# Patient Record
Sex: Female | Born: 1960 | Race: Black or African American | Hispanic: No | Marital: Married | Smoking: Never smoker
Health system: Southern US, Community
[De-identification: ages and names within clinical notes are randomized; demographics above are authoritative.]

## PROBLEM LIST (undated history)

## (undated) DIAGNOSIS — E559 Vitamin D deficiency, unspecified: Secondary | ICD-10-CM

## (undated) DIAGNOSIS — F419 Anxiety disorder, unspecified: Secondary | ICD-10-CM

## (undated) DIAGNOSIS — T7840XA Allergy, unspecified, initial encounter: Secondary | ICD-10-CM

## (undated) DIAGNOSIS — O2230 Deep phlebothrombosis in pregnancy, unspecified trimester: Secondary | ICD-10-CM

## (undated) DIAGNOSIS — F329 Major depressive disorder, single episode, unspecified: Secondary | ICD-10-CM

## (undated) DIAGNOSIS — I471 Supraventricular tachycardia, unspecified: Secondary | ICD-10-CM

## (undated) DIAGNOSIS — K219 Gastro-esophageal reflux disease without esophagitis: Secondary | ICD-10-CM

## (undated) DIAGNOSIS — I10 Essential (primary) hypertension: Secondary | ICD-10-CM

## (undated) DIAGNOSIS — K2270 Barrett's esophagus without dysplasia: Secondary | ICD-10-CM

## (undated) DIAGNOSIS — K5792 Diverticulitis of intestine, part unspecified, without perforation or abscess without bleeding: Secondary | ICD-10-CM

## (undated) DIAGNOSIS — R197 Diarrhea, unspecified: Secondary | ICD-10-CM

## (undated) DIAGNOSIS — I499 Cardiac arrhythmia, unspecified: Secondary | ICD-10-CM

## (undated) HISTORY — DX: Supraventricular tachycardia: I47.1

## (undated) HISTORY — DX: Diarrhea, unspecified: R19.7

## (undated) HISTORY — DX: Supraventricular tachycardia, unspecified: I47.10

## (undated) HISTORY — PX: CHOLECYSTECTOMY: SHX55

## (undated) HISTORY — DX: Anxiety disorder, unspecified: F41.9

## (undated) HISTORY — PX: OTHER SURGICAL HISTORY: SHX169

## (undated) HISTORY — DX: Vitamin D deficiency, unspecified: E55.9

## (undated) HISTORY — PX: ABDOMINAL HYSTERECTOMY: SHX81

## (undated) HISTORY — DX: Allergy, unspecified, initial encounter: T78.40XA

## (undated) HISTORY — DX: Major depressive disorder, single episode, unspecified: F32.9

## (undated) HISTORY — DX: Cardiac arrhythmia, unspecified: I49.9

## (undated) HISTORY — PX: WISDOM TOOTH EXTRACTION: SHX21

---

## 1998-09-11 ENCOUNTER — Encounter: Payer: Self-pay | Admitting: Internal Medicine

## 1998-09-11 ENCOUNTER — Ambulatory Visit (HOSPITAL_COMMUNITY): Admission: RE | Admit: 1998-09-11 | Discharge: 1998-09-11 | Payer: Self-pay | Admitting: Internal Medicine

## 1999-03-18 ENCOUNTER — Other Ambulatory Visit: Admission: RE | Admit: 1999-03-18 | Discharge: 1999-03-18 | Payer: Self-pay | Admitting: Internal Medicine

## 1999-04-09 ENCOUNTER — Emergency Department (HOSPITAL_COMMUNITY): Admission: EM | Admit: 1999-04-09 | Discharge: 1999-04-09 | Payer: Self-pay | Admitting: Emergency Medicine

## 1999-04-09 ENCOUNTER — Encounter: Payer: Self-pay | Admitting: Emergency Medicine

## 1999-09-15 ENCOUNTER — Ambulatory Visit (HOSPITAL_COMMUNITY): Admission: RE | Admit: 1999-09-15 | Discharge: 1999-09-15 | Payer: Self-pay | Admitting: Internal Medicine

## 1999-09-15 ENCOUNTER — Encounter: Payer: Self-pay | Admitting: Internal Medicine

## 1999-09-26 ENCOUNTER — Ambulatory Visit (HOSPITAL_COMMUNITY): Admission: RE | Admit: 1999-09-26 | Discharge: 1999-09-26 | Payer: Self-pay | Admitting: Emergency Medicine

## 1999-09-26 ENCOUNTER — Encounter: Payer: Self-pay | Admitting: Emergency Medicine

## 1999-11-27 ENCOUNTER — Encounter: Admission: RE | Admit: 1999-11-27 | Discharge: 1999-11-27 | Payer: Self-pay | Admitting: Internal Medicine

## 1999-11-27 ENCOUNTER — Encounter: Payer: Self-pay | Admitting: Internal Medicine

## 1999-12-18 ENCOUNTER — Encounter: Payer: Self-pay | Admitting: Gastroenterology

## 1999-12-18 ENCOUNTER — Ambulatory Visit (HOSPITAL_COMMUNITY): Admission: RE | Admit: 1999-12-18 | Discharge: 1999-12-18 | Payer: Self-pay | Admitting: Gastroenterology

## 1999-12-22 ENCOUNTER — Encounter (INDEPENDENT_AMBULATORY_CARE_PROVIDER_SITE_OTHER): Payer: Self-pay | Admitting: *Deleted

## 1999-12-22 ENCOUNTER — Ambulatory Visit (HOSPITAL_COMMUNITY): Admission: RE | Admit: 1999-12-22 | Discharge: 1999-12-22 | Payer: Self-pay | Admitting: Gastroenterology

## 2000-02-04 ENCOUNTER — Ambulatory Visit (HOSPITAL_COMMUNITY): Admission: RE | Admit: 2000-02-04 | Discharge: 2000-02-04 | Payer: Self-pay | Admitting: Gastroenterology

## 2000-02-04 ENCOUNTER — Encounter (INDEPENDENT_AMBULATORY_CARE_PROVIDER_SITE_OTHER): Payer: Self-pay | Admitting: *Deleted

## 2000-09-16 ENCOUNTER — Ambulatory Visit (HOSPITAL_COMMUNITY): Admission: RE | Admit: 2000-09-16 | Discharge: 2000-09-16 | Payer: Self-pay | Admitting: Internal Medicine

## 2000-09-16 ENCOUNTER — Encounter: Payer: Self-pay | Admitting: Internal Medicine

## 2001-02-21 ENCOUNTER — Encounter: Payer: Self-pay | Admitting: Emergency Medicine

## 2001-02-21 ENCOUNTER — Inpatient Hospital Stay (HOSPITAL_COMMUNITY): Admission: EM | Admit: 2001-02-21 | Discharge: 2001-02-21 | Payer: Self-pay | Admitting: Emergency Medicine

## 2001-05-05 ENCOUNTER — Other Ambulatory Visit: Admission: RE | Admit: 2001-05-05 | Discharge: 2001-05-05 | Payer: Self-pay | Admitting: Obstetrics and Gynecology

## 2001-09-30 ENCOUNTER — Ambulatory Visit (HOSPITAL_COMMUNITY): Admission: RE | Admit: 2001-09-30 | Discharge: 2001-09-30 | Payer: Self-pay | Admitting: Internal Medicine

## 2001-09-30 ENCOUNTER — Encounter: Payer: Self-pay | Admitting: Internal Medicine

## 2002-02-22 ENCOUNTER — Encounter: Payer: Self-pay | Admitting: Emergency Medicine

## 2002-02-22 ENCOUNTER — Emergency Department (HOSPITAL_COMMUNITY): Admission: EM | Admit: 2002-02-22 | Discharge: 2002-02-22 | Payer: Self-pay | Admitting: Emergency Medicine

## 2002-04-13 ENCOUNTER — Encounter: Payer: Self-pay | Admitting: General Surgery

## 2002-04-13 ENCOUNTER — Encounter: Admission: RE | Admit: 2002-04-13 | Discharge: 2002-04-13 | Payer: Self-pay | Admitting: General Surgery

## 2002-04-21 ENCOUNTER — Encounter: Payer: Self-pay | Admitting: General Surgery

## 2002-04-21 ENCOUNTER — Encounter (INDEPENDENT_AMBULATORY_CARE_PROVIDER_SITE_OTHER): Payer: Self-pay | Admitting: *Deleted

## 2002-04-21 ENCOUNTER — Observation Stay (HOSPITAL_COMMUNITY): Admission: RE | Admit: 2002-04-21 | Discharge: 2002-04-21 | Payer: Self-pay | Admitting: General Surgery

## 2002-10-02 ENCOUNTER — Ambulatory Visit (HOSPITAL_COMMUNITY): Admission: RE | Admit: 2002-10-02 | Discharge: 2002-10-02 | Payer: Self-pay | Admitting: Family Medicine

## 2002-10-02 ENCOUNTER — Encounter: Payer: Self-pay | Admitting: Family Medicine

## 2003-10-03 ENCOUNTER — Ambulatory Visit (HOSPITAL_COMMUNITY): Admission: RE | Admit: 2003-10-03 | Discharge: 2003-10-03 | Payer: Self-pay | Admitting: Family Medicine

## 2003-10-25 ENCOUNTER — Emergency Department (HOSPITAL_COMMUNITY): Admission: EM | Admit: 2003-10-25 | Discharge: 2003-10-25 | Payer: Self-pay | Admitting: Emergency Medicine

## 2004-10-03 ENCOUNTER — Ambulatory Visit (HOSPITAL_COMMUNITY): Admission: RE | Admit: 2004-10-03 | Discharge: 2004-10-03 | Payer: Self-pay | Admitting: Family Medicine

## 2004-10-08 ENCOUNTER — Ambulatory Visit (HOSPITAL_COMMUNITY): Admission: RE | Admit: 2004-10-08 | Discharge: 2004-10-08 | Payer: Self-pay | Admitting: Family Medicine

## 2007-02-26 ENCOUNTER — Emergency Department (HOSPITAL_COMMUNITY): Admission: EM | Admit: 2007-02-26 | Discharge: 2007-02-26 | Payer: Self-pay | Admitting: Emergency Medicine

## 2007-03-06 ENCOUNTER — Emergency Department: Payer: Self-pay | Admitting: Emergency Medicine

## 2007-03-24 ENCOUNTER — Ambulatory Visit: Payer: Self-pay | Admitting: Internal Medicine

## 2007-04-20 ENCOUNTER — Ambulatory Visit: Payer: Self-pay | Admitting: Internal Medicine

## 2007-05-03 ENCOUNTER — Ambulatory Visit: Payer: Self-pay | Admitting: Internal Medicine

## 2007-07-08 ENCOUNTER — Ambulatory Visit: Payer: Self-pay | Admitting: Gastroenterology

## 2008-02-26 ENCOUNTER — Emergency Department: Payer: Self-pay | Admitting: Emergency Medicine

## 2008-02-26 ENCOUNTER — Emergency Department (HOSPITAL_COMMUNITY): Admission: EM | Admit: 2008-02-26 | Discharge: 2008-02-27 | Payer: Self-pay | Admitting: Emergency Medicine

## 2008-07-11 ENCOUNTER — Ambulatory Visit: Payer: Self-pay | Admitting: Internal Medicine

## 2008-08-07 ENCOUNTER — Ambulatory Visit: Payer: Self-pay | Admitting: Internal Medicine

## 2008-08-17 ENCOUNTER — Other Ambulatory Visit: Payer: Self-pay | Admitting: Internal Medicine

## 2008-08-27 ENCOUNTER — Ambulatory Visit: Payer: Self-pay | Admitting: Internal Medicine

## 2008-09-05 ENCOUNTER — Ambulatory Visit: Payer: Self-pay | Admitting: Internal Medicine

## 2008-09-06 ENCOUNTER — Ambulatory Visit: Payer: Self-pay | Admitting: Internal Medicine

## 2008-09-14 ENCOUNTER — Ambulatory Visit: Payer: Self-pay | Admitting: Internal Medicine

## 2008-10-07 ENCOUNTER — Ambulatory Visit: Payer: Self-pay | Admitting: Internal Medicine

## 2009-02-25 ENCOUNTER — Ambulatory Visit: Payer: Self-pay | Admitting: Internal Medicine

## 2009-03-09 HISTORY — PX: COLONOSCOPY: SHX174

## 2009-04-11 ENCOUNTER — Ambulatory Visit: Payer: Self-pay | Admitting: Internal Medicine

## 2009-05-07 ENCOUNTER — Ambulatory Visit: Payer: Self-pay | Admitting: Internal Medicine

## 2009-05-11 ENCOUNTER — Emergency Department: Payer: Self-pay | Admitting: Emergency Medicine

## 2009-06-22 ENCOUNTER — Emergency Department (HOSPITAL_COMMUNITY): Admission: EM | Admit: 2009-06-22 | Discharge: 2009-06-22 | Payer: Self-pay | Admitting: Emergency Medicine

## 2009-08-28 ENCOUNTER — Ambulatory Visit: Payer: Self-pay | Admitting: Internal Medicine

## 2009-09-18 ENCOUNTER — Ambulatory Visit: Payer: Self-pay | Admitting: Internal Medicine

## 2009-11-07 ENCOUNTER — Ambulatory Visit: Payer: Self-pay | Admitting: Internal Medicine

## 2009-12-03 ENCOUNTER — Emergency Department (HOSPITAL_COMMUNITY): Admission: EM | Admit: 2009-12-03 | Discharge: 2009-12-03 | Payer: Self-pay | Admitting: Emergency Medicine

## 2009-12-06 ENCOUNTER — Ambulatory Visit: Payer: Self-pay | Admitting: Internal Medicine

## 2009-12-07 ENCOUNTER — Ambulatory Visit: Payer: Self-pay | Admitting: Internal Medicine

## 2009-12-13 ENCOUNTER — Ambulatory Visit: Payer: Self-pay

## 2010-03-09 HISTORY — PX: BREAST BIOPSY: SHX20

## 2010-05-22 LAB — POCT I-STAT, CHEM 8
BUN: 10 mg/dL (ref 6–23)
Calcium, Ion: 1.21 mmol/L (ref 1.12–1.32)
Chloride: 104 mEq/L (ref 96–112)
Creatinine, Ser: 0.9 mg/dL (ref 0.4–1.2)
Glucose, Bld: 108 mg/dL — ABNORMAL HIGH (ref 70–99)
HCT: 45 % (ref 36.0–46.0)
Hemoglobin: 15.3 g/dL — ABNORMAL HIGH (ref 12.0–15.0)
Potassium: 3.4 mEq/L — ABNORMAL LOW (ref 3.5–5.1)
Sodium: 141 mEq/L (ref 135–145)
TCO2: 25 mmol/L (ref 0–100)

## 2010-05-22 LAB — POCT CARDIAC MARKERS
CKMB, poc: <1 ng/mL — ABNORMAL LOW (ref 1.0–8.0)
Myoglobin, poc: 69.2 ng/mL (ref 12–200)
Troponin i, poc: <0.05 ng/mL (ref 0.00–0.09)

## 2010-05-27 LAB — COMPREHENSIVE METABOLIC PANEL
ALT: 17 U/L (ref 0–35)
AST: 24 U/L (ref 0–37)
Albumin: 3.6 g/dL (ref 3.5–5.2)
Alkaline Phosphatase: 53 U/L (ref 39–117)
BUN: 9 mg/dL (ref 6–23)
CO2: 27 mEq/L (ref 19–32)
Calcium: 8.8 mg/dL (ref 8.4–10.5)
Chloride: 107 mEq/L (ref 96–112)
Creatinine, Ser: 0.81 mg/dL (ref 0.4–1.2)
GFR calc Af Amer: >60 mL/min (ref >60)
GFR calc non Af Amer: >60 mL/min (ref >60)
Glucose, Bld: 110 mg/dL — ABNORMAL HIGH (ref 70–99)
Potassium: 3.7 mEq/L (ref 3.5–5.1)
Sodium: 137 mEq/L (ref 135–145)
Total Bilirubin: 0.3 mg/dL (ref 0.3–1.2)
Total Protein: 7.5 g/dL (ref 6.0–8.3)

## 2010-05-27 LAB — LIPASE, BLOOD: Lipase: 38 U/L (ref 11–59)

## 2010-07-25 NOTE — H&P (Signed)
[facility]  Patient:    JOCI, DRESS Visit Number: [id number] MRN: [id number]          Service Type: MED Location: 3W [id number] 2006 [id number] Attending Physician:  Rudean Hitt Dictated by:   Clovis Pu Patty Sermons, M.D. Admit Date:  02/20/2001   CC:         Merlene Laughter. Renae Gloss, M.D.  Anselmo Rod, M.D.   History and Physical  CHIEF COMPLAINT:  Chest pain.  HISTORY:  This is a 50 year old married black female who is admitted with chest pain through the emergency room.  She had the onset of pain at about 10 p.m. and came to the emergency room around midnight.  She described the discomfort as substernal heaviness which was not pleuritic.  There was radiation to the jaw and to the left shoulder blade and into the left arm. There was associated nausea but no vomiting and no diaphoresis and she was not short of breath.  When she came to the emergency room, her electrocardiogram was normal.  She was given three nitroglycerin with relief of pain.  She does have a past history of known hiatal hernia and peptic ulcer.  She has had a past history of negative Holter monitor.  She has never had stress test or cath.  She saw Dr. Darci Needle in about 1996 and he felt her pain at that time was noncardiac.  She saw Dr. Julieanne Manson two or three years ago and had Holter monitoring which was negative as part of her workup of palpitations.  She does not occasional chest tightness with walking.  She has had no recent palpitations.  She does have a history of high blood pressure and has been on a dose of atenolol 25 mg daily and hydrochlorothiazide 25 mg/triamterene 37.5 mg one daily.  FAMILY HISTORY:  Her family history reveals that her mother died of pneumonia and father died of cancer.  She has a brother with heart failure.  SOCIAL HISTORY:  She is married.  She has two children.  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  She quit smoking  in the early 1980s.  She drinks occasional beer.  ALLERGIES:  She develops hives if she takes very much ASPIRIN.  PAST MEDICAL HISTORY:  Hysterectomy.  REVIEW OF SYSTEMS:  GASTROINTESTINAL:  She has a history of reflux.  She takes Tagamet over the counter p.r.n.  She has had normal bowel movements. GENITOURINARY:  Negative.  NEUROLOGIC:  History of migraine and she takes over-the-counter medications including occasional Goodys powders.  PHYSICAL EXAMINATION:  VITAL SIGNS:  Blood pressure is 110/80.  Pulse is 80, regular.  Respirations are normal.  Her weight is about 240 pounds.  GENERAL:  General appearance revealed a well-developed, well-nourished woman in no distress.  HEENT/NECK:  Negative.  Carotids negative.  Pupils are equal and reactive. Extraocular movements are full.  Mouth and pharynx normal.  The thyroid is not enlarged.  There is no lymphadenopathy.  CHEST:  Clear.  The thorax reveals no spine tenderness.  She does have mild left anterior chest wall tenderness.  HEART:  Quiet precordium without murmur, gallop or rub.  ABDOMEN:  Soft, nontender.  Liver not enlarged.  EXTREMITIES:  Good peripheral pulses.  No phlebitis or edema.  LABORATORY DATA:  Electrocardiogram is normal x 2 two hours apart and we are awaiting the third EKG.  X-ray results are pending.  Laboratory studies so far show  CK-MB of 4.2, troponin of 0.02, potassium is borderline low at 3.5.  Liver function studies are normal.  IMPRESSION: 1. Chest pain, rule out myocardial infarction. 2. History of hiatal hernia with reflux. 3. Hypertensive cardiovascular disease. 4. Exogenous obesity.  DISPOSITION:  We are awaiting a second set of enzymes drawn 12 hours after admission.  If negative, we will probably be able to discharge later today and work her up as an outpatient with a two-day Cardiolite stress test. Dictated by:   Clovis Pu. Patty Sermons, M.D. Attending Physician:  Rudean Hitt DD:  02/21/01 TD:  02/21/01 Job: [id number]

## 2010-07-25 NOTE — Op Note (Signed)
NAME:  Yolanda Brown, Yolanda Brown NO.:  [id number]   MEDICAL RECORD NO.:  [id number]                   PATIENT TYPE:  AMB   LOCATION:  DAY                                  FACILITY:  [facility]   PHYSICIAN:  Adolph Pollack, M.D.            DATE OF BIRTH:  09/12/1960   DATE OF PROCEDURE:  04/21/2002  DATE OF DISCHARGE:                                 OPERATIVE REPORT   PREOPERATIVE DIAGNOSIS:  Symptomatic cholelithiasis.   POSTOPERATIVE DIAGNOSIS:  Chronic calculous cholecystitis.   OPERATION PERFORMED:  Laparoscopic cholecystectomy with intraoperative  cholangiogram.   SURGEON:  Jefry H. Pollyann Kennedy, M.D.   ASSISTANT:  Currie Paris, M.D.   ANESTHESIA:  General.   INDICATIONS FOR PROCEDURE:  The patient is a 50 year old female with very  classic biliary colic and known gallstones.  She presents now for elective  laparoscopic cholecystectomy.  The procedure and the risks were explained to  her preoperatively.   DESCRIPTION OF PROCEDURE:  The patient was seen in the holding room and  brought to the operating room.  Placed supine on the operating table and a  general anesthetic was administered.  Her abdomen was then sterilely prepped  and draped.  Dilute Marcaine was infiltrated  in the subumbilical region and  a small subumbilical incision was made through the skin and subcutaneous  tissue until the fascia was exposed.  A 1.5 cm incision was made in the  midline fascia and a pursestring suture of 0 Vicryl placed around the  fascial edges.  The peritoneal cavity was then entered bluntly and under  direct vision.  A Hasson trocar was introduced into the peritoneal cavity  and a pneumoperitoneum was created by insufflation of CO2 gas.   Next she was placed in a reversed Trendelenburg position and a laparoscope  was introduced.  I examined the area directly below the trocar and no bowel  injury was noted.  An 11 mm trocar was placed in the epigastric  incision and  two 5 mm trocars placed through the right midlateral abdomen.  She was then  tilted slightly right side up.  The fundus of the gallbladder was grasped  and adhesions between the omentum and the body of the gallbladder were  noted.  There were filmy adhesions between the duodenum and the infundibulum  of the gallbladder.  These adhesions were taken down bluntly and sharply  without cautery.  Subsequently, I mobilized the infundibulum and was able to  isolate the cystic duct and the cystic artery and create windows around  them.  I placed a clip at the cystic duct gallbladder junction and made an  incision in the cystic duct.  A cholangiocatheter was passed through the  anterior abdominal wall into the cystic duct and a cholangiogram was then  performed.   Under real time fluoroscopy, dilute contrast material was injected into the  cystic duct.  The  common hepatic, right and left hepatic and common bile  ducts all filled promptly.  The common bile duct drained into the duodenum  without obvious evidence of obstruction.  Final report is pending the  radiologist's interpretation.   The Cholangiocath was then removed, the cystic duct was then clipped three  times proximally and divided.  The cystic artery was clipped and divided.  The gallbladder was then dissected free from the liver bed using the  cautery.  She had a number of small blood vessels going directly into the  gallbladder which required clips on the staying end side.  Once I had  removed the gallbladder, I irrigated out the hepatic bed and bleeding points  were controlled with the cautery.  I then examined the area multiple times  and noted no further bleeding or bile leakage.   The gallbladder was then removed through the subumbilical port and the  fascial defect was closed under laparoscopic vision by tightening up and  tying down the pursestring suture.  The perihepatic area again irrigated and  inspected and  no bleeding or bile leak was noted and the fluid was  evacuated.  The remaining trocars were removed and the peritoneum was  released.   The skin incisions were closed with 4-0 Monocryl subcuticular stitches.  Steri-Strips and sterile dressings were applied.  The patient tolerated the  procedure well without any apparent complications and was taken to the  recovery room in satisfactory condition.                                                Adolph Pollack, M.D.    Kari Baars  D:  04/21/2002  T:  04/21/2002  Job:  [id number]   cc:   Anselmo Rod, M.D.  [address]  [address]  [place]  [place] [address]  Fax: [phone]   Merlene Laughter. Renae Gloss, M.D.  [address]  [address]  [place]  [place] [address]  Fax: [phone]

## 2010-07-25 NOTE — Procedures (Signed)
[facility]  Patient:    Yolanda Brown, Yolanda Brown                        MRN: [id number] Proc. Date: 02/04/00 Adm. Date:  [id number] Attending:  Charna Elizabeth CC:         Adolph Pollack, M.D.  Merlene Laughter. Renae Gloss, M.D.   Procedure Report  DATE OF BIRTH:  [birth year]  PROCEDURE:  Colonoscopy.  ENDOSCOPIST:  Anselmo Rod, M.D.  INSTRUMENT USED:  Olympus video colonoscope.  INDICATIONS:  Left lower quadrant pain with previous history of diverticulitis and guaiac-positive stools in a 50 year old African-American female.  Rule out colonic polyps, masses, hemorrhoids, etc.  PREPROCEDURE PREPARATION:  Informed consent was procured from the patient. The patient was fasted for eight hours prior to the procedure and prepped with a bottle of magnesium citrate and a gallon of NuLytely the night prior to the procedure.  PREPROCEDURE PHYSICAL EXAMINATION:  VITAL SIGNS:  The patient has stable vital signs.  NECK:  Supple.  CHEST:  Clear to auscultation.  HEART:  S1 and S2 regular.  ABDOMEN:  Soft with normal abdominal bowel sounds.  DESCRIPTION OF PROCEDURE:  The patient was placed in the left lateral decubitus position and sedated with additional 1 mg Versed intravenously. Once the patient was adequately sedated and maintained on low flow oxygen and continuous cardiac monitoring, the Olympus video colonoscope was advanced from the rectum to the cecum without difficulty.  There was extensive left sided diverticular disease.  The cecum, right colon and transverse colon appeared normal.  No masses or polyps were seen.  IMPRESSION: 1. Extensive left-sided diverticulosis.  No masses or polyps seen. 2. Normal-appearing cecum, right colon, transverse colon.  RECOMMENDATIONS:  The patient has been advised to increase the fluid and fiber in her diet and follow-up in the office in the next two weeks. DD:  02/04/00 TD:  02/04/00 Job:  [id number]

## 2010-07-25 NOTE — Op Note (Signed)
[facility]  Patient:    Yolanda Brown, Yolanda Brown                        MRN: [id number] Proc. Date: 02/04/00 Adm. Date:  [id number] Attending:  Charna Elizabeth CC:         Merlene Laughter. Renae Gloss, M.D.  Adolph Pollack, M.D.   Operative Report  DATE OF BIRTH:  20-Apr-1960.  PROCEDURE PERFORMED:  Esophagogastroduodenoscopy with biopsies.  ENDOSCOPIST:  Anselmo Rod, M.D.  INSTRUMENT USED:  Olympus video panendoscope.  INDICATION FOR PROCEDURE:  Abnormal antral fold with severe reflux and guaiac-positive stool in a 50 year old African-American female to give with PPIs.  Rule out persistent other fold worsening versus ulcer disease.  PREPROCEDURE PREPARATION:  Informed consent was procured from the patient. The patient was fasted for eight hours prior to the procedure.  PREPROCEDURE PHYSICAL:  VITAL SIGNS:  The patient had stable vital signs.  NECK:  Supple.  CHEST:  Clear to auscultation.  S1, S2 regular.  ABDOMEN:  Soft, with normal abdominal bowel sounds.  DESCRIPTION OF PROCEDURE:  The patient was placed in the left lateral decubitus position and sedated with 90 mcg of Fentanyl and 10 mg of Versed intravenously.  Once the patient was adequate sedate and maintained on low-flow oxygen and continuous cardiac monitoring, the Olympus panendoscope was advanced through the mouthpiece over the tongue into the esophagus under direct vision.  The entire esophagus appeared normal; however, there was active reflux with some residual debris from the stomach into the distal esophagus throughout the procedure.  There was a definite decrease in the size of the prominent fold in the prepyloric area; however, there was some nodularity noticed, and this persistent nodularity was biopsied for pathology. There was a large hiatal hernia seen on retroflexion.  IMPRESSION: 1. Significant decrease in the size of the prepyloric fold compared to the    recent previous  endoscopy. 2. No ulceration seen. 3. Normal proximal small bowel.  RECOMMENDATIONS: 1. Await for pathology results from biopsy done from the nodularity in the    prepyloric area. 2. Follow anti-reflux measures. 3. Continue PPIs for now. 4. A 24-hour period study with esophagomenometry to evaluate the patient for    Nissan fundoplication. 5. Outpatient follow-up in the next two weeks. 6. Proceed with colonoscopy at this time. DD:  02/04/00 TD:  02/04/00 Job: [id number]

## 2010-09-29 ENCOUNTER — Ambulatory Visit: Payer: Self-pay | Admitting: Internal Medicine

## 2010-10-01 ENCOUNTER — Ambulatory Visit: Payer: Self-pay | Admitting: Internal Medicine

## 2010-10-02 ENCOUNTER — Other Ambulatory Visit: Payer: Self-pay | Admitting: Internal Medicine

## 2010-11-17 ENCOUNTER — Ambulatory Visit: Payer: Self-pay | Admitting: Surgery

## 2010-11-19 LAB — PATHOLOGY REPORT

## 2010-11-26 ENCOUNTER — Other Ambulatory Visit: Payer: Self-pay | Admitting: Gastroenterology

## 2010-12-08 ENCOUNTER — Ambulatory Visit: Payer: Self-pay | Admitting: Internal Medicine

## 2010-12-11 LAB — URINALYSIS, ROUTINE W REFLEX MICROSCOPIC
Bilirubin Urine: NEGATIVE
Glucose, UA: NEGATIVE mg/dL
Hgb urine dipstick: NEGATIVE
Ketones, ur: NEGATIVE mg/dL
Nitrite: NEGATIVE
Protein, ur: NEGATIVE mg/dL
Specific Gravity, Urine: 1.024 (ref 1.005–1.030)
Urobilinogen, UA: 0.2 mg/dL (ref 0.0–1.0)
pH: 6 (ref 5.0–8.0)

## 2010-12-11 LAB — DIFFERENTIAL
Basophils Absolute: 0.1 10*3/uL (ref 0.0–0.1)
Basophils Relative: 2 % — ABNORMAL HIGH (ref 0–1)
Eosinophils Absolute: 0.3 10*3/uL (ref 0.0–0.7)
Eosinophils Relative: 4 % (ref 0–5)
Lymphocytes Relative: 32 % (ref 12–46)
Lymphs Abs: 2.5 10*3/uL (ref 0.7–4.0)
Monocytes Absolute: 0.5 10*3/uL (ref 0.1–1.0)
Monocytes Relative: 7 % (ref 3–12)
Neutro Abs: 4.4 10*3/uL (ref 1.7–7.7)
Neutrophils Relative %: 56 % (ref 43–77)

## 2010-12-11 LAB — CBC
HCT: 40.7 % (ref 36.0–46.0)
Hemoglobin: 13.1 g/dL (ref 12.0–15.0)
MCHC: 32.1 g/dL (ref 30.0–36.0)
MCV: 90.1 fL (ref 78.0–100.0)
Platelets: 310 10*3/uL (ref 150–400)
RBC: 4.52 MIL/uL (ref 3.87–5.11)
RDW: 15 % (ref 11.5–15.5)
WBC: 7.8 10*3/uL (ref 4.0–10.5)

## 2010-12-11 LAB — LIPASE, BLOOD: Lipase: 26 U/L (ref 11–59)

## 2010-12-11 LAB — COMPREHENSIVE METABOLIC PANEL
ALT: 20 U/L (ref 0–35)
AST: 29 U/L (ref 0–37)
Albumin: 3.5 g/dL (ref 3.5–5.2)
Alkaline Phosphatase: 40 U/L (ref 39–117)
BUN: 8 mg/dL (ref 6–23)
CO2: 25 mEq/L (ref 19–32)
Calcium: 8.9 mg/dL (ref 8.4–10.5)
Chloride: 106 mEq/L (ref 96–112)
Creatinine, Ser: 0.91 mg/dL (ref 0.4–1.2)
GFR calc Af Amer: >60 mL/min (ref >60)
GFR calc non Af Amer: >60 mL/min (ref >60)
Glucose, Bld: 95 mg/dL (ref 70–99)
Potassium: 3.8 mEq/L (ref 3.5–5.1)
Sodium: 139 mEq/L (ref 135–145)
Total Bilirubin: 0.7 mg/dL (ref 0.3–1.2)
Total Protein: 6.9 g/dL (ref 6.0–8.3)

## 2010-12-11 LAB — OCCULT BLOOD X 1 CARD TO LAB, STOOL: Fecal Occult Bld: NEGATIVE

## 2010-12-12 ENCOUNTER — Ambulatory Visit: Payer: Self-pay | Admitting: Gastroenterology

## 2010-12-12 LAB — COMPREHENSIVE METABOLIC PANEL
ALT: 21
AST: 31
Albumin: 3.7
Alkaline Phosphatase: 43
BUN: 10
CO2: 30
Calcium: 9.6
Chloride: 100
Creatinine, Ser: 0.9
GFR calc Af Amer: >60
GFR calc non Af Amer: >60
Glucose, Bld: 114 — ABNORMAL HIGH
Potassium: 3.7
Sodium: 139
Total Bilirubin: 1.2
Total Protein: 8

## 2010-12-12 LAB — CBC
HCT: 41.9
Hemoglobin: 14.2
MCHC: 34
MCV: 85.1
Platelets: 282
RBC: 4.93
RDW: 14.1
WBC: 8.7

## 2010-12-12 LAB — URINALYSIS, ROUTINE W REFLEX MICROSCOPIC
Bilirubin Urine: NEGATIVE
Glucose, UA: NEGATIVE
Hgb urine dipstick: NEGATIVE
Ketones, ur: NEGATIVE
Nitrite: NEGATIVE
Protein, ur: NEGATIVE
Specific Gravity, Urine: 1.018
Urobilinogen, UA: 0.2
pH: 5.5

## 2010-12-12 LAB — DIFFERENTIAL
Basophils Absolute: 0
Basophils Relative: 1
Eosinophils Absolute: 0.4
Eosinophils Relative: 5
Lymphocytes Relative: 32
Lymphs Abs: 2.8
Monocytes Absolute: 0.8
Monocytes Relative: 9
Neutro Abs: 4.7
Neutrophils Relative %: 54

## 2010-12-12 LAB — LIPASE, BLOOD: Lipase: 32

## 2010-12-16 LAB — PATHOLOGY REPORT

## 2010-12-26 ENCOUNTER — Ambulatory Visit: Payer: Self-pay | Admitting: Gastroenterology

## 2010-12-31 IMAGING — CR DG SHOULDER 2+V*L*
4 series · 4 of 4 positions shown · non-contrast
Comparison: None.

CLINICAL DATA: Left shoulder pain

LEFT SHOULDER - 2+ VIEW

[w shoulder ap internal left *]
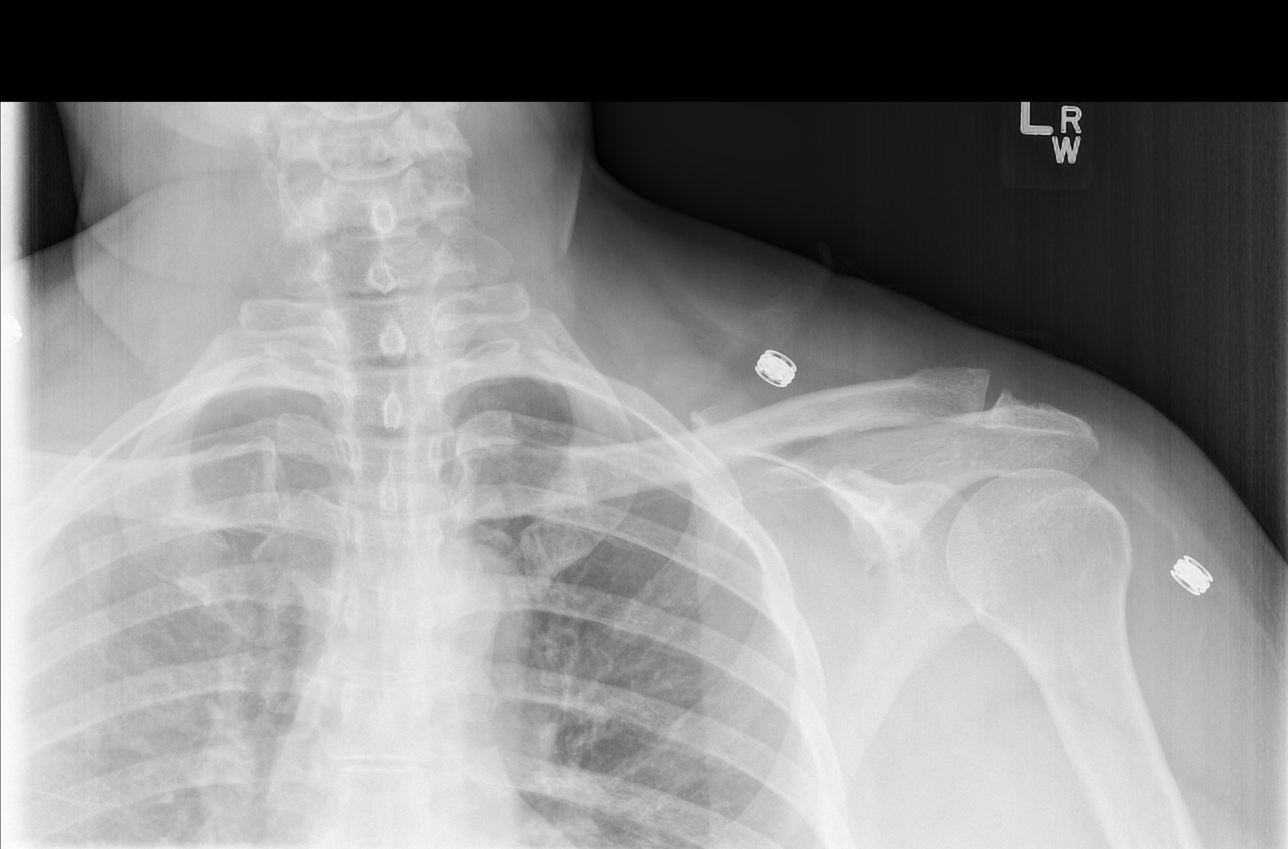

[w shoulder ap external left *]
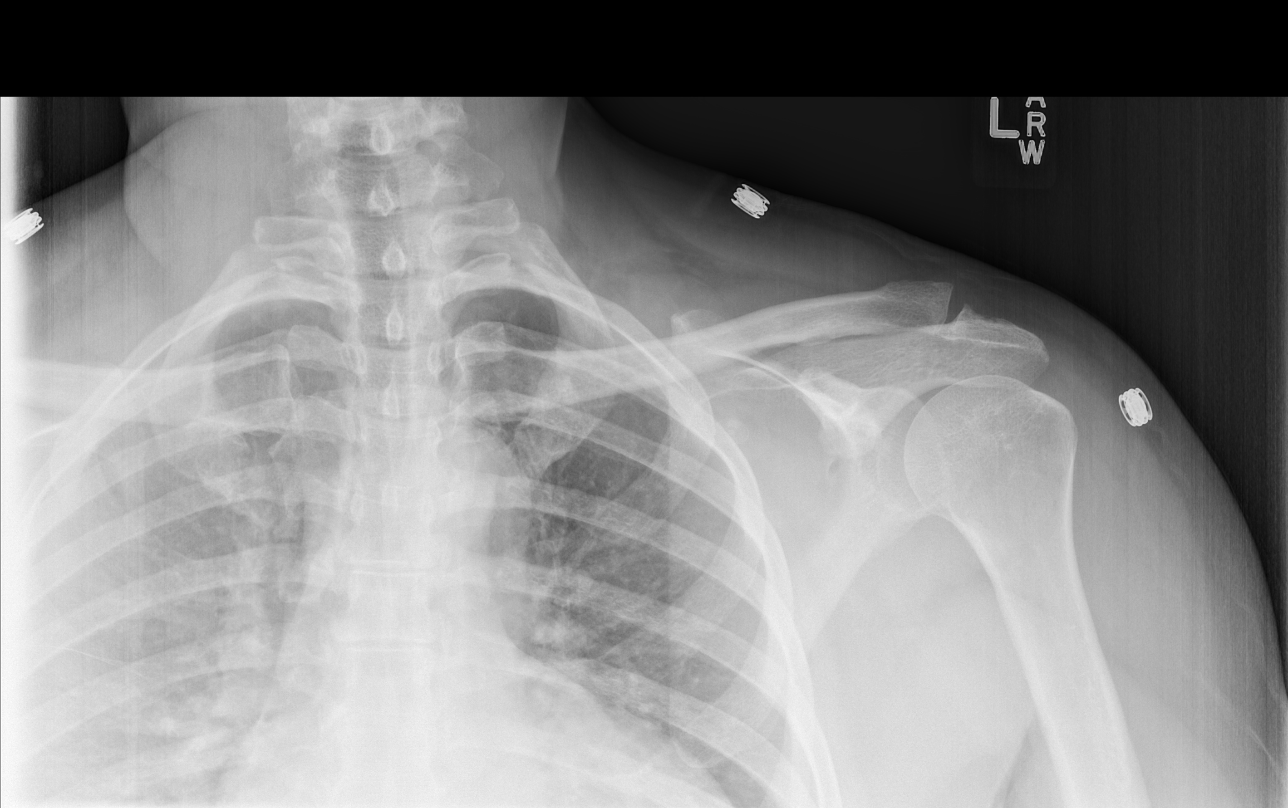

[w shoulder y view left * (1 of 2)]
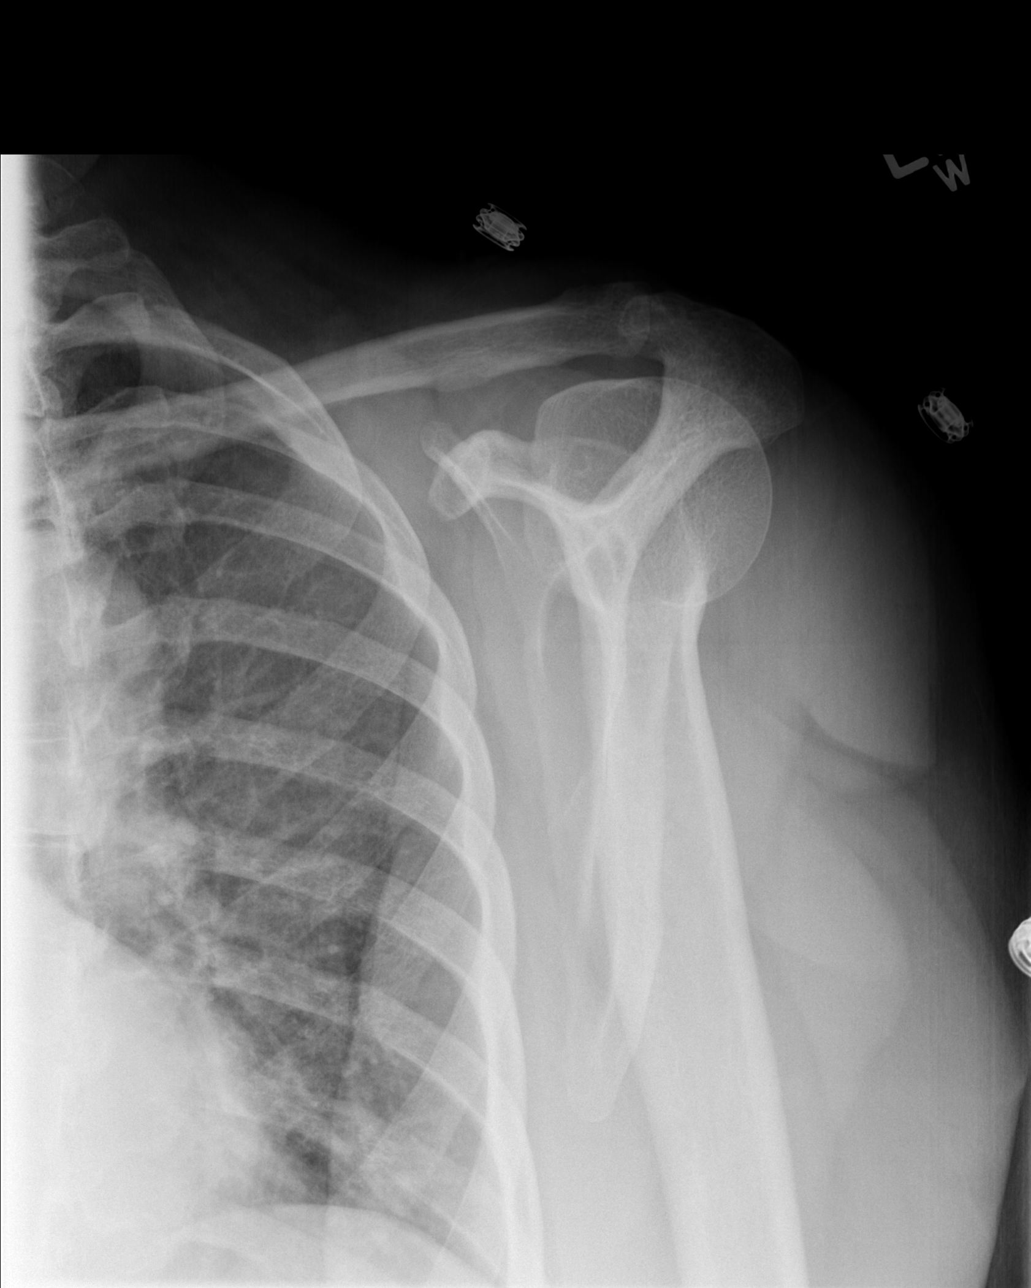

[w shoulder y view left * (2 of 2)]
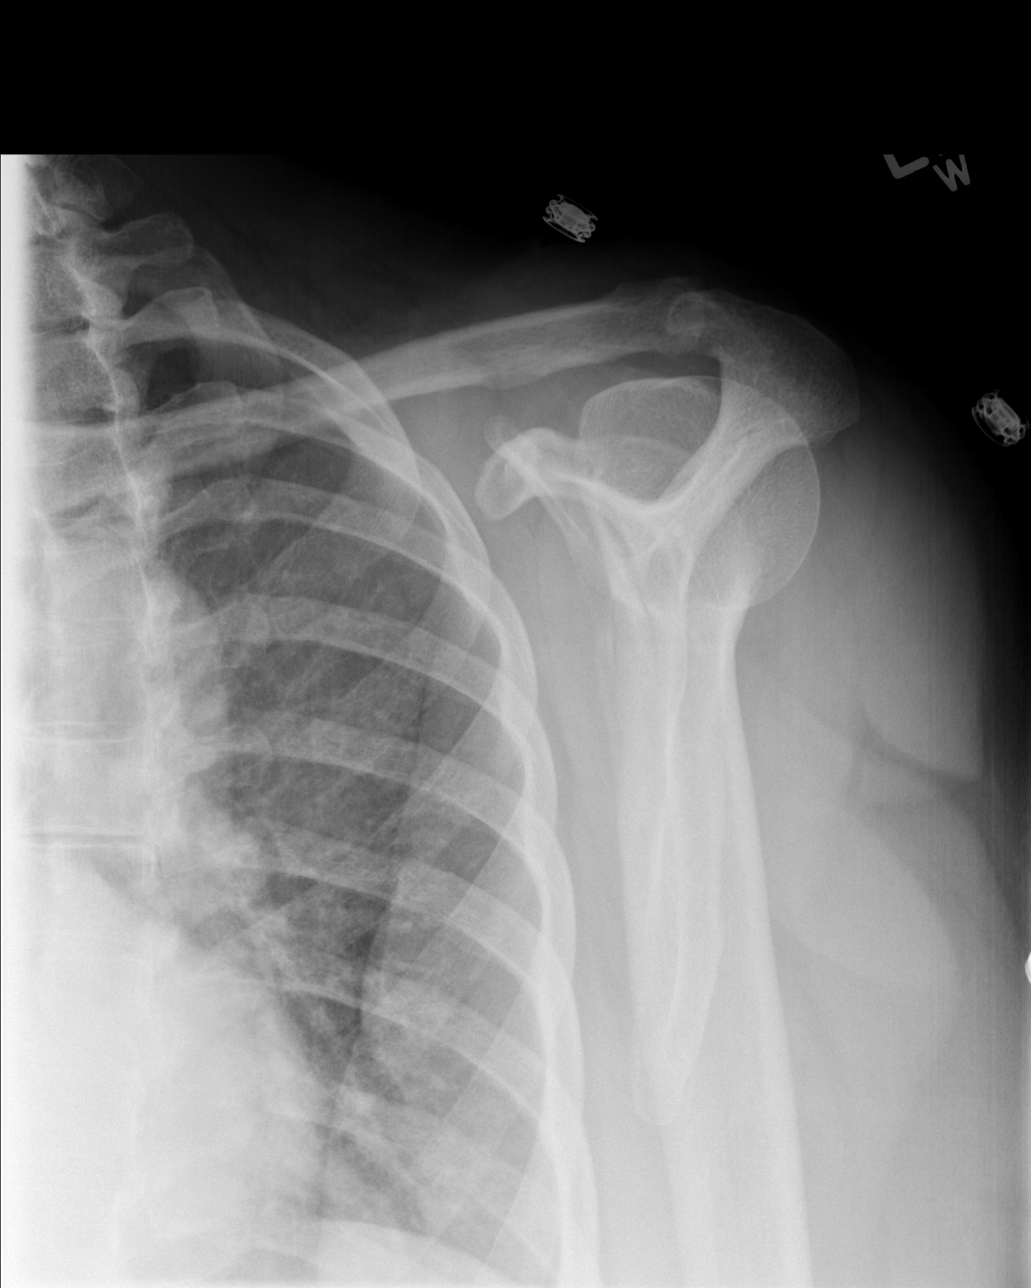

[4 of 4 positions shown; findings below may reference images not displayed]

FINDINGS: Normal alignment.  Negative for fracture.  Unremarkable
AC joint.
IMPRESSION: No acute finding.

## 2011-01-08 ENCOUNTER — Ambulatory Visit: Payer: Self-pay | Admitting: Internal Medicine

## 2011-03-10 DIAGNOSIS — F32A Depression, unspecified: Secondary | ICD-10-CM

## 2011-03-10 DIAGNOSIS — F419 Anxiety disorder, unspecified: Secondary | ICD-10-CM | POA: Insufficient documentation

## 2011-03-10 HISTORY — DX: Depression, unspecified: F32.A

## 2011-03-10 HISTORY — DX: Anxiety disorder, unspecified: F41.9

## 2011-05-03 ENCOUNTER — Encounter (HOSPITAL_COMMUNITY): Payer: Self-pay | Admitting: Adult Health

## 2011-05-03 ENCOUNTER — Emergency Department (HOSPITAL_COMMUNITY)
Admission: EM | Admit: 2011-05-03 | Discharge: 2011-05-03 | Discharge status: Against Medical Advice | Payer: PRIVATE HEALTH INSURANCE | Attending: Emergency Medicine | Admitting: Emergency Medicine

## 2011-05-03 ENCOUNTER — Emergency Department (HOSPITAL_COMMUNITY): Payer: PRIVATE HEALTH INSURANCE

## 2011-05-03 DIAGNOSIS — R059 Cough, unspecified: Secondary | ICD-10-CM | POA: Insufficient documentation

## 2011-05-03 DIAGNOSIS — R05 Cough: Secondary | ICD-10-CM

## 2011-05-03 HISTORY — DX: Gastro-esophageal reflux disease without esophagitis: K21.9

## 2011-05-03 HISTORY — DX: Essential (primary) hypertension: I10

## 2011-05-03 NOTE — ED Notes (Signed)
C/o cough for three weeks that is dry and at times productive as well. Finished a zpack and using pro air inhaler with no relief.

## 2011-05-04 NOTE — ED Provider Notes (Signed)
I did not evaluate this patient.  This patient was triaged and brought back to around.  She decided to leave prior to my evaluation.  I did not perform a history or physical on this patient  Lyanne Co, MD 05/04/11 [phone]

## 2011-07-29 ENCOUNTER — Ambulatory Visit: Payer: Self-pay | Admitting: Bariatrics

## 2011-07-29 LAB — IRON AND TIBC
Iron Bind.Cap.(Total): 373 ug/dL (ref 250–450)
Iron Saturation: 30 %
Iron: 113 ug/dL (ref 50–170)
Unbound Iron-Bind.Cap.: 260 ug/dL

## 2011-07-29 LAB — CBC WITH DIFFERENTIAL/PLATELET
Basophil #: 0.1 10*3/uL (ref 0.0–0.1)
Basophil %: 0.9 %
Eosinophil #: 0.7 10*3/uL (ref 0.0–0.7)
Eosinophil %: 6.6 %
HCT: 44.3 % (ref 35.0–47.0)
HGB: 14.4 g/dL (ref 12.0–16.0)
Lymphocyte #: 3.3 10*3/uL (ref 1.0–3.6)
Lymphocyte %: 31.6 %
MCH: 27.9 pg (ref 26.0–34.0)
MCHC: 32.4 g/dL (ref 32.0–36.0)
MCV: 86 fL (ref 80–100)
Monocyte #: 0.9 x10 3/mm (ref 0.2–0.9)
Monocyte %: 8.4 %
Neutrophil #: 5.4 10*3/uL (ref 1.4–6.5)
Neutrophil %: 52.5 %
Platelet: 315 10*3/uL (ref 150–440)
RBC: 5.14 10*6/uL (ref 3.80–5.20)
RDW: 14.4 % (ref 11.5–14.5)
WBC: 10.4 10*3/uL (ref 3.6–11.0)

## 2011-07-29 LAB — TSH: Thyroid Stimulating Horm: 0.871 u[IU]/mL

## 2011-07-29 LAB — COMPREHENSIVE METABOLIC PANEL
Albumin: 3.5 g/dL (ref 3.4–5.0)
Alkaline Phosphatase: 53 U/L (ref 50–136)
Anion Gap: 8 (ref 7–16)
BUN: 15 mg/dL (ref 7–18)
Bilirubin,Total: 0.3 mg/dL (ref 0.2–1.0)
Calcium, Total: 9 mg/dL (ref 8.5–10.1)
Chloride: 99 mmol/L (ref 98–107)
Co2: 29 mmol/L (ref 21–32)
Creatinine: 1.06 mg/dL (ref 0.60–1.30)
EGFR (African American): >60
EGFR (Non-African Amer.): 60 — ABNORMAL LOW
Glucose: 97 mg/dL (ref 65–99)
Osmolality: 273 (ref 275–301)
Potassium: 3.9 mmol/L (ref 3.5–5.1)
SGOT(AST): 27 U/L (ref 15–37)
SGPT (ALT): 25 U/L
Sodium: 136 mmol/L (ref 136–145)
Total Protein: 8.9 g/dL — ABNORMAL HIGH (ref 6.4–8.2)

## 2011-07-29 LAB — HEMOGLOBIN A1C: Hemoglobin A1C: 6.3 % (ref 4.2–6.3)

## 2011-07-29 LAB — MAGNESIUM: Magnesium: 1.6 mg/dL — ABNORMAL LOW

## 2011-07-29 LAB — PROTIME-INR
INR: 0.8
Prothrombin Time: 11.9 secs (ref 11.5–14.7)

## 2011-07-29 LAB — AMYLASE: Amylase: 62 U/L (ref 25–115)

## 2011-07-29 LAB — FOLATE: Folic Acid: 18.3 ng/mL (ref 3.1–100.0)

## 2011-07-29 LAB — APTT: Activated PTT: 29.2 secs (ref 23.6–35.9)

## 2011-07-29 LAB — FERRITIN: Ferritin (ARMC): 133 ng/mL (ref 8–388)

## 2011-07-29 LAB — LIPASE, BLOOD: Lipase: 127 U/L (ref 73–393)

## 2011-07-29 LAB — PHOSPHORUS: Phosphorus: 3.5 mg/dL (ref 2.5–4.9)

## 2011-07-30 LAB — BILIRUBIN, DIRECT: Bilirubin, Direct: <0.1 mg/dL (ref 0.00–0.20)

## 2011-08-08 ENCOUNTER — Ambulatory Visit: Payer: Self-pay | Admitting: Bariatrics

## 2011-08-11 ENCOUNTER — Ambulatory Visit: Payer: Self-pay | Admitting: Bariatrics

## 2011-09-07 ENCOUNTER — Ambulatory Visit: Payer: Self-pay | Admitting: Bariatrics

## 2011-11-03 ENCOUNTER — Encounter (HOSPITAL_COMMUNITY): Payer: Self-pay | Admitting: Emergency Medicine

## 2011-11-03 ENCOUNTER — Emergency Department (HOSPITAL_COMMUNITY)
Admission: EM | Admit: 2011-11-03 | Discharge: 2011-11-03 | Disposition: A | Discharge status: Home / Self Care | Payer: PRIVATE HEALTH INSURANCE | Attending: Emergency Medicine | Admitting: Emergency Medicine

## 2011-11-03 DIAGNOSIS — K219 Gastro-esophageal reflux disease without esophagitis: Secondary | ICD-10-CM | POA: Insufficient documentation

## 2011-11-03 DIAGNOSIS — M79606 Pain in leg, unspecified: Secondary | ICD-10-CM

## 2011-11-03 DIAGNOSIS — M79609 Pain in unspecified limb: Secondary | ICD-10-CM | POA: Insufficient documentation

## 2011-11-03 DIAGNOSIS — I10 Essential (primary) hypertension: Secondary | ICD-10-CM | POA: Insufficient documentation

## 2011-11-03 MED ORDER — IBUPROFEN 600 MG PO TABS: 600.0000 mg | ORAL_TABLET | Freq: Three times a day (TID) | ORAL | Status: AC

## 2011-11-03 MED ORDER — DIAZEPAM 5 MG PO TABS: 5.0000 mg | ORAL_TABLET | Freq: Two times a day (BID) | ORAL | Status: AC

## 2011-11-03 NOTE — ED Notes (Signed)
Prescription x2 given with discharge instructions.  

## 2011-11-03 NOTE — ED Notes (Signed)
Pt c/o right leg pain x 1 month behind knee and into calf; pt sts increased pain x 2 days with swelling

## 2011-11-03 NOTE — Progress Notes (Signed)
VASCULAR LAB PRELIMINARY  PRELIMINARY  PRELIMINARY  PRELIMINARY  Right lower extremity venous duplex completed.    Preliminary report:  Right:  No evidence of DVT, superficial thrombosis, or Baker's cyst.  Yolanda Brown, RVS 11/03/2011, 6:43 PM

## 2011-11-03 NOTE — ED Provider Notes (Signed)
History   This chart was scribed for Gerhard Munch, MD by Melba Coon. The patient was seen in room TR02C/TR02C and the patient's care was started at 7:15PM.    CSN: [id number]  Arrival date & time 11/03/11  1553   First MD Initiated Contact with Patient 11/03/11 1904      Chief Complaint  Patient presents with  . Leg Pain    (Consider location/radiation/quality/duration/timing/severity/associated sxs/prior treatment) The history is provided by the patient. No language interpreter was used.   Yolanda Brown is a 51 y.o. female who presents to the Emergency Department complaining of constant, moderate to severe, burning, posterior right knee pain that radiates into her calf with an onset 2 days ago. Pt has had the pain for a month but has gotten progressively worse around onset. Pt states that it feels like "a constant Charlie horse." Nothing has alleviated the pain the last 2 days. Breathing normal compared to baseline. Pt also c/o right hip pain. No fever, neck pain, sore throat, rash, back pain, CP, SOB, abd pain, n/v/d, dysuria, or extremity edema, weakness, numbness, or tingling. No previous Hx of surgeries. No other pertinent medical symptoms.  Past Medical History  Diagnosis Date  . Hypertension   . GERD (gastroesophageal reflux disease)     History reviewed. No pertinent past surgical history.  History reviewed. No pertinent family history.  History  Substance Use Topics  . Smoking status: Never Smoker   . Smokeless tobacco: Not on file  . Alcohol Use: Yes    OB History    Grav Para Term Preterm Abortions TAB SAB Ect Mult Living                  Review of Systems 10 Systems reviewed and all are negative for acute change except as noted in the HPI.   Allergies  Aspirin  Home Medications   Current Outpatient Rx  Name Route Sig Dispense Refill  . AMLODIPINE BESYLATE 5 MG PO TABS Oral Take 5 mg by mouth daily.    . BUPROPION HCL ER (XL) 300 MG PO TB24  Oral Take 300 mg by mouth daily.    . DEXLANSOPRAZOLE 30 MG PO CPDR Oral Take 30 mg by mouth daily.    . ADULT MULTIVITAMIN W/MINERALS CH Oral Take 1 tablet by mouth daily.    Marland Kitchen PROBIOTIC PO CAPS Oral Take 1 capsule by mouth daily.    . TRIAMTERENE-HCTZ 37.5-25 MG PO TABS Oral Take 1 tablet by mouth daily.    Marland Kitchen VITAMIN B-12 1000 MCG PO TABS Oral Take 1,000 mcg by mouth daily.      BP 142/87  Pulse 104  Temp 98.3 F (36.8 C) (Oral)  Resp 20  SpO2 94%  Physical Exam  Nursing note and vitals reviewed. Constitutional: She is oriented to person, place, and time. She appears well-developed and well-nourished.  HENT:  Head: Normocephalic and atraumatic.  Eyes: EOM are normal.  Neck: Normal range of motion. Neck supple.  Cardiovascular: Normal rate, normal heart sounds and intact distal pulses.   Pulmonary/Chest: Effort normal and breath sounds normal.  Abdominal: Bowel sounds are normal. She exhibits no distension. There is no tenderness.  Musculoskeletal: Normal range of motion. She exhibits no edema and no tenderness.       No gross asymmetry or deformity in either lower extremity  Neurological: She is alert and oriented to person, place, and time. She has normal strength. No cranial nerve deficit or sensory deficit.  Skin:  Skin is warm and dry. No rash noted.  Psychiatric: She has a normal mood and affect.    ED Course  Procedures (including critical care time)    COORDINATION OF CARE:  7:19PM - pt advised to f/u with orthopedist. Pt will be Rx valium and ibuprofen. Pt advised to apply ice at home. Pt ready for d/c.   Labs Reviewed - No data to display No results found.   No diagnosis found.    MDM  I personally performed the services described in this documentation, which was scribed in my presence. The recorded information has been reviewed and considered.  This pleasant female presents with concerns over right leg pain.  The patient's description of pain in the  right popliteal and calf region is concerning for DVT.  The patient has no notable risk factors, and her pain may also be secondary to radiculopathy or muscle strain.  The patient's ultrasound was unremarkable, and absent distress or other notable complaints, the patient is appropriate for discharge after a discussion about following up with orthopedics.     Gerhard Munch, MD 11/03/11 1925

## 2012-01-23 IMAGING — RF DG BARIUM SWALLOW
1 series · 14 of 14 positions shown · non-contrast
Comparison: none

REASON FOR EXAM: Dysphagia
COMMENTS:

[Series 1: run · 14 of 14 slices shown]
[im 1/14]
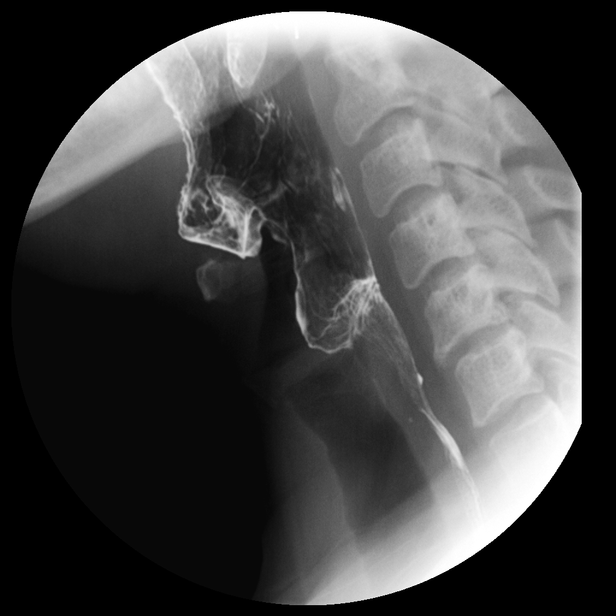
[im 2/14]
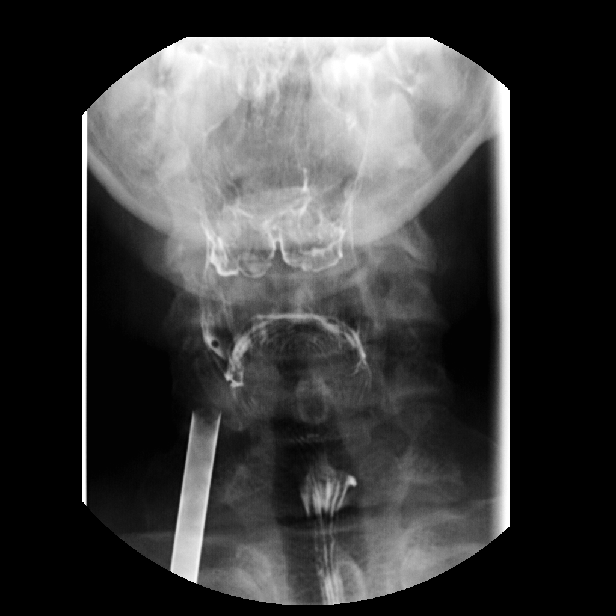
[im 3/14]
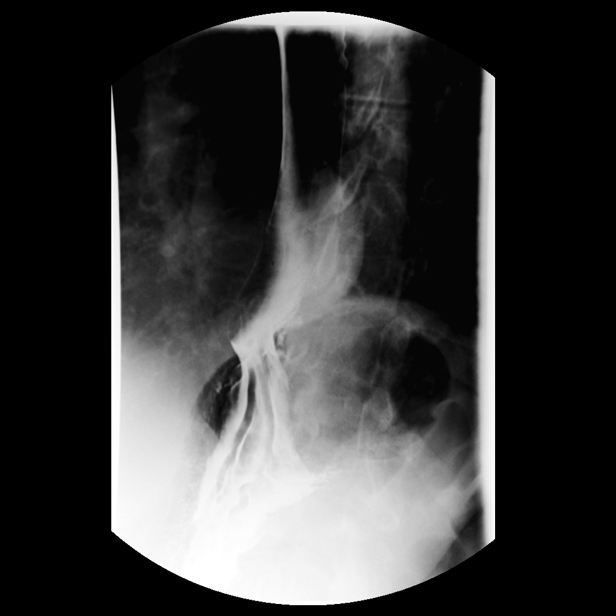
[im 4/14]
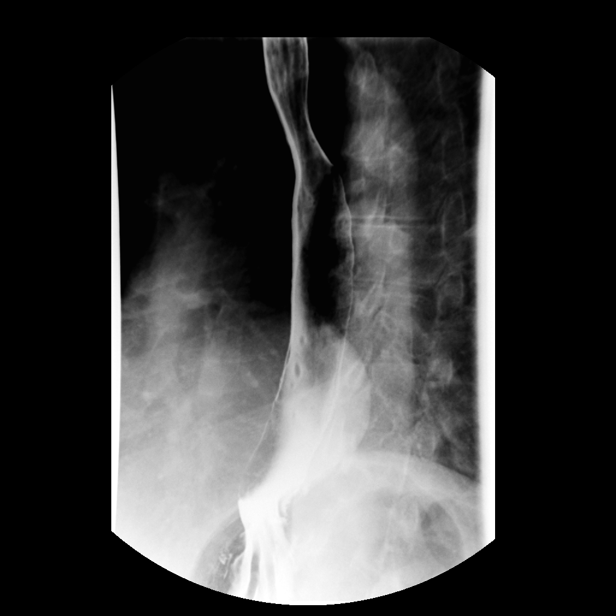
[im 5/14]
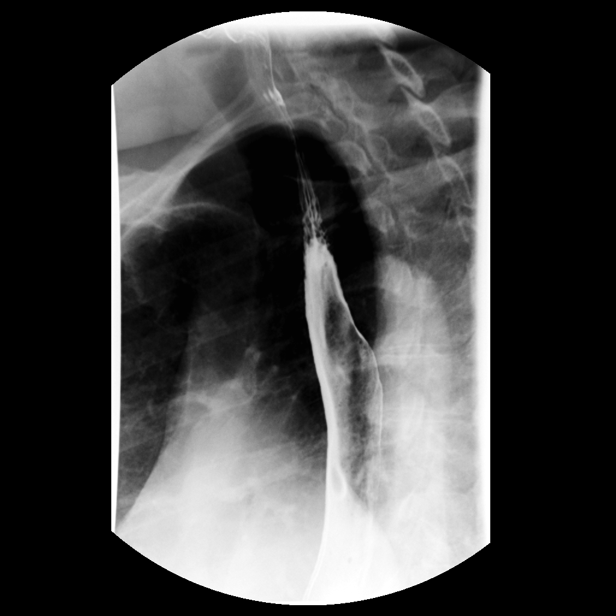
[im 6/14]
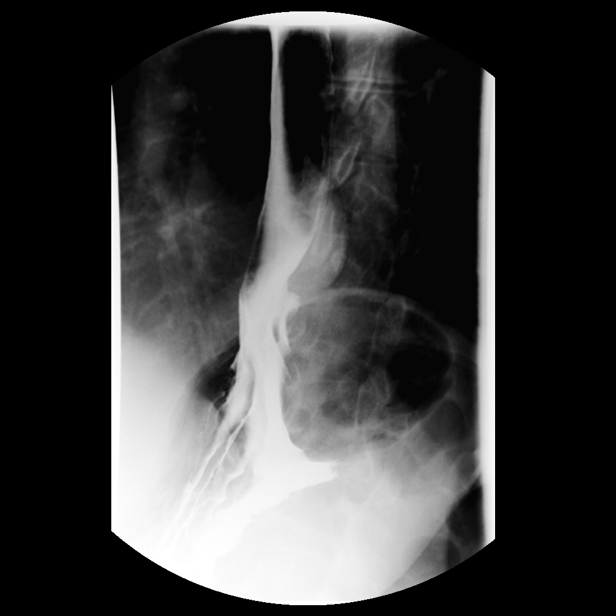
[im 7/14]
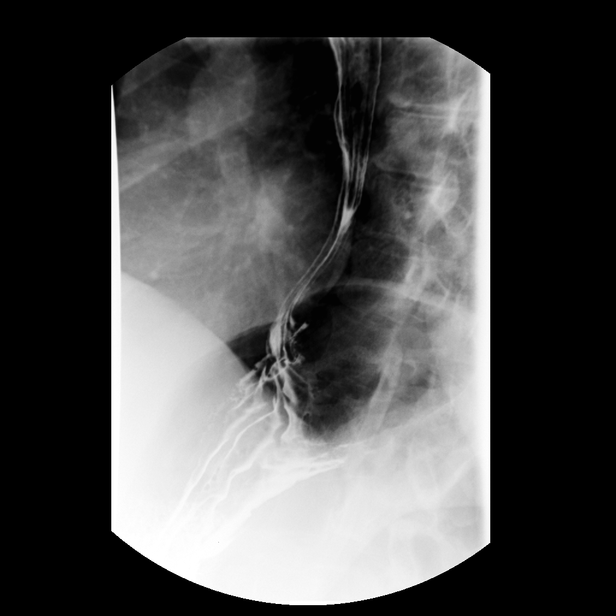
[im 8/14]
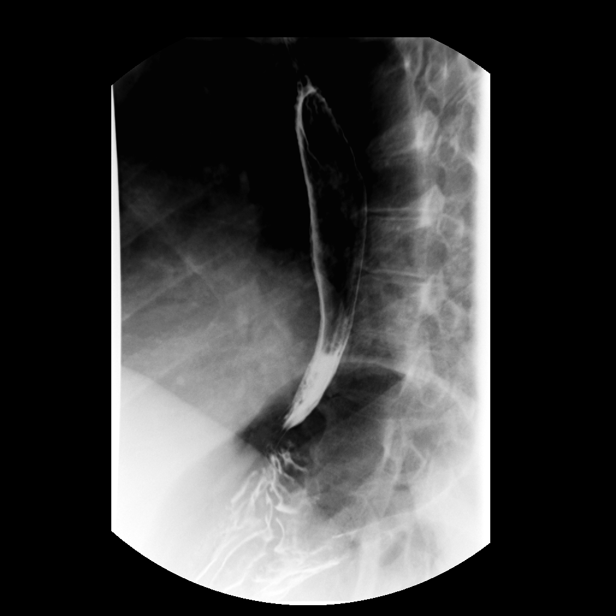
[im 9/14]
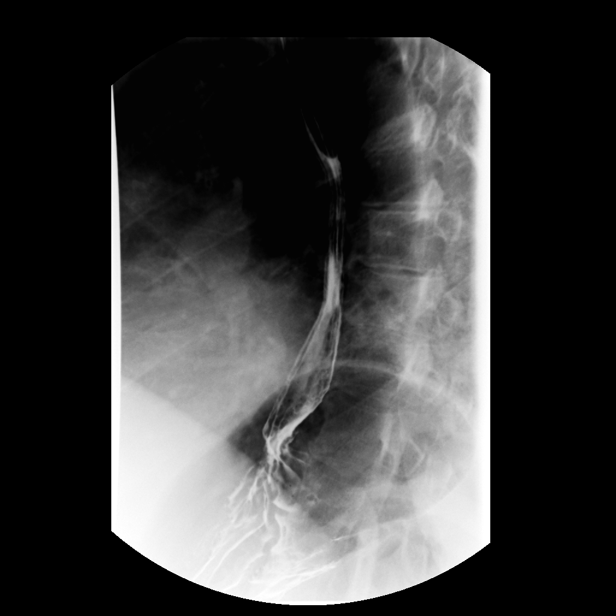
[im 10/14]
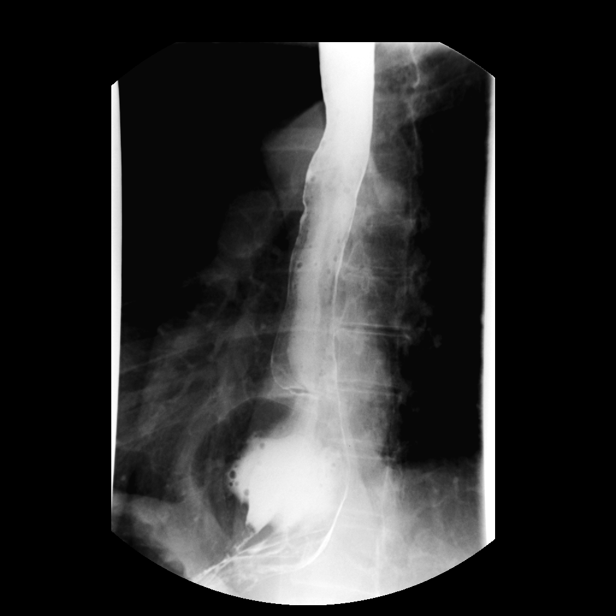
[im 11/14]
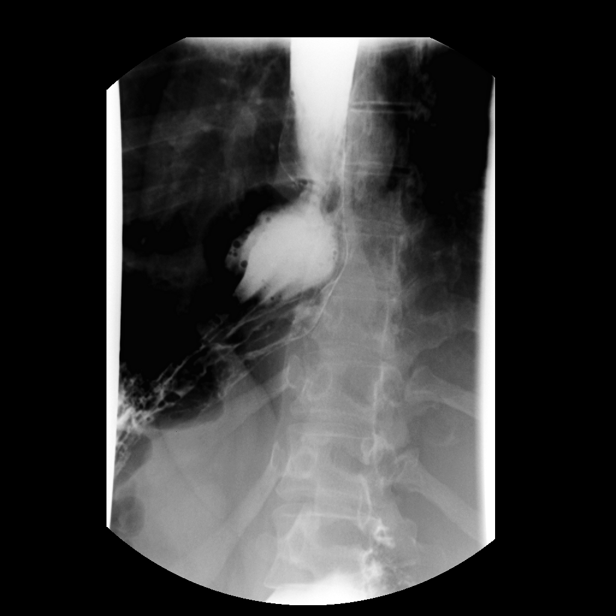
[im 12/14]
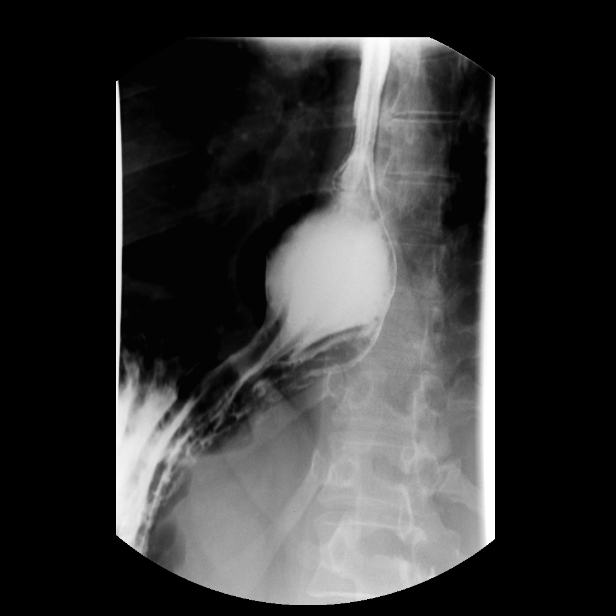
[im 13/14]
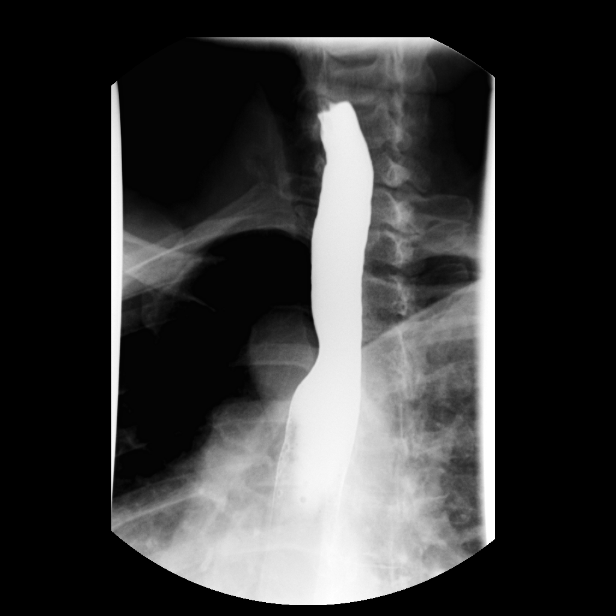
[im 14/14]
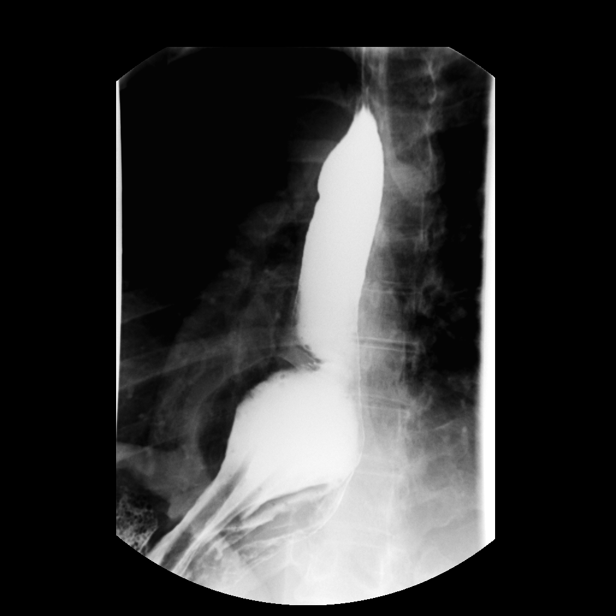

[14 of 14 positions shown; findings below may reference images not displayed]

PROCEDURE:     FL  - FL BARIUM SWALLOW  - December 26, 2010  [DATE]

RESULT:     The anticipated procedure was discussed with Ms. Bianca. She
voiced her willingness to proceed. She ingested barium without difficulty.
The cervical esophagus distended well. There was no laryngeal penetration of
the barium. The thoracic esophagus distended well. The mucosal pattern was
normal. There is a moderate-sized reducible hiatal hernia with spontaneous
gastroesophageal reflux. There is no evidence of a stricture. A 12 mm barium
pill passed without difficulty.
IMPRESSION: The cervical and midthoracic esophagus are normal in
appearance. The distal esophagus demonstrates a moderate sized reducible
hiatal hernia with a moderate amount of spontaneous gastroesophageal reflux.
There is no evidence of a stricture.

## 2012-03-18 ENCOUNTER — Encounter (HOSPITAL_COMMUNITY): Payer: Self-pay

## 2012-03-18 ENCOUNTER — Emergency Department (HOSPITAL_COMMUNITY)
Admission: EM | Admit: 2012-03-18 | Discharge: 2012-03-18 | Disposition: A | Discharge status: Home / Self Care | Payer: No Typology Code available for payment source | Attending: Emergency Medicine | Admitting: Emergency Medicine

## 2012-03-18 ENCOUNTER — Emergency Department (HOSPITAL_COMMUNITY): Payer: No Typology Code available for payment source

## 2012-03-18 DIAGNOSIS — I10 Essential (primary) hypertension: Secondary | ICD-10-CM | POA: Insufficient documentation

## 2012-03-18 DIAGNOSIS — Z8719 Personal history of other diseases of the digestive system: Secondary | ICD-10-CM | POA: Insufficient documentation

## 2012-03-18 DIAGNOSIS — M545 Low back pain, unspecified: Secondary | ICD-10-CM | POA: Insufficient documentation

## 2012-03-18 DIAGNOSIS — M549 Dorsalgia, unspecified: Secondary | ICD-10-CM | POA: Insufficient documentation

## 2012-03-18 DIAGNOSIS — Y9389 Activity, other specified: Secondary | ICD-10-CM | POA: Insufficient documentation

## 2012-03-18 DIAGNOSIS — Y92410 Unspecified street and highway as the place of occurrence of the external cause: Secondary | ICD-10-CM | POA: Insufficient documentation

## 2012-03-18 DIAGNOSIS — M542 Cervicalgia: Secondary | ICD-10-CM

## 2012-03-18 DIAGNOSIS — S0993XA Unspecified injury of face, initial encounter: Secondary | ICD-10-CM | POA: Insufficient documentation

## 2012-03-18 DIAGNOSIS — Z79899 Other long term (current) drug therapy: Secondary | ICD-10-CM | POA: Insufficient documentation

## 2012-03-18 DIAGNOSIS — S199XXA Unspecified injury of neck, initial encounter: Secondary | ICD-10-CM | POA: Insufficient documentation

## 2012-03-18 MED ORDER — OXYCODONE-ACETAMINOPHEN 5-325 MG PO TABS: ORAL_TABLET | ORAL | Status: DC
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
## 2012-03-18 NOTE — ED Provider Notes (Signed)
Medical screening examination/treatment/procedure(s) were performed by non-physician practitioner and as supervising physician I was immediately available for consultation/collaboration.   Dione Booze, MD 03/18/12 1655

## 2012-03-18 NOTE — ED Notes (Signed)
Patient transported to X-ray 

## 2012-04-30 ENCOUNTER — Emergency Department (HOSPITAL_COMMUNITY)
Admission: EM | Admit: 2012-04-30 | Discharge: 2012-05-01 | Disposition: A | Discharge status: Home / Self Care | Payer: BC Managed Care – PPO | Attending: Emergency Medicine | Admitting: Emergency Medicine

## 2012-04-30 ENCOUNTER — Encounter (HOSPITAL_COMMUNITY): Payer: Self-pay | Admitting: *Deleted

## 2012-04-30 DIAGNOSIS — K219 Gastro-esophageal reflux disease without esophagitis: Secondary | ICD-10-CM | POA: Insufficient documentation

## 2012-04-30 DIAGNOSIS — A499 Bacterial infection, unspecified: Secondary | ICD-10-CM | POA: Insufficient documentation

## 2012-04-30 DIAGNOSIS — Z8719 Personal history of other diseases of the digestive system: Secondary | ICD-10-CM | POA: Insufficient documentation

## 2012-04-30 DIAGNOSIS — K5792 Diverticulitis of intestine, part unspecified, without perforation or abscess without bleeding: Secondary | ICD-10-CM

## 2012-04-30 DIAGNOSIS — Z79899 Other long term (current) drug therapy: Secondary | ICD-10-CM | POA: Insufficient documentation

## 2012-04-30 DIAGNOSIS — I10 Essential (primary) hypertension: Secondary | ICD-10-CM | POA: Insufficient documentation

## 2012-04-30 DIAGNOSIS — Z90710 Acquired absence of both cervix and uterus: Secondary | ICD-10-CM | POA: Insufficient documentation

## 2012-04-30 DIAGNOSIS — K5732 Diverticulitis of large intestine without perforation or abscess without bleeding: Secondary | ICD-10-CM | POA: Insufficient documentation

## 2012-04-30 DIAGNOSIS — Z9089 Acquired absence of other organs: Secondary | ICD-10-CM | POA: Insufficient documentation

## 2012-04-30 DIAGNOSIS — N898 Other specified noninflammatory disorders of vagina: Secondary | ICD-10-CM | POA: Insufficient documentation

## 2012-04-30 HISTORY — DX: Diverticulitis of intestine, part unspecified, without perforation or abscess without bleeding: K57.92

## 2012-04-30 NOTE — ED Notes (Signed)
Pt states she has history of diverticulitis,  Cramping and nausea since yesterday,  Little relief after bm or flatus

## 2012-05-01 ENCOUNTER — Emergency Department (HOSPITAL_COMMUNITY): Payer: BC Managed Care – PPO

## 2012-05-01 LAB — CBC
HCT: 40.6 % (ref 36.0–46.0)
Hemoglobin: 13.8 g/dL (ref 12.0–15.0)
MCH: 28.6 pg (ref 26.0–34.0)
MCHC: 34 g/dL (ref 30.0–36.0)
MCV: 84.2 fL (ref 78.0–100.0)
Platelets: 309 10*3/uL (ref 150–400)
RBC: 4.82 MIL/uL (ref 3.87–5.11)
RDW: 14.8 % (ref 11.5–15.5)
WBC: 13.5 10*3/uL — ABNORMAL HIGH (ref 4.0–10.5)

## 2012-05-01 LAB — COMPREHENSIVE METABOLIC PANEL
ALT: 16 U/L (ref 0–35)
AST: 21 U/L (ref 0–37)
Albumin: 3.5 g/dL (ref 3.5–5.2)
Alkaline Phosphatase: 71 U/L (ref 39–117)
BUN: 17 mg/dL (ref 6–23)
CO2: 24 mEq/L (ref 19–32)
Calcium: 9.3 mg/dL (ref 8.4–10.5)
Chloride: 102 mEq/L (ref 96–112)
Creatinine, Ser: 1.29 mg/dL — ABNORMAL HIGH (ref 0.50–1.10)
GFR calc Af Amer: 53 mL/min — ABNORMAL LOW (ref >90)
GFR calc non Af Amer: 46 mL/min — ABNORMAL LOW (ref >90)
Glucose, Bld: 137 mg/dL — ABNORMAL HIGH (ref 70–99)
Potassium: 3.8 mEq/L (ref 3.5–5.1)
Sodium: 137 mEq/L (ref 135–145)
Total Bilirubin: 0.2 mg/dL — ABNORMAL LOW (ref 0.3–1.2)
Total Protein: 8.4 g/dL — ABNORMAL HIGH (ref 6.0–8.3)

## 2012-05-01 LAB — POCT I-STAT, CHEM 8
BUN: 18 mg/dL (ref 6–23)
Calcium, Ion: 1.26 mmol/L — ABNORMAL HIGH (ref 1.12–1.23)
Chloride: 108 mEq/L (ref 96–112)
Creatinine, Ser: 1.4 mg/dL — ABNORMAL HIGH (ref 0.50–1.10)
Glucose, Bld: 128 mg/dL — ABNORMAL HIGH (ref 70–99)
HCT: 44 % (ref 36.0–46.0)
Hemoglobin: 15 g/dL (ref 12.0–15.0)
Potassium: 3.5 mEq/L (ref 3.5–5.1)
Sodium: 143 mEq/L (ref 135–145)
TCO2: 25 mmol/L (ref 0–100)

## 2012-05-01 LAB — LIPASE, BLOOD: Lipase: 27 U/L (ref 11–59)

## 2012-05-01 MED ORDER — METRONIDAZOLE 500 MG PO TABS: 500.0000 mg | ORAL_TABLET | Freq: Two times a day (BID) | ORAL | Status: DC

## 2012-05-01 MED ORDER — METRONIDAZOLE 500 MG PO TABS
500.0000 mg | ORAL_TABLET | Freq: Once | ORAL | Status: AC
  Administered 2012-05-01: 500 mg via ORAL
  Filled 2012-05-01: qty 1

## 2012-05-01 MED ORDER — SODIUM CHLORIDE 0.9 % IV SOLN
INTRAVENOUS | Status: DC
  Administered 2012-05-01: 125 mL/h via INTRAVENOUS

## 2012-05-01 MED ORDER — IOHEXOL 300 MG/ML  SOLN
50.0000 mL | Freq: Once | INTRAMUSCULAR | Status: AC | PRN
  Administered 2012-05-01: 50 mL via ORAL

## 2012-05-01 MED ORDER — HYDROMORPHONE HCL PF 1 MG/ML IJ SOLN
1.0000 mg | Freq: Once | INTRAMUSCULAR | Status: AC
  Administered 2012-05-01: 1 mg via INTRAVENOUS
  Filled 2012-05-01: qty 1

## 2012-05-01 MED ORDER — CIPROFLOXACIN HCL 500 MG PO TABS
500.0000 mg | ORAL_TABLET | Freq: Once | ORAL | Status: AC
  Administered 2012-05-01: 500 mg via ORAL
  Filled 2012-05-01: qty 1

## 2012-05-01 MED ORDER — SODIUM CHLORIDE 0.9 % IV BOLUS (SEPSIS)
1000.0000 mL | Freq: Once | INTRAVENOUS | Status: AC
  Administered 2012-05-01: 1000 mL via INTRAVENOUS

## 2012-05-01 MED ORDER — ONDANSETRON HCL 4 MG/2ML IJ SOLN
4.0000 mg | Freq: Once | INTRAMUSCULAR | Status: AC
  Administered 2012-05-01: 4 mg via INTRAVENOUS
  Filled 2012-05-01: qty 2

## 2012-05-01 MED ORDER — PROMETHAZINE HCL 25 MG PO TABS: 25.0000 mg | ORAL_TABLET | Freq: Four times a day (QID) | ORAL | Status: DC | PRN

## 2012-05-01 MED ORDER — CIPROFLOXACIN HCL 500 MG PO TABS: 500.0000 mg | ORAL_TABLET | Freq: Two times a day (BID) | ORAL | Status: DC

## 2012-05-01 MED ORDER — OXYCODONE-ACETAMINOPHEN 5-325 MG PO TABS: 2.0000 | ORAL_TABLET | ORAL | Status: DC | PRN

## 2012-05-01 MED ORDER — IOHEXOL 300 MG/ML  SOLN
100.0000 mL | Freq: Once | INTRAMUSCULAR | Status: AC | PRN
  Administered 2012-05-01: 100 mL via INTRAVENOUS

## 2012-05-01 NOTE — ED Notes (Signed)
MD at bedside. 

## 2012-05-01 NOTE — ED Provider Notes (Signed)
History     CSN: [id number]  Arrival date & time 04/30/12  2342   First MD Initiated Contact with Patient 05/01/12 0005      Chief Complaint  Patient presents with  . Abdominal Pain    (Consider location/radiation/quality/duration/timing/severity/associated sxs/prior treatment) HPI Hx per PT. LLQ lower ABD pain.  Feels like prior diverticulitis, nausea no vomiting, severe sharp pain not radiating, no alleviating factors, last BM today no blood. Pain constant  Past Medical History  Diagnosis Date  . Hypertension   . GERD (gastroesophageal reflux disease)   . Diverticulitis     Past Surgical History  Procedure Laterality Date  . Cholecystectomy    . Abdominal hysterectomy      No family history on file.  History  Substance Use Topics  . Smoking status: Never Smoker   . Smokeless tobacco: Not on file  . Alcohol Use: Yes    OB History   Grav Para Term Preterm Abortions TAB SAB Ect Mult Living                  Review of Systems  Constitutional: Negative for fever and chills.  HENT: Negative for neck pain and neck stiffness.   Eyes: Negative for pain.  Respiratory: Negative for shortness of breath.   Cardiovascular: Negative for chest pain.  Gastrointestinal: Positive for abdominal pain. Negative for vomiting.  Genitourinary: Negative for dysuria.  Musculoskeletal: Negative for back pain.  Skin: Negative for rash.  Neurological: Negative for headaches.  All other systems reviewed and are negative.    Allergies  Aspirin  Home Medications   Current Outpatient Rx  Name  Route  Sig  Dispense  Refill  . ALPRAZolam (XANAX) 0.5 MG tablet   Oral   Take 0.5 mg by mouth daily as needed. For anxiety         . amLODipine (NORVASC) 5 MG tablet   Oral   Take 5 mg by mouth daily.         . Ascorbic Acid (VITAMIN C PO)   Oral   Take 1 tablet by mouth daily.         Marland Kitchen buPROPion (WELLBUTRIN XL) 300 MG 24 hr tablet   Oral   Take 300 mg by mouth daily.          . Cyanocobalamin (VITAMIN B-12 IJ)   Injection   Inject 1 each as directed once a week.         Marland Kitchen dexlansoprazole (DEXILANT) 60 MG capsule   Oral   Take 60 mg by mouth daily.         . Multiple Vitamin (MULITIVITAMIN WITH MINERALS) TABS   Oral   Take 1 tablet by mouth daily.         Marland Kitchen oxyCODONE-acetaminophen (PERCOCET/ROXICET) 5-325 MG per tablet      1 to 2 tabs PO q6hrs  PRN for pain   15 tablet   0   . PROBIOTIC CAPS   Oral   Take 1 capsule by mouth daily.         Marland Kitchen triamterene-hydrochlorothiazide (MAXZIDE-25) 37.5-25 MG per tablet   Oral   Take 1 tablet by mouth daily.         . vitamin E 1000 UNIT capsule   Oral   Take 1,000 Units by mouth daily.           BP 114/86  Pulse 115  Temp(Src) 98.3 F (36.8 C) (Oral)  Resp 20  SpO2 96%  Physical Exam  Constitutional: She is oriented to person, place, and time. She appears well-developed and well-nourished.  HENT:  Head: Normocephalic and atraumatic.  Eyes: EOM are normal. Pupils are equal, round, and reactive to light.  Neck: Neck supple.  Cardiovascular: Normal rate, regular rhythm and intact distal pulses.   Pulmonary/Chest: Effort normal and breath sounds normal. No respiratory distress.  Abdominal: Soft. Bowel sounds are normal. She exhibits no distension. There is no rebound and no guarding.  TTP LLQ  Musculoskeletal: Normal range of motion. She exhibits no edema.  Neurological: She is alert and oriented to person, place, and time.  Skin: Skin is warm and dry.    ED Course  Procedures (including critical care time)  Results for orders placed during the hospital encounter of 04/30/12  CBC      Result Value Range   WBC 13.5 (*) 4.0 - 10.5 K/uL   RBC 4.82  3.87 - 5.11 MIL/uL   Hemoglobin 13.8  12.0 - 15.0 g/dL   HCT 16.1  09.6 - 04.5 %   MCV 84.2  78.0 - 100.0 fL   MCH 28.6  26.0 - 34.0 pg   MCHC 34.0  30.0 - 36.0 g/dL   RDW 40.9  81.1 - 91.4 %   Platelets 309  150 - 400 K/uL   COMPREHENSIVE METABOLIC PANEL      Result Value Range   Sodium 137  135 - 145 mEq/L   Potassium 3.8  3.5 - 5.1 mEq/L   Chloride 102  96 - 112 mEq/L   CO2 24  19 - 32 mEq/L   Glucose, Bld 137 (*) 70 - 99 mg/dL   BUN 17  6 - 23 mg/dL   Creatinine, Ser 7.82 (*) 0.50 - 1.10 mg/dL   Calcium 9.3  8.4 - 95.6 mg/dL   Total Protein 8.4 (*) 6.0 - 8.3 g/dL   Albumin 3.5  3.5 - 5.2 g/dL   AST 21  0 - 37 U/L   ALT 16  0 - 35 U/L   Alkaline Phosphatase 71  39 - 117 U/L   Total Bilirubin 0.2 (*) 0.3 - 1.2 mg/dL   GFR calc non Af Amer 46 (*) >90 mL/min   GFR calc Af Amer 53 (*) >90 mL/min  LIPASE, BLOOD      Result Value Range   Lipase 27  11 - 59 U/L  POCT I-STAT, CHEM 8      Result Value Range   Sodium 143  135 - 145 mEq/L   Potassium 3.5  3.5 - 5.1 mEq/L   Chloride 108  96 - 112 mEq/L   BUN 18  6 - 23 mg/dL   Creatinine, Ser 2.13 (*) 0.50 - 1.10 mg/dL   Glucose, Bld 086 (*) 70 - 99 mg/dL   Calcium, Ion 5.78 (*) 1.12 - 1.23 mmol/L   TCO2 25  0 - 100 mmol/L   Hemoglobin 15.0  12.0 - 15.0 g/dL   HCT 46.9  62.9 - 52.8 %   Ct Abdomen Pelvis W Contrast  05/01/2012  *RADIOLOGY REPORT*  Clinical Data: Cramping and nausea for 1 day.  Left lower quadrant pain.  CT ABDOMEN AND PELVIS WITH CONTRAST  Technique:  Multidetector CT imaging of the abdomen and pelvis was performed following the standard protocol during bolus administration of intravenous contrast.  Contrast: 50mL OMNIPAQUE IOHEXOL 300 MG/ML  SOLN, OMNIPAQUE IOHEXOL 300 MG/ML  SOLN  Comparison: 02/26/2007  Findings: Dependent atelectasis in the lung bases.  Moderate sized esophageal hiatal hernia.  Surgical absence of the gallbladder.  Mild physiologic dilatation of extrahepatic bile ducts.  The liver, spleen, pancreas, adrenal glands, kidneys, abdominal aorta, and retroperitoneal lymph nodes are unremarkable.  The stomach, small bowel, and colon are not abnormally distended.  Stool filled colon.  No free air or free fluid in the abdomen.   Abdominal wall musculature appears intact.  Pelvis:  Diverticulosis of the sigmoid colon.  There is infiltration in the pericolonic fat around the distal sigmoid colon consistent with diverticulitis.  No diverticular abscess is identified.  No free air.  The bladder wall is not thickened.  The uterus appears to be surgically absent.  Cystic changes in the ovaries without significant enlargement.  No loculated pelvic fluid collections.  The appendix is normal.  No significant pelvic lymphadenopathy.  Normal alignment of the lumbar vertebrae with mild degenerative change.  IMPRESSION: Diverticulosis with inflammatory changes in the lower sigmoid colon consistent with mild diverticulitis.  No abscess.  Moderate sized esophageal hiatal hernia.   Original Report Authenticated By: Burman Nieves, M.D.    IV fluids. IV Dilaudid. By mouth Flagyl and Cipro provided  3:22 AM pain improving. No emesis or fevers. Plan discharge home with the medications and antibiotics and) care followup. Reliable historian and agrees to strict return precautions  MDM  Abdominal pain and clinical diverticulitis confirmed with CT scan reviewed by myself and with patient as above  Evaluated with CT scan, labs - leukocytosis present  Improved with IV fluids antibiotics and IV narcotics  Vital signs and nursing notes reviewed and considered - tachycardia resolved        Sunnie Nielsen, MD 05/01/12 [phone]

## 2012-06-23 ENCOUNTER — Emergency Department (HOSPITAL_COMMUNITY): Payer: Self-pay

## 2012-06-23 ENCOUNTER — Encounter (HOSPITAL_COMMUNITY): Payer: Self-pay | Admitting: Emergency Medicine

## 2012-06-23 ENCOUNTER — Emergency Department (HOSPITAL_COMMUNITY)
Admission: EM | Admit: 2012-06-23 | Discharge: 2012-06-23 | Disposition: A | Discharge status: Home / Self Care | Payer: Self-pay | Attending: Emergency Medicine | Admitting: Emergency Medicine

## 2012-06-23 DIAGNOSIS — R197 Diarrhea, unspecified: Secondary | ICD-10-CM | POA: Insufficient documentation

## 2012-06-23 DIAGNOSIS — K5900 Constipation, unspecified: Secondary | ICD-10-CM | POA: Insufficient documentation

## 2012-06-23 DIAGNOSIS — R112 Nausea with vomiting, unspecified: Secondary | ICD-10-CM | POA: Insufficient documentation

## 2012-06-23 DIAGNOSIS — K219 Gastro-esophageal reflux disease without esophagitis: Secondary | ICD-10-CM | POA: Insufficient documentation

## 2012-06-23 DIAGNOSIS — R109 Unspecified abdominal pain: Secondary | ICD-10-CM | POA: Insufficient documentation

## 2012-06-23 DIAGNOSIS — M4802 Spinal stenosis, cervical region: Secondary | ICD-10-CM | POA: Insufficient documentation

## 2012-06-23 DIAGNOSIS — I10 Essential (primary) hypertension: Secondary | ICD-10-CM | POA: Insufficient documentation

## 2012-06-23 DIAGNOSIS — E669 Obesity, unspecified: Secondary | ICD-10-CM | POA: Insufficient documentation

## 2012-06-23 DIAGNOSIS — Z8719 Personal history of other diseases of the digestive system: Secondary | ICD-10-CM | POA: Insufficient documentation

## 2012-06-23 LAB — URINALYSIS, ROUTINE W REFLEX MICROSCOPIC
Bilirubin Urine: NEGATIVE
Glucose, UA: NEGATIVE mg/dL
Hgb urine dipstick: NEGATIVE
Ketones, ur: NEGATIVE mg/dL
Leukocytes, UA: NEGATIVE
Nitrite: NEGATIVE
Protein, ur: NEGATIVE mg/dL
Specific Gravity, Urine: 1.013 (ref 1.005–1.030)
Urobilinogen, UA: 0.2 mg/dL (ref 0.0–1.0)
pH: 5.5 (ref 5.0–8.0)

## 2012-06-23 LAB — CBC WITH DIFFERENTIAL/PLATELET
Basophils Absolute: 0.1 10*3/uL (ref 0.0–0.1)
Basophils Relative: 1 % (ref 0–1)
Eosinophils Absolute: 0.4 10*3/uL (ref 0.0–0.7)
Eosinophils Relative: 4 % (ref 0–5)
HCT: 43.4 % (ref 36.0–46.0)
Hemoglobin: 14.9 g/dL (ref 12.0–15.0)
Lymphocytes Relative: 46 % (ref 12–46)
Lymphs Abs: 4.4 10*3/uL — ABNORMAL HIGH (ref 0.7–4.0)
MCH: 28.3 pg (ref 26.0–34.0)
MCHC: 34.3 g/dL (ref 30.0–36.0)
MCV: 82.4 fL (ref 78.0–100.0)
Monocytes Absolute: 0.7 10*3/uL (ref 0.1–1.0)
Monocytes Relative: 8 % (ref 3–12)
Neutro Abs: 3.9 10*3/uL (ref 1.7–7.7)
Neutrophils Relative %: 41 % — ABNORMAL LOW (ref 43–77)
Platelets: 304 10*3/uL (ref 150–400)
RBC: 5.27 MIL/uL — ABNORMAL HIGH (ref 3.87–5.11)
RDW: 15.1 % (ref 11.5–15.5)
WBC: 9.4 10*3/uL (ref 4.0–10.5)

## 2012-06-23 LAB — COMPREHENSIVE METABOLIC PANEL
ALT: 24 U/L (ref 0–35)
AST: 25 U/L (ref 0–37)
Albumin: 3.9 g/dL (ref 3.5–5.2)
Alkaline Phosphatase: 52 U/L (ref 39–117)
BUN: 12 mg/dL (ref 6–23)
CO2: 21 mEq/L (ref 19–32)
Calcium: 9.7 mg/dL (ref 8.4–10.5)
Chloride: 100 mEq/L (ref 96–112)
Creatinine, Ser: 1.04 mg/dL (ref 0.50–1.10)
GFR calc Af Amer: 69 mL/min — ABNORMAL LOW (ref >90)
GFR calc non Af Amer: 60 mL/min — ABNORMAL LOW (ref >90)
Glucose, Bld: 101 mg/dL — ABNORMAL HIGH (ref 70–99)
Potassium: 3.3 mEq/L — ABNORMAL LOW (ref 3.5–5.1)
Sodium: 135 mEq/L (ref 135–145)
Total Bilirubin: 0.2 mg/dL — ABNORMAL LOW (ref 0.3–1.2)
Total Protein: 8.4 g/dL — ABNORMAL HIGH (ref 6.0–8.3)

## 2012-06-23 LAB — LIPASE, BLOOD: Lipase: 27 U/L (ref 11–59)

## 2012-06-23 MED ORDER — MORPHINE SULFATE 4 MG/ML IJ SOLN
4.0000 mg | Freq: Once | INTRAMUSCULAR | Status: AC
  Administered 2012-06-23: 4 mg via INTRAVENOUS
  Filled 2012-06-23: qty 1

## 2012-06-23 MED ORDER — ONDANSETRON 8 MG PO TBDP: 8.0000 mg | ORAL_TABLET | Freq: Three times a day (TID) | ORAL | Status: DC | PRN

## 2012-06-23 MED ORDER — OXYCODONE-ACETAMINOPHEN 5-325 MG PO TABS: 1.0000 | ORAL_TABLET | Freq: Four times a day (QID) | ORAL | Status: DC | PRN

## 2012-06-23 MED ORDER — ONDANSETRON HCL 4 MG/2ML IJ SOLN
4.0000 mg | Freq: Once | INTRAMUSCULAR | Status: AC
  Administered 2012-06-23: 4 mg via INTRAVENOUS
  Filled 2012-06-23: qty 2

## 2012-06-23 NOTE — ED Provider Notes (Signed)
History     CSN: [id number]  Arrival date & time 06/23/12  2040   First MD Initiated Contact with Patient 06/23/12 2114      Chief Complaint  Patient presents with  . Abdominal Pain    (Consider location/radiation/quality/duration/timing/severity/associated sxs/prior treatment) Patient is a 52 y.o. female presenting with abdominal pain. The history is provided by the patient.  Abdominal Pain Associated symptoms: constipation, diarrhea, nausea and vomiting   Associated symptoms: no chest pain, no fever and no shortness of breath    patient presents with upper abdominal pain. States it has been getting larger over the last week or 2. She states that she's been told her abdomen is larger than normal. She was treated 2 months ago for diverticulitis. She's had nausea and no vomiting. No diarrhea. She's had decreased appetite. No fevers. She's a previous hiatal hernia.  Past Medical History  Diagnosis Date  . Hypertension   . GERD (gastroesophageal reflux disease)   . Diverticulitis     Past Surgical History  Procedure Laterality Date  . Cholecystectomy    . Abdominal hysterectomy      Family History  Problem Relation Age of Onset  . Hypertension Other   . Diabetes Other   . Cancer Other     History  Substance Use Topics  . Smoking status: Never Smoker   . Smokeless tobacco: Not on file  . Alcohol Use: Yes     Comment: rare    OB History   Grav Para Term Preterm Abortions TAB SAB Ect Mult Living                  Review of Systems  Constitutional: Positive for appetite change. Negative for fever, activity change and unexpected weight change.  HENT: Negative for neck stiffness.   Eyes: Negative for pain.  Respiratory: Negative for chest tightness and shortness of breath.   Cardiovascular: Negative for chest pain and leg swelling.  Gastrointestinal: Positive for nausea, vomiting, abdominal pain, diarrhea and constipation.       Patient's had some constipation that  later became diarrhea.  Genitourinary: Negative for flank pain.  Musculoskeletal: Negative for back pain.  Skin: Negative for rash.  Neurological: Negative for weakness, numbness and headaches.  Psychiatric/Behavioral: Negative for behavioral problems.    Allergies  Aspirin  Home Medications   Current Outpatient Rx  Name  Route  Sig  Dispense  Refill  . acidophilus (RISAQUAD) CAPS   Oral   Take 1 capsule by mouth every morning.         Marland Kitchen ALPRAZolam (XANAX) 0.5 MG tablet   Oral   Take 0.5 mg by mouth daily as needed for anxiety. For anxiety         . amLODipine (NORVASC) 5 MG tablet   Oral   Take 5 mg by mouth every morning.          Marland Kitchen buPROPion (WELLBUTRIN XL) 300 MG 24 hr tablet   Oral   Take 300 mg by mouth every morning.          . Cimetidine (TAGAMET PO)   Oral   Take 1 tablet by mouth once.         Marland Kitchen dexlansoprazole (DEXILANT) 60 MG capsule   Oral   Take 60 mg by mouth every morning.          . Multiple Vitamin (MULITIVITAMIN WITH MINERALS) TABS   Oral   Take 1 tablet by mouth every morning.          Marland Kitchen  triamterene-hydrochlorothiazide (DYAZIDE) 37.5-25 MG per capsule   Oral   Take 1 capsule by mouth every morning.         . ondansetron (ZOFRAN-ODT) 8 MG disintegrating tablet   Oral   Take 1 tablet (8 mg total) by mouth every 8 (eight) hours as needed for nausea.   10 tablet   0   . oxyCODONE-acetaminophen (PERCOCET/ROXICET) 5-325 MG per tablet   Oral   Take 1-2 tablets by mouth every 6 (six) hours as needed for pain.   8 tablet   0     BP 108/77  Pulse 85  Temp(Src) 97.8 F (36.6 C) (Oral)  Resp 16  SpO2 98%  Physical Exam  Nursing note and vitals reviewed. Constitutional: She is oriented to person, place, and time. She appears well-developed and well-nourished.  Patient is obese  HENT:  Head: Normocephalic and atraumatic.  Eyes: EOM are normal. Pupils are equal, round, and reactive to light.  Neck: Normal range of motion.  Neck supple.  Cardiovascular: Normal rate, regular rhythm and normal heart sounds.   No murmur heard. Pulmonary/Chest: Effort normal and breath sounds normal. No respiratory distress. She has no wheezes. She has no rales.  Abdominal: Soft. Bowel sounds are normal. She exhibits no distension. There is tenderness. There is no rebound and no guarding.  Bilateral upper abdominal tenderness without rebound or guarding. No clear masses  Musculoskeletal: Normal range of motion.  Neurological: She is alert and oriented to person, place, and time. No cranial nerve deficit.  Skin: Skin is warm and dry.  Psychiatric: She has a normal mood and affect. Her speech is normal.    ED Course  Procedures (including critical care time)  Labs Reviewed  CBC WITH DIFFERENTIAL - Abnormal; Notable for the following:    RBC 5.27 (*)    Neutrophils Relative 41 (*)    Lymphs Abs 4.4 (*)    All other components within normal limits  COMPREHENSIVE METABOLIC PANEL - Abnormal; Notable for the following:    Potassium 3.3 (*)    Glucose, Bld 101 (*)    Total Protein 8.4 (*)    Total Bilirubin 0.2 (*)    GFR calc non Af Amer 60 (*)    GFR calc Af Amer 69 (*)    All other components within normal limits  URINALYSIS, ROUTINE W REFLEX MICROSCOPIC  LIPASE, BLOOD   Dg Abd Acute W/chest  06/23/2012  *RADIOLOGY REPORT*  Clinical Data: Abdominal pain  ACUTE ABDOMEN SERIES (ABDOMEN 2 VIEW & CHEST 1 VIEW)  Comparison: CT abdomen pelvis of 05/01/2012, chest x-ray of 12/03/2009 and chest with acute abdomen of 06/22/2009  Findings: No active infiltrate or effusion is seen.  Mediastinal contours are stable.  The heart is within normal limits in size. There does appear to be hiatal hernia again noted.  Supine and erect views of the abdomen show no bowel obstruction.  A moderate amount of feces is noted in the colon.  No free air is seen on the erect view.  Surgical clips are present in the right upper quadrant from prior  cholecystectomy.  Calcified phleboliths are noted in the pelvis.  IMPRESSION:  1.  No active lung disease.  Small hiatal hernia. 2.  No bowel obstruction.  No free air.   Original Report Authenticated By: Dwyane Dee, M.D.      1. Abdominal pain       MDM  Patient with abdominal pain with nausea vomiting or diarrhea. X-ray does not show  obstruction and blood work is reassuring. Patient has had recent CT and would rather not have another one. Patient will be discharged home. She's had a previous cholecystectomy        Harrold Donath R. Rubin Payor, MD 06/23/12 [phone]

## 2012-06-23 NOTE — ED Notes (Signed)
Pt states she is having upper abd pain that started a couple of days ago  Pt states her abd is distended and is pushing up making it hard for her to breathe   Pt states her abdomen is so distended even her family has commented about it  Pt states she has not been able to eat due to the pain and distention  Pt states she has a hernia but the pain is above that  Pt also states she has been belching excessively Pt states 2 days prior to this she was constipated and could not have a bowel movement but yesterday and today her stools have been loose

## 2012-08-28 ENCOUNTER — Emergency Department (HOSPITAL_COMMUNITY)
Admission: EM | Admit: 2012-08-28 | Discharge: 2012-08-28 | Disposition: A | Discharge status: Home / Self Care | Payer: BC Managed Care – PPO | Attending: Emergency Medicine | Admitting: Emergency Medicine

## 2012-08-28 ENCOUNTER — Encounter (HOSPITAL_COMMUNITY): Payer: Self-pay | Admitting: Emergency Medicine

## 2012-08-28 DIAGNOSIS — Z8719 Personal history of other diseases of the digestive system: Secondary | ICD-10-CM | POA: Insufficient documentation

## 2012-08-28 DIAGNOSIS — I10 Essential (primary) hypertension: Secondary | ICD-10-CM | POA: Insufficient documentation

## 2012-08-28 DIAGNOSIS — M62838 Other muscle spasm: Secondary | ICD-10-CM

## 2012-08-28 DIAGNOSIS — M549 Dorsalgia, unspecified: Secondary | ICD-10-CM | POA: Insufficient documentation

## 2012-08-28 DIAGNOSIS — M5380 Other specified dorsopathies, site unspecified: Secondary | ICD-10-CM | POA: Insufficient documentation

## 2012-08-28 DIAGNOSIS — Z79899 Other long term (current) drug therapy: Secondary | ICD-10-CM | POA: Insufficient documentation

## 2012-08-28 MED ORDER — METHOCARBAMOL 500 MG PO TABS: 500.0000 mg | ORAL_TABLET | Freq: Two times a day (BID) | ORAL | Status: DC

## 2012-08-28 MED ORDER — KETOROLAC TROMETHAMINE 60 MG/2ML IM SOLN
60.0000 mg | Freq: Once | INTRAMUSCULAR | Status: AC
  Administered 2012-08-28: 60 mg via INTRAMUSCULAR
  Filled 2012-08-28: qty 2

## 2012-08-28 MED ORDER — HYDROCODONE-ACETAMINOPHEN 5-325 MG PO TABS
2.0000 | ORAL_TABLET | Freq: Once | ORAL | Status: AC
  Administered 2012-08-28: 2 via ORAL
  Filled 2012-08-28: qty 2

## 2012-08-28 MED ORDER — DIPHENHYDRAMINE HCL 25 MG PO CAPS
25.0000 mg | ORAL_CAPSULE | Freq: Once | ORAL | Status: AC
  Administered 2012-08-28: 25 mg via ORAL
  Filled 2012-08-28: qty 1

## 2012-08-28 MED ORDER — IBUPROFEN 600 MG PO TABS: 600.0000 mg | ORAL_TABLET | Freq: Four times a day (QID) | ORAL | Status: DC | PRN

## 2012-08-28 MED ORDER — HYDROCODONE-ACETAMINOPHEN 5-325 MG PO TABS: 2.0000 | ORAL_TABLET | Freq: Four times a day (QID) | ORAL | Status: DC | PRN

## 2012-08-28 NOTE — ED Provider Notes (Signed)
History     CSN: [id number]  Arrival date & time 08/28/12  4098   First MD Initiated Contact with Patient 08/28/12 [phone]      Chief Complaint  Patient presents with  . Back Pain    (Consider location/radiation/quality/duration/timing/severity/associated sxs/prior treatment) HPI Comments: Patient presents to the emergency department with a chief complaint of back pain x2 days. She denies any mechanism of injury, but states that her pain is worsened when she twists or moves. She states that the pain is moderate to severe. She is tried taking Aleve with little relief. She denies any bowel or bladder incontinence. Denies any saddle anesthesia. She is able to ambulate, however is painful. She denies any fevers, chills, nausea, or vomiting. Denies any ataxia.  The history is provided by the patient. No language interpreter was used.    Past Medical History  Diagnosis Date  . Hypertension   . GERD (gastroesophageal reflux disease)   . Diverticulitis     Past Surgical History  Procedure Laterality Date  . Cholecystectomy    . Abdominal hysterectomy      Family History  Problem Relation Age of Onset  . Hypertension Other   . Diabetes Other   . Cancer Other     History  Substance Use Topics  . Smoking status: Never Smoker   . Smokeless tobacco: Not on file  . Alcohol Use: Yes     Comment: rare    OB History   Grav Para Term Preterm Abortions TAB SAB Ect Mult Living                  Review of Systems  All other systems reviewed and are negative.    Allergies  Aspirin  Home Medications   Current Outpatient Rx  Name  Route  Sig  Dispense  Refill  . acidophilus (RISAQUAD) CAPS   Oral   Take 1 capsule by mouth every morning.         Marland Kitchen ALPRAZolam (XANAX) 0.5 MG tablet   Oral   Take 0.5 mg by mouth daily as needed for anxiety. For anxiety         . amLODipine (NORVASC) 5 MG tablet   Oral   Take 5 mg by mouth every morning.          Marland Kitchen buPROPion  (WELLBUTRIN XL) 300 MG 24 hr tablet   Oral   Take 300 mg by mouth every morning.          . Cimetidine (TAGAMET PO)   Oral   Take 1 tablet by mouth once.         . Multiple Vitamin (MULITIVITAMIN WITH MINERALS) TABS   Oral   Take 1 tablet by mouth every morning.          . ondansetron (ZOFRAN-ODT) 8 MG disintegrating tablet   Oral   Take 1 tablet (8 mg total) by mouth every 8 (eight) hours as needed for nausea.   10 tablet   0   . oxyCODONE-acetaminophen (PERCOCET/ROXICET) 5-325 MG per tablet   Oral   Take 1-2 tablets by mouth every 6 (six) hours as needed for pain.   8 tablet   0   . triamterene-hydrochlorothiazide (DYAZIDE) 37.5-25 MG per capsule   Oral   Take 1 capsule by mouth every morning.           BP 118/80  Pulse 93  Temp(Src) 98.3 F (36.8 C) (Oral)  Resp 20  SpO2 97%  Physical  Exam  Nursing note and vitals reviewed. Constitutional: She is oriented to person, place, and time. She appears well-developed and well-nourished. No distress.  HENT:  Head: Normocephalic and atraumatic.  Eyes: Conjunctivae and EOM are normal. Right eye exhibits no discharge. Left eye exhibits no discharge. No scleral icterus.  Neck: Normal range of motion. Neck supple. No tracheal deviation present.  Cardiovascular: Normal rate, regular rhythm and normal heart sounds.  Exam reveals no gallop and no friction rub.   No murmur heard. Pulmonary/Chest: Effort normal and breath sounds normal. No respiratory distress. She has no wheezes.  Abdominal: Soft. She exhibits no distension. There is no tenderness.  Musculoskeletal: Normal range of motion.  Lumbar paraspinal muscles tender to palpation, no bony tenderness, step-offs, or gross abnormality or deformity of spine, patient is able to ambulate, moves all extremities  Neurological: She is alert and oriented to person, place, and time.  Sensation and strength intact bilaterally  Skin: Skin is warm. She is not diaphoretic.   Psychiatric: She has a normal mood and affect. Her behavior is normal. Judgment and thought content normal.    ED Course  Procedures (including critical care time)  Labs Reviewed - No data to display No results found.   1. Back pain   2. Muscle spasm       MDM  Patient with back pain.  No neurological deficits and normal neuro exam.  Patient can walk but states is painful.  No loss of bowel or bladder control.  No concern for cauda equina.  No fever, night sweats, weight loss, h/o cancer, IVDU.  RICE protocol and pain medicine indicated and discussed with patient.  Patient's pain is significantly improved.  Discharged to home in good condition.          Roxy Horseman, PA-C 08/28/12 1024

## 2012-08-28 NOTE — ED Notes (Addendum)
Pt reports low back pain that radiates into B/L hips. Pt reports pain increases with movement of right leg. Pt tried Aleve and [employer] yesterday with little relief. Pt denies recent injury.

## 2012-08-29 ENCOUNTER — Other Ambulatory Visit: Payer: Self-pay

## 2012-08-29 DIAGNOSIS — Z1231 Encounter for screening mammogram for malignant neoplasm of breast: Secondary | ICD-10-CM

## 2012-08-30 NOTE — ED Provider Notes (Signed)
Medical screening examination/treatment/procedure(s) were performed by non-physician practitioner and as supervising physician I was immediately available for consultation/collaboration.  Arnitra Sokoloski, MD 08/30/12 0040 

## 2012-09-07 IMAGING — RF DG UGI W/O KUB
1 series · 15 of 18 positions shown · non-contrast
Comparison: none

REASON FOR EXAM: hypertension GERD reflux palpitations anxiety depression
morbid obesity
COMMENTS:

[Series 1: run · 15 of 18 slices shown]
[im 1/18]
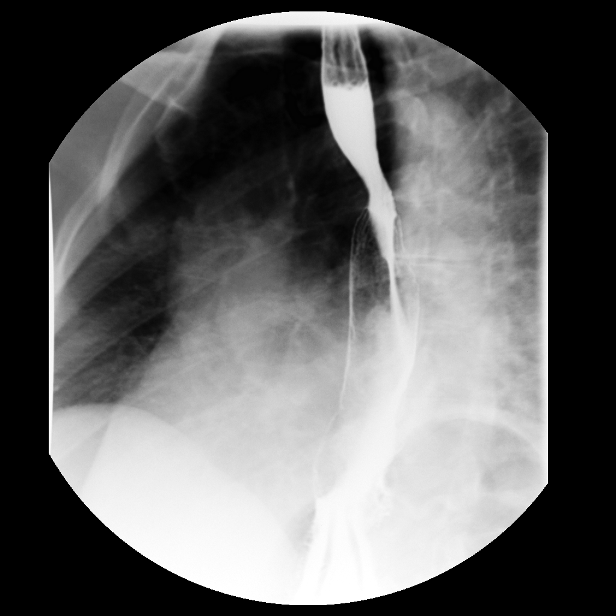
[im 2/18]
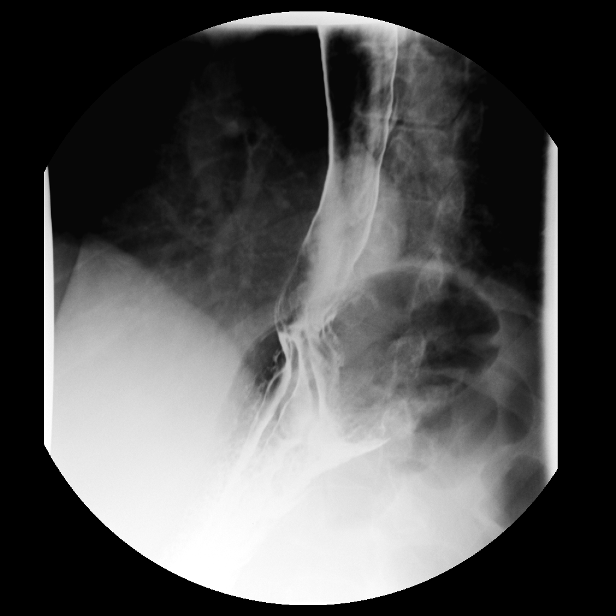
[im 4/18]
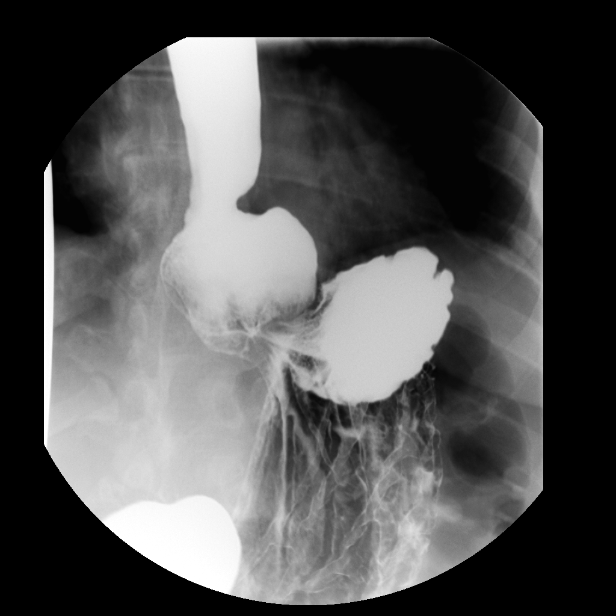
[im 5/18]
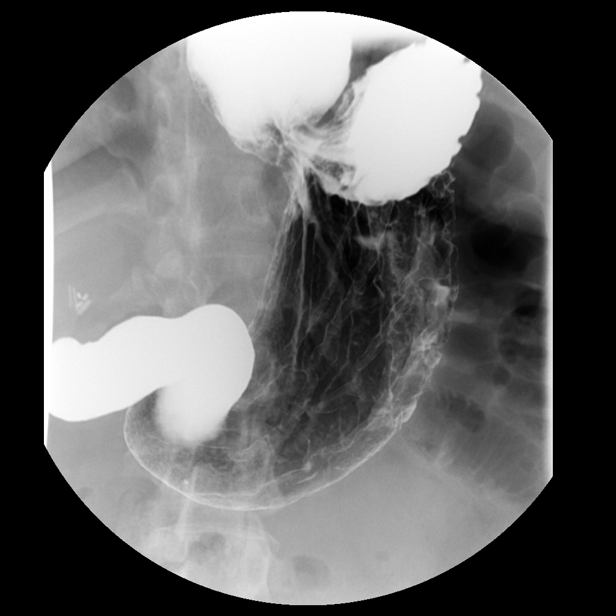
[im 6/18]
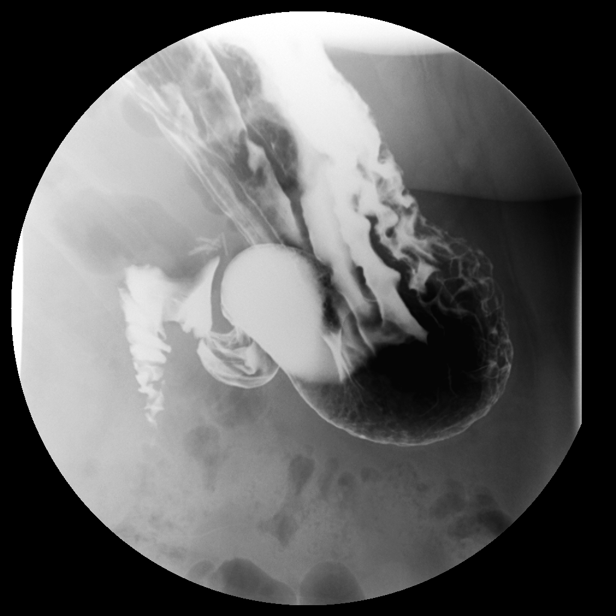
[im 7/18]
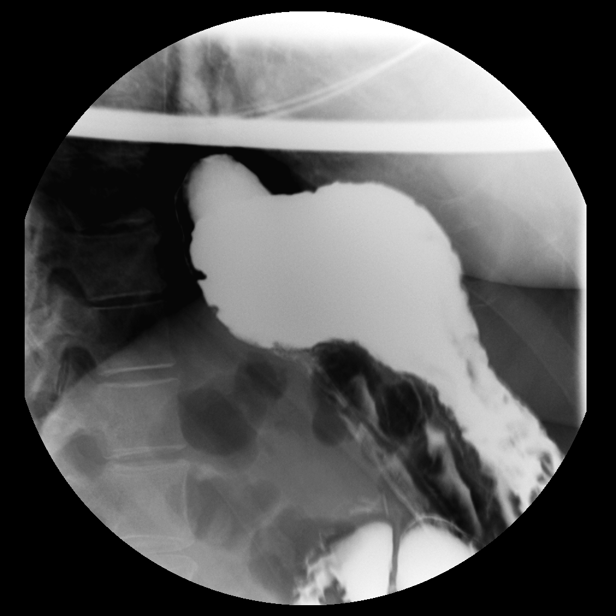
[im 8/18]
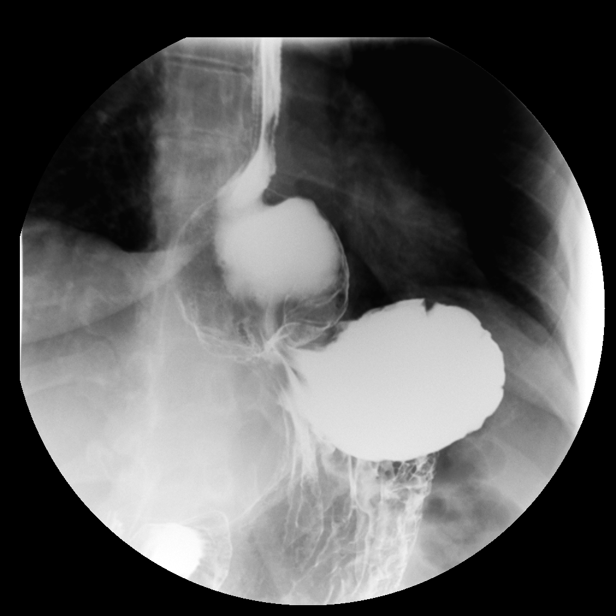
[im 10/18]
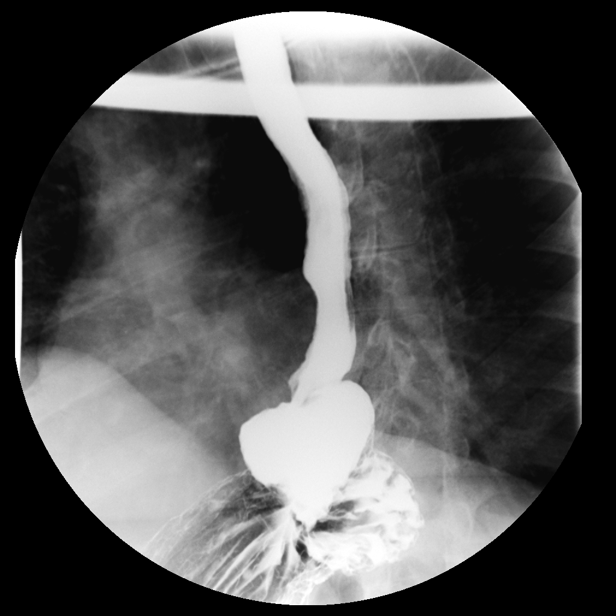
[im 11/18]
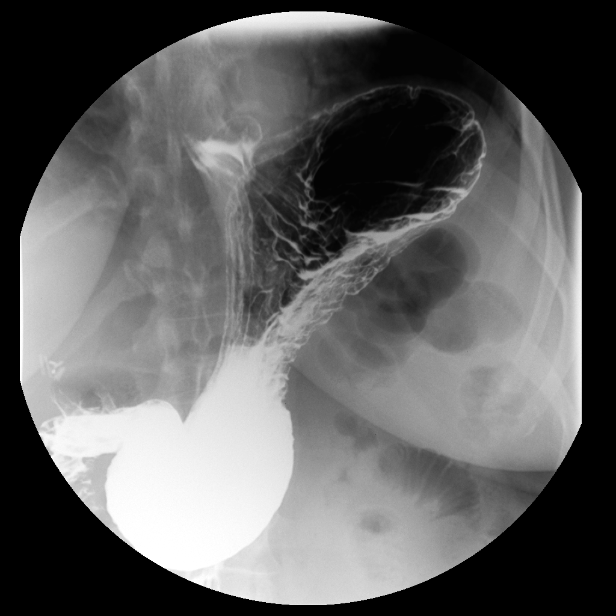
[im 12/18]
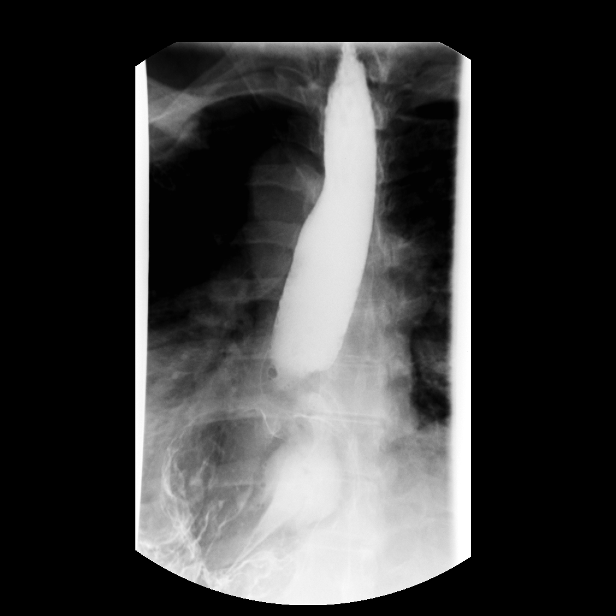
[im 13/18]
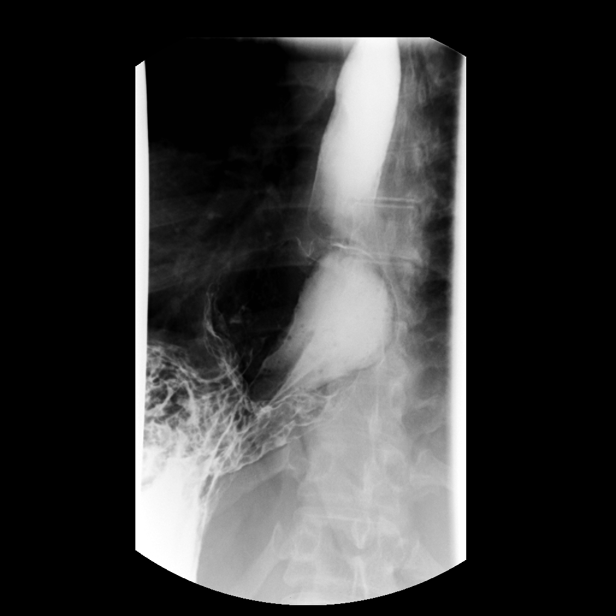
[im 14/18]
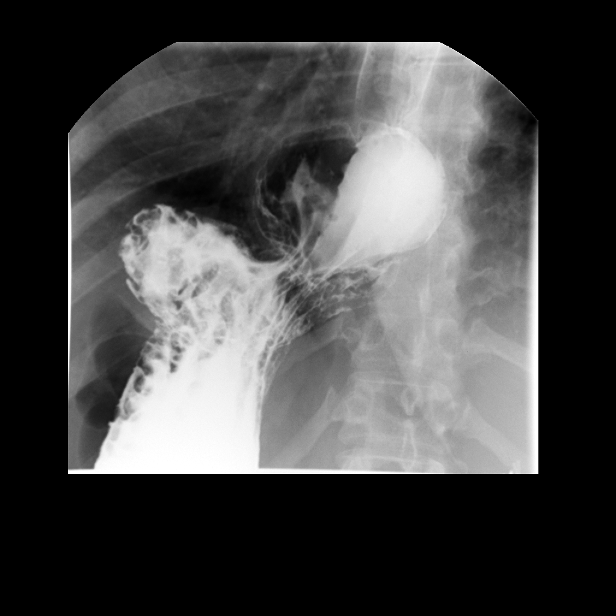
[im 16/18]
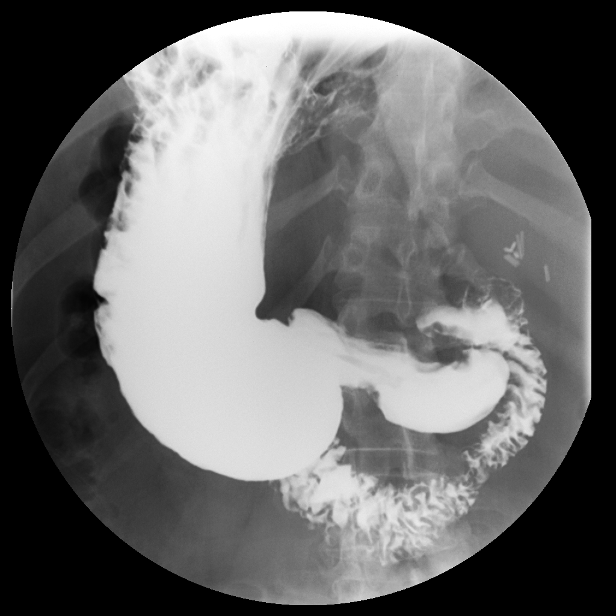
[im 17/18]
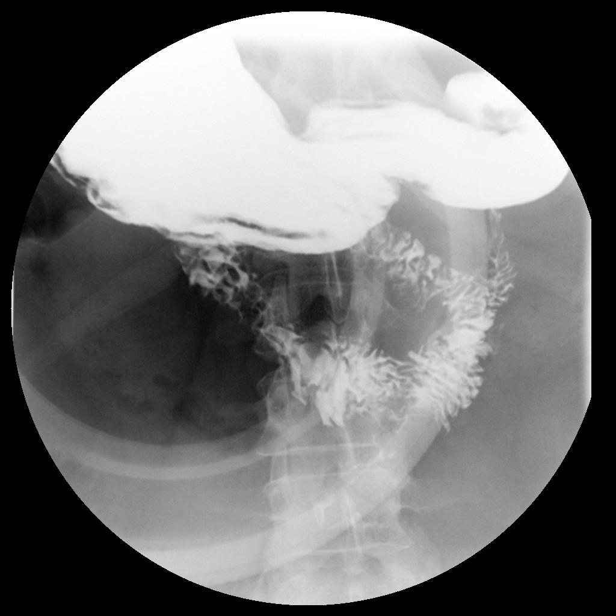
[im 18/18]
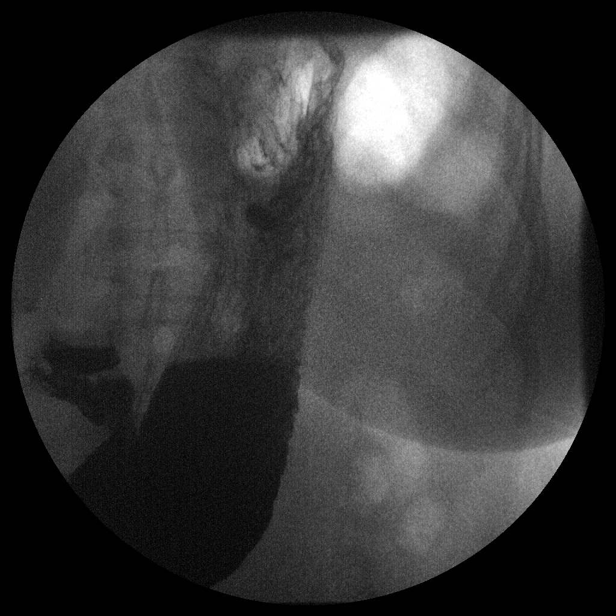

[15 of 18 positions shown; findings below may reference images not displayed]

PROCEDURE:     FL  - FL UPPER GI  - August 11, 2011  [DATE]

RESULT:     The anticipated procedure was discussed with Ms. Goti. She
voiced her willingness to proceed. The patient ingested thick and thin
barium and gas-forming crystals without difficulty. There is a
moderate-sized reducible hiatal hernia. The esophageal mucosa was normal in
appearance where visualized as was the gastric mucosa. Gastric emptying
appeared normal. The duodenal bulb and C-sweep were normal in appearance.
The 12 mm barium pill passed without difficulty. In the prone position
esophageal peristalsis was normal.
IMPRESSION: 1. There is a moderate sized reducible hiatal hernia.
2. Otherwise the appearance of the esophagus is normal.
3. The stomach and proximal duodenum were also normal in appearance.

## 2012-09-12 ENCOUNTER — Ambulatory Visit: Payer: Self-pay | Admitting: Internal Medicine

## 2012-09-19 ENCOUNTER — Ambulatory Visit: Payer: PRIVATE HEALTH INSURANCE

## 2012-12-24 ENCOUNTER — Emergency Department (INDEPENDENT_AMBULATORY_CARE_PROVIDER_SITE_OTHER): Payer: PRIVATE HEALTH INSURANCE

## 2012-12-24 ENCOUNTER — Encounter (HOSPITAL_COMMUNITY): Payer: Self-pay | Admitting: Emergency Medicine

## 2012-12-24 ENCOUNTER — Emergency Department (INDEPENDENT_AMBULATORY_CARE_PROVIDER_SITE_OTHER)
Admission: EM | Admit: 2012-12-24 | Discharge: 2012-12-24 | Disposition: A | Discharge status: Home / Self Care | Payer: Self-pay | Source: Home / Self Care | Attending: Emergency Medicine | Admitting: Emergency Medicine

## 2012-12-24 DIAGNOSIS — J45909 Unspecified asthma, uncomplicated: Secondary | ICD-10-CM

## 2012-12-24 LAB — POCT RAPID STREP A: Streptococcus, Group A Screen (Direct): NEGATIVE

## 2012-12-24 MED ORDER — HYDROCOD POLST-CHLORPHEN POLST 10-8 MG/5ML PO LQCR: 5.0000 mL | Freq: Two times a day (BID) | ORAL | Status: DC | PRN

## 2012-12-24 MED ORDER — OXYCODONE-ACETAMINOPHEN 5-325 MG PO TABS: ORAL_TABLET | ORAL | Status: DC

## 2012-12-24 MED ORDER — ALBUTEROL SULFATE HFA 108 (90 BASE) MCG/ACT IN AERS: 2.0000 | INHALATION_SPRAY | RESPIRATORY_TRACT | Status: DC | PRN

## 2012-12-24 MED ORDER — PREDNISONE 20 MG PO TABS: 20.0000 mg | ORAL_TABLET | Freq: Two times a day (BID) | ORAL | Status: DC

## 2012-12-24 NOTE — ED Notes (Signed)
Persistent cough and sore throat.  Patient thinks throat hurts secondary to frequent coughing.  Patient reports onset of symptoms was 2 days ago with nasal congestion and now chest congestion and body aches, denies having fever.

## 2012-12-24 NOTE — ED Notes (Signed)
Provided ice chips.  

## 2012-12-24 NOTE — ED Provider Notes (Signed)
Chief Complaint:  No chief complaint on file.   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 She also had sore throat, hoarseness, nasal congestion with clear drainage, sinus pressure, and aches. She denies any fever, chills, sweats, wheezing, headache, earache, or GI symptoms. She's had no known exposures. No history of asthma.  Review of Systems:  Other than noted above, the patient denies any of the following symptoms: Systemic:  No fevers, chills, sweats, weight loss or gain, fatigue, or tiredness. Eye:  No redness or discharge. ENT:  No ear pain, drainage, headache, nasal congestion, drainage, sinus pressure, difficulty swallowing, or sore throat. Neck:  No neck pain or swollen glands. Lungs:  No cough, sputum production, hemoptysis, wheezing, chest tightness, shortness of breath or chest pain. GI:  No abdominal pain, nausea, vomiting or diarrhea.  PMFSH:  Past medical history, family history, social history, meds, and allergies were reviewed. She's allergic to aspirin which causes her to break out in hives. She takes Nexium, Norvasc, and Maxzide. She has high blood pressure and reflux esophagitis.  Physical Exam:   Vital signs:  BP 116/94  Pulse 94  Temp(Src) 98.3 F (36.8 C) (Oral)  Resp 24  SpO2 97% General:  Alert and oriented.  In no distress.  Skin warm and dry. Eye:  No conjunctival injection or drainage. Lids were normal. ENT:  TMs and canals were normal, without erythema or inflammation.  Nasal mucosa was clear and uncongested, without drainage.  Mucous membranes were moist.  Tonsils were enlarged and red with no exudate or drainage.  There were no oral ulcerations or lesions. Neck:  Supple, no adenopathy, tenderness or mass. Lungs:  No respiratory distress.  Auscultation of lungs reveals  scattered expiratory wheezes. No rales or rhonchi.  Heart:  Regular rhythm, without gallops, murmers or rubs. Skin:  Clear, warm, and dry, without rash or lesions.  Labs:   Results for orders placed during the hospital encounter of 12/24/12  POCT RAPID STREP A ([facility] CARE ONLY)      Result Value Range   Streptococcus, Group A Screen (Direct) NEGATIVE  NEGATIVE     Radiology:  Dg Chest 2 View  12/24/2012   CLINICAL DATA:  Persistent cough.  EXAM: CHEST  2 VIEW  COMPARISON:  06/23/2012  FINDINGS: Small hiatal hernia. Heart and mediastinal contours are within normal limits. No focal opacities or effusions. No acute bony abnormality.  IMPRESSION: No active cardiopulmonary disease.   Electronically Signed   By: Charlett Nose M.D.   On: 12/24/2012 17:28   Assessment:  The encounter diagnosis was Asthmatic bronchitis.  No evidence of pneumonia.  Plan:   1.  Meds:  The following meds were prescribed:   Discharge Medication List as of 12/24/2012  5:44 PM    START taking these medications   Details  albuterol (PROVENTIL HFA;VENTOLIN HFA) 108 (90 BASE) MCG/ACT inhaler Inhale 2 puffs into the lungs every 4 (four) hours as needed for wheezing., Starting 12/24/2012, Until Discontinued, Normal    chlorpheniramine-HYDROcodone (TUSSIONEX) 10-8 MG/5ML LQCR Take 5 mLs by mouth every 12 (twelve) hours as needed., Starting 12/24/2012, Until Discontinued, Normal    oxyCODONE-acetaminophen (PERCOCET) 5-325 MG per tablet 1 to 2 tablets every 6 hours as needed for pain., Print    predniSONE (DELTASONE) 20 MG  tablet Take 1 tablet (20 mg total) by mouth 2 (two) times daily., Starting 12/24/2012, Until Discontinued, Normal        2.  Patient Education/Counseling:  The patient was given appropriate handouts, self care instructions, and instructed in symptomatic relief.   3.  Follow up:  The patient was told to follow up if no better in 3 to 4 days, if becoming worse in any way, and given some red flag  symptoms such as difficulty breathing which would prompt immediate return.  Follow up here as necessary.      Reuben Likes, MD 12/24/12 2007

## 2012-12-26 LAB — CULTURE, GROUP A STREP

## 2013-03-13 ENCOUNTER — Encounter: Payer: Self-pay | Admitting: Medical

## 2013-03-17 ENCOUNTER — Encounter: Payer: Self-pay | Admitting: Medical

## 2013-03-30 ENCOUNTER — Encounter: Payer: Self-pay | Admitting: Medical

## 2013-05-18 ENCOUNTER — Emergency Department (HOSPITAL_COMMUNITY): Payer: No Typology Code available for payment source

## 2013-05-18 ENCOUNTER — Emergency Department (HOSPITAL_COMMUNITY)
Admission: EM | Admit: 2013-05-18 | Discharge: 2013-05-18 | Disposition: A | Discharge status: Home / Self Care | Payer: No Typology Code available for payment source | Attending: Emergency Medicine | Admitting: Emergency Medicine

## 2013-05-18 ENCOUNTER — Encounter (HOSPITAL_COMMUNITY): Payer: Self-pay | Admitting: Emergency Medicine

## 2013-05-18 DIAGNOSIS — R079 Chest pain, unspecified: Secondary | ICD-10-CM | POA: Insufficient documentation

## 2013-05-18 DIAGNOSIS — R1110 Vomiting, unspecified: Secondary | ICD-10-CM | POA: Insufficient documentation

## 2013-05-18 DIAGNOSIS — Z79899 Other long term (current) drug therapy: Secondary | ICD-10-CM | POA: Insufficient documentation

## 2013-05-18 DIAGNOSIS — M542 Cervicalgia: Secondary | ICD-10-CM | POA: Insufficient documentation

## 2013-05-18 DIAGNOSIS — K219 Gastro-esophageal reflux disease without esophagitis: Secondary | ICD-10-CM | POA: Insufficient documentation

## 2013-05-18 DIAGNOSIS — M2550 Pain in unspecified joint: Secondary | ICD-10-CM | POA: Insufficient documentation

## 2013-05-18 DIAGNOSIS — I10 Essential (primary) hypertension: Secondary | ICD-10-CM | POA: Insufficient documentation

## 2013-05-18 DIAGNOSIS — R011 Cardiac murmur, unspecified: Secondary | ICD-10-CM | POA: Insufficient documentation

## 2013-05-18 LAB — COMPREHENSIVE METABOLIC PANEL
ALT: 20 U/L (ref 0–35)
AST: 26 U/L (ref 0–37)
Albumin: 3.5 g/dL (ref 3.5–5.2)
Alkaline Phosphatase: 73 U/L (ref 39–117)
BUN: 12 mg/dL (ref 6–23)
CO2: 21 mEq/L (ref 19–32)
Calcium: 9.1 mg/dL (ref 8.4–10.5)
Chloride: 104 mEq/L (ref 96–112)
Creatinine, Ser: 0.79 mg/dL (ref 0.50–1.10)
GFR calc Af Amer: >90 mL/min (ref >90)
GFR calc non Af Amer: >90 mL/min (ref >90)
Glucose, Bld: 110 mg/dL — ABNORMAL HIGH (ref 70–99)
Potassium: 3.7 mEq/L (ref 3.7–5.3)
Sodium: 139 mEq/L (ref 137–147)
Total Bilirubin: 0.3 mg/dL (ref 0.3–1.2)
Total Protein: 7.6 g/dL (ref 6.0–8.3)

## 2013-05-18 LAB — I-STAT TROPONIN, ED: Troponin i, poc: 0 ng/mL (ref 0.00–0.08)

## 2013-05-18 LAB — CBC
HCT: 41 % (ref 36.0–46.0)
Hemoglobin: 14 g/dL (ref 12.0–15.0)
MCH: 28.1 pg (ref 26.0–34.0)
MCHC: 34.1 g/dL (ref 30.0–36.0)
MCV: 82.2 fL (ref 78.0–100.0)
Platelets: 344 10*3/uL (ref 150–400)
RBC: 4.99 MIL/uL (ref 3.87–5.11)
RDW: 15.6 % — ABNORMAL HIGH (ref 11.5–15.5)
WBC: 8.5 10*3/uL (ref 4.0–10.5)

## 2013-05-18 LAB — TROPONIN I: Troponin I: <0.3 ng/mL (ref <0.30)

## 2013-05-18 MED ORDER — MORPHINE SULFATE 4 MG/ML IJ SOLN
4.0000 mg | Freq: Once | INTRAMUSCULAR | Status: AC
  Administered 2013-05-18: 4 mg via INTRAVENOUS
  Filled 2013-05-18: qty 1

## 2013-05-18 MED ORDER — ONDANSETRON HCL 4 MG/2ML IJ SOLN
4.0000 mg | Freq: Once | INTRAMUSCULAR | Status: AC
  Administered 2013-05-18: 4 mg via INTRAVENOUS
  Filled 2013-05-18: qty 2

## 2013-05-18 MED ORDER — SODIUM CHLORIDE 0.9 % IV BOLUS (SEPSIS)
1000.0000 mL | Freq: Once | INTRAVENOUS | Status: AC
  Administered 2013-05-18: 1000 mL via INTRAVENOUS

## 2013-05-18 NOTE — ED Notes (Signed)
Urine sample is placed at bedside if needed.

## 2013-05-18 NOTE — ED Notes (Signed)
Patient returned from X-ray 

## 2013-05-18 NOTE — ED Notes (Signed)
Patient transported to X-ray 

## 2013-05-18 NOTE — ED Provider Notes (Signed)
CSN: [id number]     Arrival date & time 05/18/13  1250 History   First MD Initiated Contact with Patient 05/18/13 1333     Chief Complaint  Patient presents with  . Chest Pain     (Consider location/radiation/quality/duration/timing/severity/associated sxs/prior Treatment) HPI Comments: Yolanda Brown is a 53 y.o. female with a past medical history of HTN, GERD presenting the Emergency Department with a chief complaint of left sided chest discomfort since 1700 last night, worsening today.  She reports intermittent chest pressure lasting 30 minutes, that radiates to left arm and into the jaw. She reports associated diaphoresis, and nausea without emesis.The patient reports onset of discomfort while watching TV.  She reports taking ASA prior to arrival. Denies SOB, cough, palpations, lower extremity swelling. FHx: mother passed away due to a heart attack in 16's.  History of stress test 01/06/2010, negative.  Patient is a 53 y.o. female presenting with chest pain. The history is provided by the patient and medical records. No language interpreter was used.  Chest Pain Associated symptoms: nausea   Associated symptoms: no abdominal pain, no cough, no fever, no numbness, no palpitations, no shortness of breath, not vomiting and no weakness     Past Medical History  Diagnosis Date  . Hypertension   . GERD (gastroesophageal reflux disease)   . Diverticulitis    Past Surgical History  Procedure Laterality Date  . Cholecystectomy    . Abdominal hysterectomy     Family History  Problem Relation Age of Onset  . Hypertension Other   . Diabetes Other   . Cancer Other    History  Substance Use Topics  . Smoking status: Never Smoker   . Smokeless tobacco: Not on file  . Alcohol Use: Yes     Comment: rare   OB History   Grav Para Term Preterm Abortions TAB SAB Ect Mult Living                 Review of Systems  Constitutional: Negative for fever and chills.  Respiratory: Negative for  cough, chest tightness, shortness of breath and wheezing.   Cardiovascular: Positive for chest pain. Negative for palpitations and leg swelling.  Gastrointestinal: Positive for nausea. Negative for vomiting, abdominal pain, diarrhea and constipation.  Musculoskeletal: Positive for arthralgias and neck pain.  Neurological: Negative for weakness, light-headedness and numbness.      Allergies  Aspirin  Home Medications   Current Outpatient Rx  Name  Route  Sig  Dispense  Refill  . albuterol (PROVENTIL HFA;VENTOLIN HFA) 108 (90 BASE) MCG/ACT inhaler   Inhalation   Inhale 2 puffs into the lungs every 4 (four) hours as needed for wheezing.   1 Inhaler   0   . ALPRAZolam (XANAX) 0.5 MG tablet   Oral   Take 0.5 mg by mouth daily as needed for anxiety. For anxiety         . amLODipine (NORVASC) 5 MG tablet   Oral   Take 5 mg by mouth every morning.          . B Complex Vitamins (VITAMIN B COMPLEX PO)   Oral   Take 1 tablet by mouth daily.         Marland Kitchen esomeprazole (NEXIUM) 40 MG capsule   Oral   Take 40 mg by mouth daily before breakfast.         . Multiple Vitamin (MULITIVITAMIN WITH MINERALS) TABS   Oral   Take 1 tablet by mouth every  morning.          . triamterene-hydrochlorothiazide (DYAZIDE) 37.5-25 MG per capsule   Oral   Take 1 capsule by mouth every morning.          BP 121/79  Pulse 73  Temp(Src) 98.2 F (36.8 C) (Oral)  Resp 16  Ht 5\' 6"  (1.676 m)  Wt 250 lb (113.399 kg)  BMI 40.37 kg/m2  SpO2 94% Physical Exam  Nursing note and vitals reviewed. Constitutional: She is oriented to person, place, and time. She appears well-developed and well-nourished.  Appears uncomfortable.  HENT:  Head: Normocephalic and atraumatic.  Neck: Neck supple.  Cardiovascular: Normal rate and regular rhythm.   Murmur heard. Pulses:      Radial pulses are 2+ on the right side, and 2+ on the left side.  No lower extremity edema  Pulmonary/Chest: Effort normal and  breath sounds normal. No respiratory distress. She has no wheezes. She has no rales. She exhibits no tenderness.  Abdominal: Soft. There is no tenderness. There is no rebound and no guarding.  Musculoskeletal: Normal range of motion.  Neurological: She is alert and oriented to person, place, and time. She has normal strength. No sensory deficit.  Moves all 4 extremities.   Skin: Skin is warm and dry. She is not diaphoretic.  Psychiatric: She has a normal mood and affect. Her behavior is normal. Thought content normal.    ED Course  Procedures (including critical care time) Labs Review Labs Reviewed  CBC - Abnormal; Notable for the following:    RDW 15.6 (*)    All other components within normal limits  COMPREHENSIVE METABOLIC PANEL - Abnormal; Notable for the following:    Glucose, Bld 110 (*)    All other components within normal limits  TROPONIN I  Rosezena SensorI-STAT TROPOININ, ED   Imaging Review Dg Chest 2 View  05/18/2013   CLINICAL DATA:  Chest pain  EXAM: CHEST  2 VIEW  COMPARISON:  December 24, 2012  FINDINGS: The lungs are clear. Heart size and pulmonary vascularity are normal. No adenopathy. No pneumothorax. There is a focal hiatal hernia. There is degenerative change in the thoracic spine.  IMPRESSION: There is a hiatal hernia present. There is no edema or consolidation.   Electronically Signed   By: Bretta BangWilliam  Woodruff M.D.   On: 05/18/2013 14:41     EKG Interpretation None      EKG:  Date: 05/18/2013  Rate: 60  Rhythm: normal sinus rhythm  QRS Axis: normal  Intervals: normal  ST/T Wave abnormalities: normal  Conduction Disutrbances:none  Narrative Interpretation:   Old EKG Reviewed: unchanged    MDM   Final diagnoses:  Chest pain   Pt with left sided chest pain since last night.   EKG shows T wave inversion in Lead I,avL  it shows T wave, and P wave inversion. Will repeat EKG. Pt SpO2 >95% RA. Chest XR without acute findings. CBC, BMP both WNL Troponin  negative. Re-eval: Pt reports chest discomfort has improved 7/10 with morphine x2. Discussed patient history, condition, and labs with Dr. Judd Lienelo, who agrees on current evaluation of the patient.  Repeat EKG shows NSR without T wave inversion in I and aVL.  P wave and QRS are now up in those leads as well. Discussed these findings with the patient.  Troponin x2 negative. Discussed lab results, imaging results, and treatment plan with the patient. Return precautions given. Reports understanding and no other concerns at this time.  Patient is stable  for discharge at this time.   Meds given in ED:  Medications  morphine 4 MG/ML injection 4 mg (4 mg Intravenous Given 05/18/13 1420)  sodium chloride 0.9 % bolus 1,000 mL (0 mLs Intravenous Stopped 05/18/13 1530)  ondansetron (ZOFRAN) injection 4 mg (4 mg Intravenous Given 05/18/13 1420)  morphine 4 MG/ML injection 4 mg (4 mg Intravenous Given 05/18/13 1453)    Discharge Medication List as of 05/18/2013  4:54 PM        Leotis Shames Doretha Imus, PA-C 05/19/13 2222

## 2013-05-18 NOTE — ED Notes (Signed)
PT c/o L sided chest pain that radiates to L arm and L jaw. Today pt began experiencing nausea, so she came here.  No cardiac hx.

## 2013-05-18 NOTE — Discharge Instructions (Signed)
Call [facility] for further evaluation of your chest pain.  Call for a follow up appointment with a Family or Primary Care Provider.  Return if Symptoms worsen.   Take medication as prescribed.

## 2013-05-20 NOTE — ED Provider Notes (Signed)
Medical screening examination/treatment/procedure(s) were performed by non-physician practitioner and as supervising physician I was immediately available for consultation/collaboration.   EKG Interpretation   Date/Time:  Thursday May 18 2013 16:01:53 EDT Ventricular Rate:  60 PR Interval:  189 QRS Duration: 92 QT Interval:  437 QTC Calculation: 437 R Axis:   30 Text Interpretation:  Sinus rhythm Low voltage, precordial leads ECG  OTHERWISE WITHIN NORMAL LIMITS Confirmed by Bebe ShaggyWICKLINE  MD, DONALD ([id number])  on 05/18/2013 4:26:50 PM       Geoffery Lyonsouglas Jeremian Whitby, MD 05/20/13 [phone]

## 2013-05-29 ENCOUNTER — Encounter: Payer: Self-pay | Admitting: Medical

## 2013-05-29 ENCOUNTER — Ambulatory Visit (INDEPENDENT_AMBULATORY_CARE_PROVIDER_SITE_OTHER): Payer: No Typology Code available for payment source | Admitting: Medical

## 2013-05-29 VITALS — BP 132/90 | HR 88 | Temp 98.1°F | Resp 16 | Ht 67.0 in | Wt 250.0 lb

## 2013-05-29 DIAGNOSIS — K219 Gastro-esophageal reflux disease without esophagitis: Secondary | ICD-10-CM

## 2013-05-29 DIAGNOSIS — F418 Other specified anxiety disorders: Secondary | ICD-10-CM

## 2013-05-29 DIAGNOSIS — F341 Dysthymic disorder: Secondary | ICD-10-CM

## 2013-05-29 DIAGNOSIS — I10 Essential (primary) hypertension: Secondary | ICD-10-CM

## 2013-05-29 MED ORDER — AMLODIPINE BESYLATE 5 MG PO TABS: 5.0000 mg | ORAL_TABLET | Freq: Every day | ORAL | Status: DC

## 2013-05-29 MED ORDER — TRIAMTERENE-HCTZ 37.5-25 MG PO CAPS: 1.0000 | ORAL_CAPSULE | Freq: Every morning | ORAL | Status: DC

## 2013-05-29 MED ORDER — BUPROPION HCL ER (XL) 300 MG PO TB24: 300.0000 mg | ORAL_TABLET | Freq: Every day | ORAL | Status: DC

## 2013-05-29 MED ORDER — ALPRAZOLAM 0.5 MG PO TABS: 0.5000 mg | ORAL_TABLET | Freq: Every day | ORAL | Status: DC | PRN

## 2013-05-29 NOTE — Progress Notes (Signed)
   Subjective:   Stephania FragminFaith B Nairn is a 53 y.o. female presenting on 05/29/2013 with establish as a new patient and Medication Refill  Here as a new patient.   Works as a travel Engineer, civil (consulting)nurse.  [place] in [place]   [facility] [place] prior, but didn't want to travel so far.    HTN - takes Norvasc, Dyazide but ran out 2 wk ago.   Checks BP reguarly, usually good except until ran out.  Depression and anxiety - diagnosed by Dr. Evelene CroonKaur 2013, but was just seeing primary care provider for refills.   Takes Wellbutrin 300mg  XL, xanax prn, occasional.  Currently not seeing counseling.  Hx/o husband dropping out in front of her   GERD - nexium.  Was on dexilant but was too expensive.  No other complaint.  Review of Systems ROS as in subjective      Objective:     Filed Vitals:   05/29/13 1030  BP: 132/90  Pulse: 88  Temp: 98.1 F (36.7 C)  Resp: 16    General appearance: alert, no distress, WD/WN, AA female Neck: supple, no lymphadenopathy, no thyromegaly, no masses Heart: RRR, normal S1, S2, no murmurs Lungs: CTA bilaterally, no wheezes, rhonchi, or rales Abdomen: +bs, soft, non tender, non distended, no masses, no hepatomegaly, no splenomegaly Pulses: 2+ symmetric, upper and lower extremities, normal cap refill Ext: no edema     Assessment: Encounter Diagnoses  Name Primary?  . Essential hypertension, benign Yes  . Depression with anxiety   . GERD (gastroesophageal reflux disease)      Plan: Discussed her concerns, prior diagnoses, treatments prior.  Begin back on medications.  Recheck 28mo for CPX, fasting labs and on medications.  Kristelle was seen today for establish as a new patient and medication refill.  Diagnoses and associated orders for this visit:  Essential hypertension, benign  Depression with anxiety  GERD (gastroesophageal reflux disease)  Other Orders - ALPRAZolam (XANAX) 0.5 MG tablet; Take 1 tablet (0.5 mg total) by mouth daily as needed for  anxiety. For anxiety - amLODipine (NORVASC) 5 MG tablet; Take 1 tablet (5 mg total) by mouth daily. - buPROPion (WELLBUTRIN XL) 300 MG 24 hr tablet; Take 1 tablet (300 mg total) by mouth daily. - triamterene-hydrochlorothiazide (DYAZIDE) 37.5-25 MG per capsule; Take 1 each (1 capsule total) by mouth every morning.    Return 28mo.

## 2013-06-01 ENCOUNTER — Ambulatory Visit (INDEPENDENT_AMBULATORY_CARE_PROVIDER_SITE_OTHER): Payer: No Typology Code available for payment source | Admitting: Medical

## 2013-06-01 VITALS — BP 122/88 | HR 88 | Temp 98.2°F | Resp 16 | Wt 243.0 lb

## 2013-06-01 DIAGNOSIS — Z8719 Personal history of other diseases of the digestive system: Secondary | ICD-10-CM

## 2013-06-01 DIAGNOSIS — R21 Rash and other nonspecific skin eruption: Secondary | ICD-10-CM

## 2013-06-01 DIAGNOSIS — D72829 Elevated white blood cell count, unspecified: Secondary | ICD-10-CM

## 2013-06-01 DIAGNOSIS — R109 Unspecified abdominal pain: Secondary | ICD-10-CM

## 2013-06-01 LAB — POCT URINALYSIS DIPSTICK
Bilirubin, UA: NEGATIVE
Blood, UA: NEGATIVE
Glucose, UA: NEGATIVE
Ketones, UA: NEGATIVE
Leukocytes, UA: NEGATIVE
Nitrite, UA: NEGATIVE
Protein, UA: NEGATIVE
Spec Grav, UA: <=1.005
Urobilinogen, UA: NEGATIVE
pH, UA: 7

## 2013-06-01 MED ORDER — AMOXICILLIN-POT CLAVULANATE 875-125 MG PO TABS: 1.0000 | ORAL_TABLET | Freq: Two times a day (BID) | ORAL | Status: DC

## 2013-06-01 NOTE — Progress Notes (Signed)
    Subjective:   Stephania FragminFaith B Shimada is a 53 y.o. female presenting on 06/01/2013 with Rash, abnormal WBC and abd cramping  she had several symptoms that may or may not be related .  She notes 1 day history of lower abdominal cramping across the lower abdomen, that has been intermittent today, normal bowel movement today, but she worries this is like her prior episode of diverticulitis. She also saw weight loss clinic yesterday and reportedly her white count was in the 14,000 range elevated.  In addition she has a bumpy rash on her lower legs times the last 2 weeks not improved by Benadryl or hydrocortisone.  No other aggravating or relieving factors.  No other complaint.  Review of Systems Denies fever, nausea, vomiting, diarrhea, blood in stool, mucus in stool, chills, no urinary complaints, no shortness of breath or wheezing no chest pain no recent illness. No sick contacts with similar rash or other symptoms.      Objective:     Filed Vitals:   06/01/13 1512  BP: 122/88  Pulse: 88  Temp: 98.2 F (36.8 C)  Resp: 16    General appearance: alert, no distress, WD/WN Heart: RRR, normal S1, S2, no murmurs Lungs: CTA bilaterally, no wheezes, rhonchi, or rales Abdomen: +bs, soft, mild to moderate generalized lower abdominal tenderness, no rebound, non distended, no masses, no hepatomegaly, no splenomegaly Back: Nontender Pulses: 2+ symmetric, upper and lower extremities, normal cap refill Skin: scattered 2-3 mm raised papular lesions of bilat lower extremities      Assessment: Encounter Diagnoses  Name Primary?  . Abdominal pain, unspecified site Yes  . Rash and nonspecific skin eruption   . Leukocytosis, unspecified   . History of diverticulitis      Plan: Etiology not clear.  Will treat for folliculitis, possible early diverticulitis with Augmentin, probioitc, bowel rest, increase clear fluids, and call back tomorrow with symptom update.   UA normal.  Sidnie was seen today  for rash, abnormal wbc and abd cramping.  Diagnoses and associated orders for this visit:  Abdominal pain, unspecified site  Rash and nonspecific skin eruption  Leukocytosis, unspecified  History of diverticulitis    Return call back tomorrow.

## 2013-06-01 NOTE — Patient Instructions (Signed)
   Begin Augmentin antibiotic twice daily for rash and possible early diverticular infection  Do only clear liquids for next 24 hours  Gradually resume to bland diet, small portions  You can use Ibuprofen or Tylenol for pain  Call back tomorrow or Monday  You can use the Hydrocortisone cream for the rash

## 2013-06-07 DIAGNOSIS — K5792 Diverticulitis of intestine, part unspecified, without perforation or abscess without bleeding: Secondary | ICD-10-CM

## 2013-06-07 HISTORY — DX: Diverticulitis of intestine, part unspecified, without perforation or abscess without bleeding: K57.92

## 2013-06-10 ENCOUNTER — Encounter (HOSPITAL_COMMUNITY): Payer: Self-pay | Admitting: Emergency Medicine

## 2013-06-10 ENCOUNTER — Emergency Department (HOSPITAL_COMMUNITY): Payer: PRIVATE HEALTH INSURANCE

## 2013-06-10 ENCOUNTER — Emergency Department (HOSPITAL_COMMUNITY)
Admission: EM | Admit: 2013-06-10 | Discharge: 2013-06-10 | Disposition: A | Discharge status: Home / Self Care | Payer: PRIVATE HEALTH INSURANCE | Attending: Emergency Medicine | Admitting: Emergency Medicine

## 2013-06-10 DIAGNOSIS — Z792 Long term (current) use of antibiotics: Secondary | ICD-10-CM | POA: Insufficient documentation

## 2013-06-10 DIAGNOSIS — F411 Generalized anxiety disorder: Secondary | ICD-10-CM | POA: Insufficient documentation

## 2013-06-10 DIAGNOSIS — Z79899 Other long term (current) drug therapy: Secondary | ICD-10-CM | POA: Insufficient documentation

## 2013-06-10 DIAGNOSIS — Z9089 Acquired absence of other organs: Secondary | ICD-10-CM | POA: Insufficient documentation

## 2013-06-10 DIAGNOSIS — K5732 Diverticulitis of large intestine without perforation or abscess without bleeding: Secondary | ICD-10-CM | POA: Insufficient documentation

## 2013-06-10 DIAGNOSIS — F329 Major depressive disorder, single episode, unspecified: Secondary | ICD-10-CM | POA: Insufficient documentation

## 2013-06-10 DIAGNOSIS — I10 Essential (primary) hypertension: Secondary | ICD-10-CM | POA: Insufficient documentation

## 2013-06-10 DIAGNOSIS — K219 Gastro-esophageal reflux disease without esophagitis: Secondary | ICD-10-CM | POA: Insufficient documentation

## 2013-06-10 DIAGNOSIS — Z90710 Acquired absence of both cervix and uterus: Secondary | ICD-10-CM | POA: Insufficient documentation

## 2013-06-10 DIAGNOSIS — K5792 Diverticulitis of intestine, part unspecified, without perforation or abscess without bleeding: Secondary | ICD-10-CM

## 2013-06-10 DIAGNOSIS — F3289 Other specified depressive episodes: Secondary | ICD-10-CM | POA: Insufficient documentation

## 2013-06-10 LAB — COMPREHENSIVE METABOLIC PANEL
ALT: 26 U/L (ref 0–35)
AST: 37 U/L (ref 0–37)
Albumin: 3.7 g/dL (ref 3.5–5.2)
Alkaline Phosphatase: 77 U/L (ref 39–117)
BUN: 15 mg/dL (ref 6–23)
CO2: 19 mEq/L (ref 19–32)
Calcium: 9.3 mg/dL (ref 8.4–10.5)
Chloride: 101 mEq/L (ref 96–112)
Creatinine, Ser: 1.04 mg/dL (ref 0.50–1.10)
GFR calc Af Amer: 69 mL/min — ABNORMAL LOW (ref >90)
GFR calc non Af Amer: 59 mL/min — ABNORMAL LOW (ref >90)
Glucose, Bld: 108 mg/dL — ABNORMAL HIGH (ref 70–99)
Potassium: 3.9 mEq/L (ref 3.7–5.3)
Sodium: 136 mEq/L — ABNORMAL LOW (ref 137–147)
Total Bilirubin: <0.2 mg/dL — ABNORMAL LOW (ref 0.3–1.2)
Total Protein: 8 g/dL (ref 6.0–8.3)

## 2013-06-10 LAB — CBC WITH DIFFERENTIAL/PLATELET
Basophils Absolute: 0.1 10*3/uL (ref 0.0–0.1)
Basophils Relative: 1 % (ref 0–1)
Eosinophils Absolute: 0.4 10*3/uL (ref 0.0–0.7)
Eosinophils Relative: 4 % (ref 0–5)
HCT: 41.1 % (ref 36.0–46.0)
Hemoglobin: 12.4 g/dL (ref 12.0–15.0)
Lymphocytes Relative: 38 % (ref 12–46)
Lymphs Abs: 4 10*3/uL (ref 0.7–4.0)
MCH: 24.7 pg — ABNORMAL LOW (ref 26.0–34.0)
MCHC: 30.2 g/dL (ref 30.0–36.0)
MCV: 81.7 fL (ref 78.0–100.0)
Monocytes Absolute: 0.8 10*3/uL (ref 0.1–1.0)
Monocytes Relative: 8 % (ref 3–12)
Neutro Abs: 5.2 10*3/uL (ref 1.7–7.7)
Neutrophils Relative %: 50 % (ref 43–77)
Platelets: 213 10*3/uL (ref 150–400)
RBC: 5.03 MIL/uL (ref 3.87–5.11)
RDW: 15.4 % (ref 11.5–15.5)
WBC: 10.5 10*3/uL (ref 4.0–10.5)

## 2013-06-10 LAB — URINALYSIS, ROUTINE W REFLEX MICROSCOPIC
Bilirubin Urine: NEGATIVE
Glucose, UA: NEGATIVE mg/dL
Hgb urine dipstick: NEGATIVE
Ketones, ur: NEGATIVE mg/dL
Leukocytes, UA: NEGATIVE
Nitrite: NEGATIVE
Protein, ur: NEGATIVE mg/dL
Specific Gravity, Urine: 1.024 (ref 1.005–1.030)
Urobilinogen, UA: 0.2 mg/dL (ref 0.0–1.0)
pH: 5.5 (ref 5.0–8.0)

## 2013-06-10 LAB — LIPASE, BLOOD: Lipase: 29 U/L (ref 11–59)

## 2013-06-10 LAB — I-STAT CG4 LACTIC ACID, ED: Lactic Acid, Venous: 1.4 mmol/L (ref 0.5–2.2)

## 2013-06-10 MED ORDER — CIPROFLOXACIN HCL 500 MG PO TABS: 500.0000 mg | ORAL_TABLET | Freq: Two times a day (BID) | ORAL | Status: DC

## 2013-06-10 MED ORDER — METRONIDAZOLE 500 MG PO TABS: 500.0000 mg | ORAL_TABLET | Freq: Three times a day (TID) | ORAL | Status: DC

## 2013-06-10 MED ORDER — ONDANSETRON HCL 4 MG/2ML IJ SOLN
4.0000 mg | Freq: Once | INTRAMUSCULAR | Status: DC
  Filled 2013-06-10: qty 2

## 2013-06-10 MED ORDER — CIPROFLOXACIN IN D5W 400 MG/200ML IV SOLN
400.0000 mg | Freq: Once | INTRAVENOUS | Status: AC
  Administered 2013-06-10: 400 mg via INTRAVENOUS
  Filled 2013-06-10: qty 200

## 2013-06-10 MED ORDER — IOHEXOL 300 MG/ML  SOLN
50.0000 mL | Freq: Once | INTRAMUSCULAR | Status: AC | PRN
  Administered 2013-06-10: 50 mL via ORAL

## 2013-06-10 MED ORDER — HYDROCODONE-ACETAMINOPHEN 5-325 MG PO TABS: 2.0000 | ORAL_TABLET | ORAL | Status: DC | PRN

## 2013-06-10 MED ORDER — METRONIDAZOLE IN NACL 5-0.79 MG/ML-% IV SOLN
500.0000 mg | Freq: Once | INTRAVENOUS | Status: AC
  Administered 2013-06-10: 500 mg via INTRAVENOUS
  Filled 2013-06-10: qty 100

## 2013-06-10 MED ORDER — MORPHINE SULFATE 4 MG/ML IJ SOLN
4.0000 mg | Freq: Once | INTRAMUSCULAR | Status: AC
  Administered 2013-06-10: 4 mg via INTRAVENOUS
  Filled 2013-06-10: qty 1

## 2013-06-10 MED ORDER — SODIUM CHLORIDE 0.9 % IV SOLN
Freq: Once | INTRAVENOUS | Status: AC
  Administered 2013-06-10: 08:00:00 via INTRAVENOUS

## 2013-06-10 MED ORDER — IOHEXOL 300 MG/ML  SOLN
100.0000 mL | Freq: Once | INTRAMUSCULAR | Status: AC | PRN
  Administered 2013-06-10: 100 mL via INTRAVENOUS

## 2013-06-10 MED ORDER — ONDANSETRON HCL 4 MG PO TABS: 4.0000 mg | ORAL_TABLET | Freq: Four times a day (QID) | ORAL | Status: DC

## 2013-06-10 NOTE — ED Provider Notes (Signed)
CSN: [id number]     Arrival date & time 06/10/13  0618 History   First MD Initiated Contact with Patient 06/10/13 0700     Chief Complaint  Patient presents with  . Abdominal Pain     (Consider location/radiation/quality/duration/timing/severity/associated sxs/prior Treatment) HPI Comments: 3 day history of lower abdominal pain is constant. It does not radiate. nothing makes it better or worse. Pain is similar to previous episodes of diverticulitis. She denies any nausea, vomiting, fever diarrhea. She saw her PCP last week and was started on Augmentin for what she's been on for 8-9 days. She feels it is not improving. The pain is constant and worse with palpation. She denies any chest pain, shortness of breath, urinary or vaginal symptoms. Previous hysterectomy and cholecystectomy. No previous surgeries related to diverticulitis.  The history is provided by the patient.    Past Medical History  Diagnosis Date  . Hypertension   . GERD (gastroesophageal reflux disease)   . Diverticulitis   . Anxiety 2013    has seen Dr. Evelene Croon prior  . Depression 2013    Dr. Evelene Croon prior   Past Surgical History  Procedure Laterality Date  . Cholecystectomy    . Abdominal hysterectomy     Family History  Problem Relation Age of Onset  . Hypertension Other   . Diabetes Other   . Cancer Other    History  Substance Use Topics  . Smoking status: Never Smoker   . Smokeless tobacco: Not on file  . Alcohol Use: Yes     Comment: rare   OB History   Grav Para Term Preterm Abortions TAB SAB Ect Mult Living                 Review of Systems  Constitutional: Negative for fever and activity change.  HENT: Negative for congestion and rhinorrhea.   Respiratory: Negative for cough, chest tightness and shortness of breath.   Cardiovascular: Negative for chest pain.  Gastrointestinal: Positive for abdominal pain. Negative for nausea, vomiting and diarrhea.  Genitourinary: Negative for dysuria, urgency,  vaginal bleeding and vaginal discharge.  Musculoskeletal: Negative for back pain and neck pain.  Skin: Negative for rash.  Neurological: Negative for dizziness, weakness and headaches.  A complete 10 system review of systems was obtained and all systems are negative except as noted in the HPI and PMH.      Allergies  Aspirin  Home Medications   Current Outpatient Rx  Name  Route  Sig  Dispense  Refill  . ALPRAZolam (XANAX) 0.5 MG tablet   Oral   Take 1 tablet (0.5 mg total) by mouth daily as needed for anxiety. For anxiety   30 tablet   0   . amLODipine (NORVASC) 5 MG tablet   Oral   Take 1 tablet (5 mg total) by mouth daily.   30 tablet   2   . amoxicillin-clavulanate (AUGMENTIN) 875-125 MG per tablet   Oral   Take 1 tablet by mouth 2 (two) times daily.   20 tablet   0   . buPROPion (WELLBUTRIN XL) 300 MG 24 hr tablet   Oral   Take 1 tablet (300 mg total) by mouth daily.   30 tablet   2   . cholecalciferol (VITAMIN D) 1000 UNITS tablet   Oral   Take 2,000 Units by mouth daily.         Marland Kitchen esomeprazole (NEXIUM) 40 MG capsule   Oral   Take 40 mg by  mouth daily before breakfast.         . Multiple Vitamin (MULITIVITAMIN WITH MINERALS) TABS   Oral   Take 1 tablet by mouth every morning.          Marland Kitchen Phentermine-Topiramate (QSYMIA) 7.5-46 MG CP24   Oral   Take 1 tablet by mouth daily.         Marland Kitchen triamterene-hydrochlorothiazide (DYAZIDE) 37.5-25 MG per capsule   Oral   Take 1 each (1 capsule total) by mouth every morning.   30 capsule   2   . Vitamin D, Ergocalciferol, (DRISDOL) 50000 UNITS CAPS capsule   Oral   Take 50,000 Units by mouth every 7 (seven) days. Each Monday         . ciprofloxacin (CIPRO) 500 MG tablet   Oral   Take 1 tablet (500 mg total) by mouth every 12 (twelve) hours.   20 tablet   0   . HYDROcodone-acetaminophen (NORCO/VICODIN) 5-325 MG per tablet   Oral   Take 2 tablets by mouth every 4 (four) hours as needed.   10  tablet   0   . metroNIDAZOLE (FLAGYL) 500 MG tablet   Oral   Take 1 tablet (500 mg total) by mouth 3 (three) times daily.   30 tablet   0   . ondansetron (ZOFRAN) 4 MG tablet   Oral   Take 1 tablet (4 mg total) by mouth every 6 (six) hours.   12 tablet   0    BP 128/81  Pulse 85  Temp(Src) 97.9 F (36.6 C) (Oral)  Resp 16  Ht 5\' 7"  (1.702 m)  Wt 245 lb (111.131 kg)  BMI 38.36 kg/m2  SpO2 99% Physical Exam  Constitutional: She is oriented to person, place, and time. She appears well-developed and well-nourished. No distress.  HENT:  Head: Normocephalic and atraumatic.  Mouth/Throat: Oropharynx is clear and moist. No oropharyngeal exudate.  Eyes: Conjunctivae and EOM are normal. Pupils are equal, round, and reactive to light.  Neck: Normal range of motion. Neck supple.  Cardiovascular: Normal rate, regular rhythm and normal heart sounds.   Pulmonary/Chest: Effort normal and breath sounds normal. No respiratory distress.  Abdominal: Soft. Bowel sounds are normal. There is tenderness. There is no rebound and no guarding.  Diffuse lower abdominal pain worse in the periumbilical area, no guarding or rebound  Musculoskeletal: Normal range of motion. She exhibits no edema and no tenderness.  No CVAT  Neurological: She is alert and oriented to person, place, and time. No cranial nerve deficit. Coordination normal.  Skin: Skin is warm.    ED Course  Procedures (including critical care time) Labs Review Labs Reviewed  CBC WITH DIFFERENTIAL - Abnormal; Notable for the following:    MCH 24.7 (*)    All other components within normal limits  COMPREHENSIVE METABOLIC PANEL - Abnormal; Notable for the following:    Sodium 136 (*)    Glucose, Bld 108 (*)    Total Bilirubin <0.2 (*)    GFR calc non Af Amer 59 (*)    GFR calc Af Amer 69 (*)    All other components within normal limits  URINALYSIS, ROUTINE W REFLEX MICROSCOPIC  LIPASE, BLOOD  I-STAT CG4 LACTIC ACID, ED    Imaging Review Ct Abdomen Pelvis W Contrast  06/10/2013   CLINICAL DATA:  Lower abdominal pain, history of diverticulitis  EXAM: CT ABDOMEN AND PELVIS WITH CONTRAST  TECHNIQUE: Multidetector CT imaging of the abdomen and pelvis was performed  using the standard protocol following bolus administration of intravenous contrast.  CONTRAST:  100mL OMNIPAQUE IOHEXOL 300 MG/ML  SOLN  COMPARISON:  05/01/2012  FINDINGS: Lung bases are free of acute infiltrate or sizable effusion. A moderate sized hiatal hernia is seen.  The gallbladder is been surgically removed. The liver, spleen, adrenal glands and pancreas are within normal limits. The kidneys are well visualized bilaterally and demonstrate a normal enhancement pattern. A tiny nonobstructing right renal stone is seen. The ureters are within normal limits.  The appendix is within normal limits. Scattered diverticular changes again identified throughout the colon. Changes of diverticulitis are noted in the descending colon without evidence of perforation or abscess formation. The bladder is well distended. No pelvic mass lesion is seen. No significant lymphadenopathy is noted.  IMPRESSION: Findings consistent with diverticulitis in the descending colon.  Nonobstructing right renal stone.   Electronically Signed   By: Alcide CleverMark  Lukens M.D.   On: 06/10/2013 09:00     EKG Interpretation None      MDM   Final diagnoses:  Diverticulitis   Constant abdominal pain without associated symptoms. Typical of Previous diverticulitis. Abdomen soft without peritoneal signs.  No leukocytosis. UA negative. CT shows uncomplicated descending colon diverticulitis. Pain is controlled in the ED, no vomiting. Patient reports she's been on Augmentin for about the past 8 or 9 days and has had no change in her symptoms. Will change antibiotics to cipro and flagyl.  Tolerate by mouth in ED no vomiting. Pain is controlled. Patient will be discharged on Cipro and Flagyl and given pain  and nausea medications. Followup with PCP this week. Return precautions discussed.  BP 128/81  Pulse 85  Temp(Src) 97.9 F (36.6 C) (Oral)  Resp 16  Ht 5\' 7"  (1.702 m)  Wt 245 lb (111.131 kg)  BMI 38.36 kg/m2  SpO2 99%    Glynn OctaveStephen Velera [place], MD 06/10/13 1411

## 2013-06-10 NOTE — ED Notes (Signed)
Pt given ICE WATER and encouraged to drink fluid for fluid challenge.

## 2013-06-10 NOTE — Discharge Instructions (Signed)
Diverticulitis °A diverticulum is a small pouch or sac on the colon. Diverticulosis is the presence of these diverticula on the colon. Diverticulitis is the irritation (inflammation) or infection of diverticula. °CAUSES  °The colon and its diverticula contain bacteria. If food particles block the tiny opening to a diverticulum, the bacteria inside can grow and cause an increase in pressure. This leads to infection and inflammation and is called diverticulitis. °SYMPTOMS  °· Abdominal pain and tenderness. Usually, the pain is located on the left side of your abdomen. However, it could be located elsewhere. °· Fever. °· Bloating. °· Feeling sick to your stomach (nausea). °· Throwing up (vomiting). °· Abnormal stools. °DIAGNOSIS  °Your caregiver will take a history and perform a physical exam. Since many things can cause abdominal pain, other tests may be necessary. Tests may include: °· Blood tests. °· Urine tests. °· X-ray of the abdomen. °· CT scan of the abdomen. °Sometimes, surgery is needed to determine if diverticulitis or other conditions are causing your symptoms. °TREATMENT  °Most of the time, you can be treated without surgery. Treatment includes: °· Resting the bowels by only having liquids for a few days. As you improve, you will need to eat a low-fiber diet. °· Intravenous (IV) fluids if you are losing body fluids (dehydrated). °· Antibiotic medicines that treat infections may be given. °· Pain and nausea medicine, if needed. °· Surgery if the inflamed diverticulum has burst. °HOME CARE INSTRUCTIONS  °· Try a clear liquid diet (broth, tea, or water for as long as directed by your caregiver). You may then gradually begin a low-fiber diet as tolerated.  °A low-fiber diet is a diet with less than 10 grams of fiber. Choose the foods below to reduce fiber in the diet: °· White breads, cereals, rice, and pasta. °· Cooked fruits and vegetables or soft fresh fruits and vegetables without the skin. °· Ground or  well-cooked tender beef, ham, veal, lamb, pork, or poultry. °· Eggs and seafood. °· After your diverticulitis symptoms have improved, your caregiver may put you on a high-fiber diet. A high-fiber diet includes 14 grams of fiber for every 1000 calories consumed. For a standard 2000 calorie diet, you would need 28 grams of fiber. Follow these diet guidelines to help you increase the fiber in your diet. It is important to slowly increase the amount fiber in your diet to avoid gas, constipation, and bloating. °· Choose whole-grain breads, cereals, pasta, and brown rice. °· Choose fresh fruits and vegetables with the skin on. Do not overcook vegetables because the more vegetables are cooked, the more fiber is lost. °· Choose more nuts, seeds, legumes, dried peas, beans, and lentils. °· Look for food products that have greater than 3 grams of fiber per serving on the Nutrition Facts label. °· Take all medicine as directed by your caregiver. °· If your caregiver has given you a follow-up appointment, it is very important that you go. Not going could result in lasting (chronic) or permanent injury, pain, and disability. If there is any problem keeping the appointment, call to reschedule. °SEEK MEDICAL CARE IF:  °· Your pain does not improve. °· You have a hard time advancing your diet beyond clear liquids. °· Your bowel movements do not return to normal. °SEEK IMMEDIATE MEDICAL CARE IF:  °· Your pain becomes worse. °· You have an oral temperature above 102° F (38.9° C), not controlled by medicine. °· You have repeated vomiting. °· You have bloody or black, tarry stools. °·   Symptoms that brought you to your caregiver become worse or are not getting better. °MAKE SURE YOU:  °· Understand these instructions. °· Will watch your condition. °· Will get help right away if you are not doing well or get worse. °Document Released: 12/03/2004 Document Revised: 05/18/2011 Document Reviewed: 03/31/2010 °[employer]® Patient Information  ©2014 [employer]. ° °

## 2013-06-10 NOTE — ED Notes (Signed)
Pt c/o low mid abd pain x 3 days. Denies n/v/d, denies urinary s/s.

## 2013-06-10 NOTE — ED Notes (Addendum)
Pt able to ambulate independently and stable at present time. Pt understand risks/benefits of driving.

## 2013-06-10 NOTE — ED Notes (Signed)
PT tolerated PO Challenge well.

## 2013-06-10 NOTE — ED Notes (Signed)
MD Rancour at bedside 

## 2013-06-29 ENCOUNTER — Ambulatory Visit: Payer: No Typology Code available for payment source | Admitting: Medical

## 2013-07-28 ENCOUNTER — Encounter: Payer: Self-pay | Admitting: Medical

## 2013-07-28 ENCOUNTER — Ambulatory Visit (INDEPENDENT_AMBULATORY_CARE_PROVIDER_SITE_OTHER): Payer: 59 | Admitting: Medical

## 2013-07-28 VITALS — BP 122/90 | HR 78 | Temp 98.0°F | Resp 14 | Ht 65.5 in | Wt 230.0 lb

## 2013-07-28 DIAGNOSIS — R7301 Impaired fasting glucose: Secondary | ICD-10-CM

## 2013-07-28 DIAGNOSIS — Z0000 Encounter for general adult medical examination without abnormal findings: Secondary | ICD-10-CM

## 2013-07-28 DIAGNOSIS — K219 Gastro-esophageal reflux disease without esophagitis: Secondary | ICD-10-CM

## 2013-07-28 DIAGNOSIS — I10 Essential (primary) hypertension: Secondary | ICD-10-CM

## 2013-07-28 DIAGNOSIS — F341 Dysthymic disorder: Secondary | ICD-10-CM

## 2013-07-28 DIAGNOSIS — E669 Obesity, unspecified: Secondary | ICD-10-CM

## 2013-07-28 DIAGNOSIS — F418 Other specified anxiety disorders: Secondary | ICD-10-CM

## 2013-07-28 DIAGNOSIS — E559 Vitamin D deficiency, unspecified: Secondary | ICD-10-CM

## 2013-07-28 LAB — POCT URINALYSIS DIPSTICK
Bilirubin, UA: NEGATIVE
Blood, UA: NEGATIVE
Glucose, UA: NEGATIVE
Ketones, UA: NEGATIVE
Leukocytes, UA: NEGATIVE
Nitrite, UA: NEGATIVE
Spec Grav, UA: 1.015
Urobilinogen, UA: NEGATIVE
pH, UA: 6

## 2013-07-28 MED ORDER — BUPROPION HCL ER (XL) 300 MG PO TB24: 300.0000 mg | ORAL_TABLET | Freq: Every day | ORAL | Status: DC

## 2013-07-28 MED ORDER — ALPRAZOLAM 0.5 MG PO TABS: 0.5000 mg | ORAL_TABLET | Freq: Every day | ORAL | Status: DC | PRN

## 2013-07-28 MED ORDER — PHENTERMINE-TOPIRAMATE ER 7.5-46 MG PO CP24: 1.0000 | ORAL_CAPSULE | Freq: Every day | ORAL | Status: DC

## 2013-07-28 MED ORDER — AMLODIPINE BESYLATE 5 MG PO TABS: 5.0000 mg | ORAL_TABLET | Freq: Every day | ORAL | Status: DC

## 2013-07-28 MED ORDER — TRIAMTERENE-HCTZ 37.5-25 MG PO TABS: 1.0000 | ORAL_TABLET | Freq: Every day | ORAL | Status: DC

## 2013-07-28 MED ORDER — OMEPRAZOLE-SODIUM BICARBONATE 40-1100 MG PO CAPS: 1.0000 | ORAL_CAPSULE | Freq: Every day | ORAL | Status: DC

## 2013-07-28 NOTE — Progress Notes (Signed)
Subjective:   HPI  Yolanda Brown is a 53 y.o. female who presents for a complete physical.  Kind of in a rush today, sister is in ICU on ventilator, had stroke recently, prognosis is poor.  She is in [place].     Preventative care: Last ophthalmology visit:YES DR. DOLAN Last dental visit:YES [facility] Last colonoscopy:2011 Last mammogram:2012 Last gynecological exam:2009 Last EKG:YES Last labs: recently  Prior vaccinations: TD or Tdap:2012 Influenza:2015 Pneumococcal:N/A Shingles/Zostavax:N/A  Advanced directive:N/A Health care power of attorney:N/A Living will:N/A  Concerns: HTN - checks BP, usually normal, up to day due to stress.  Compliant with medication  Obesity - been on this 1 month, would like me to take over this as [facility] and just established there.   Lost from 245 down to 231lb. Now.  exercise 3 days at [facility] gym at the hospital and uses trainer.  Has made changes in diet, low carb diet, higher protein. Nurse at [employer].  Depression/anxiety - compliant with medication which she feels is working fine.   Hx/o hysterectomy due to fibroids.   Last mammogram few years ago.   No prior abnormal paps.   Reviewed their medical, surgical, family, social, medication, and allergy history and updated chart as appropriate.  Past Medical History  Diagnosis Date  . Hypertension   . GERD (gastroesophageal reflux disease)   . Diverticulitis 06/2013    3 total episodes  . Vitamin D deficiency   . Allergy   . Anxiety 2013    has seen Dr. Evelene Croon prior  . Depression 2013    Dr. Evelene Croon prior    Past Surgical History  Procedure Laterality Date  . Cholecystectomy    . Colonoscopy  2011    [place], [place]; polyp and diverticulosis  . Wisdom tooth extraction    . Abdominal hysterectomy      fibroids, still has ovaries    History   Social History  . Marital Status: Married    Spouse Name: N/A    Number of  Children: N/A  . Years of Education: N/A   Occupational History  . Not on file.   Social History Main Topics  . Smoking status: Never Smoker   . Smokeless tobacco: Not on file  . Alcohol Use: 1.2 oz/week    2 Glasses of wine per week     Comment: rare  . Drug Use: No  . Sexual Activity: Not on file   Other Topics Concern  . 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 Hypertension Other   . Diabetes Other   . Diabetes Mother   . Heart disease Mother   . Cancer Father     prostate, mets  . Diabetes Father   . Hypertension Sister   . Diabetes Sister   . Stroke Sister   . Hypertension Brother   . Obesity Brother   . Cancer Paternal Uncle     13 uncles all died with cancer, various types  . Hypertension Brother   . Other Brother     died of sepsis s/p perforated bowel  . Obesity Brother   . Heart disease Sister   . Hypertension Sister   . Diabetes Sister     Current outpatient prescriptions:ALPRAZolam (XANAX) 0.5 MG tablet, Take 1 tablet (0.5 mg  total) by mouth daily as needed for anxiety. For anxiety, Disp: 30 tablet, Rfl: 1;  amLODipine (NORVASC) 5 MG tablet, Take 1 tablet (5 mg total) by mouth daily., Disp: 90 tablet, Rfl: 3;  buPROPion (WELLBUTRIN XL) 300 MG 24 hr tablet, Take 1 tablet (300 mg total) by mouth daily., Disp: 90 tablet, Rfl: 1 cholecalciferol (VITAMIN D) 1000 UNITS tablet, Take 2,000 Units by mouth daily., Disp: , Rfl: ;  esomeprazole (NEXIUM) 40 MG capsule, Take 40 mg by mouth daily before breakfast., Disp: , Rfl: ;  Multiple Vitamin (MULITIVITAMIN WITH MINERALS) TABS, Take 1 tablet by mouth every morning. , Disp: , Rfl: ;  Phentermine-Topiramate (QSYMIA) 7.5-46 MG CP24, Take 1 tablet by mouth daily., Disp: 30 capsule, Rfl: 1 triamterene-hydrochlorothiazide (MAXZIDE-25) 37.5-25 MG per tablet, Take 1 tablet by mouth daily., Disp: 90  tablet, Rfl: 3;  omeprazole-sodium bicarbonate (ZEGERID) 40-1100 MG per capsule, Take 1 capsule by mouth daily before breakfast., Disp: 90 capsule, Rfl: 3  Allergies  Allergen Reactions  . Aspirin Hives       Review of Systems Constitutional: -fever, -chills, -sweats, -unexpected weight change, -decreased appetite, -fatigue Allergy: -sneezing, -itching, -congestion Dermatology: -changing moles, --rash, -lumps ENT: -runny nose, -ear pain, -sore throat, -hoarseness, -sinus pain, -teeth pain, - ringing in ears, -hearing loss, -nosebleeds Cardiology: -chest pain, -palpitations, -swelling, -difficulty breathing when lying flat, -waking up short of breath Respiratory: -cough, -shortness of breath, -difficulty breathing with exercise or exertion, -wheezing, -coughing up blood Gastroenterology: -abdominal pain, -nausea, -vomiting, -diarrhea, -constipation, -blood in stool, -changes in bowel movement, -difficulty swallowing or eating Hematology: -bleeding, -bruising  Musculoskeletal: -joint aches, -muscle aches, -joint swelling, -back pain, -neck pain, -cramping, -changes in gait Ophthalmology: denies vision changes, eye redness, itching, discharge Urology: -burning with urination, -difficulty urinating, -blood in urine, -urinary frequency, -urgency, -incontinence Neurology: -headache, -weakness, -tingling, -numbness, -memory loss, -falls, -dizziness Psychology: +depressed mood, -agitation, -sleep problems     Objective:   Physical Exam  BP 122/90  Pulse 78  Temp(Src) 98 F (36.7 C) (Oral)  Resp 14  Ht 5' 5.5" (1.664 m)  Wt 230 lb (104.327 kg)  BMI 37.68 kg/m2  General appearance: alert, no distress, WD/WN, obese AA female Skin: flat brown birth marks upper middle and upper right back, tattoos low back and left posterior shoulder, scattered macules throughout, no worrisome lesions HEENT: normocephalic, conjunctiva/corneas normal, sclerae anicteric, PERRLA, EOMi, nares patent, no  discharge or erythema, pharynx normal Oral cavity: MMM, tongue normal, teeth in good repair Neck: supple, no lymphadenopathy, no thyromegaly, no masses, normal ROM, no bruits Chest: non tender, normal shape and expansion Heart: RRR, normal S1, S2, no murmurs Lungs: CTA bilaterally, no wheezes, rhonchi, or rales Abdomen: +bs, soft, non tender, non distended, no masses, no hepatomegaly, no splenomegaly, no bruits Back: non tender, normal ROM, no scoliosis Musculoskeletal: upper extremities non tender, no obvious deformity, normal ROM throughout, lower extremities non tender, no obvious deformity, normal ROM throughout Extremities: no edema, no cyanosis, no clubbing Pulses: 2+ symmetric, upper and lower extremities, normal cap refill Neurological: alert, oriented x 3, CN2-12 intact, strength normal upper extremities and lower extremities, sensation normal throughout, DTRs 2+ throughout, no cerebellar signs, gait normal Psychiatric: normal affect, behavior normal, pleasant  Breast/gyn/rectal -deferred today, will return soon   Assessment and Plan :    Encounter Diagnoses  Name Primary?  . Routine general medical examination at a health care facility Yes  . Essential hypertension, benign   . GERD (gastroesophageal reflux disease)   .  Depression with anxiety   . Impaired fasting blood sugar   . Unspecified vitamin D deficiency   . Obesity, unspecified       Physical exam - discussed healthy lifestyle, diet, exercise, preventative care, vaccinations, and addressed their concerns.  Handout given.   Refilled all medications today HTN - controlled on current medication GERD - nexium not working as good as Zegerid.  Failed Nexium.  Dexilant too expensive but worked fine.  Depression with anxiety - c/t current medications. Impaired fasting glucose on recent labs - additional labs ordered Vit D deficiency- taking 1000 IU OTC VIt D.  repeat lab. Obesity - work on diet, exercise and c/t Qsymia,  recheck 54mo. Follow-up - return for fasting labs and breast/pap/pelvic

## 2013-08-15 ENCOUNTER — Ambulatory Visit (INDEPENDENT_AMBULATORY_CARE_PROVIDER_SITE_OTHER): Payer: 59 | Admitting: Medical

## 2013-08-15 ENCOUNTER — Encounter: Payer: Self-pay | Admitting: Medical

## 2013-08-15 VITALS — BP 112/80 | HR 77 | Temp 98.0°F | Resp 16 | Wt 238.0 lb

## 2013-08-15 DIAGNOSIS — E559 Vitamin D deficiency, unspecified: Secondary | ICD-10-CM

## 2013-08-15 DIAGNOSIS — I10 Essential (primary) hypertension: Secondary | ICD-10-CM

## 2013-08-15 DIAGNOSIS — Z124 Encounter for screening for malignant neoplasm of cervix: Secondary | ICD-10-CM

## 2013-08-15 DIAGNOSIS — R7301 Impaired fasting glucose: Secondary | ICD-10-CM

## 2013-08-15 DIAGNOSIS — Z0000 Encounter for general adult medical examination without abnormal findings: Secondary | ICD-10-CM

## 2013-08-15 DIAGNOSIS — Z1239 Encounter for other screening for malignant neoplasm of breast: Secondary | ICD-10-CM

## 2013-08-15 LAB — LIPID PANEL
Cholesterol: 165 mg/dL (ref 0–200)
HDL: 42 mg/dL (ref >39)
LDL Cholesterol: 92 mg/dL (ref 0–99)
Total CHOL/HDL Ratio: 3.9 Ratio
Triglycerides: 156 mg/dL — ABNORMAL HIGH (ref <150)
VLDL: 31 mg/dL (ref 0–40)

## 2013-08-15 LAB — HEMOGLOBIN A1C
Hgb A1c MFr Bld: 6.2 % — ABNORMAL HIGH (ref <5.7)
Mean Plasma Glucose: 131 mg/dL — ABNORMAL HIGH (ref <117)

## 2013-08-15 MED ORDER — PHENTERMINE-TOPIRAMATE ER 11.25-69 MG PO CP24: 1.0000 | ORAL_CAPSULE | ORAL | Status: DC

## 2013-08-15 NOTE — Progress Notes (Signed)
   Subjective:   ALICIA REINHOLD is a 53 y.o. female presenting on 08/15/2013 with Gynecologic Exam  Here for followup from her recent physical.  She had to leave quickly that day was unable to do labs and female exam that day.  Thus here for breast exam, pelvic exam, fasting labs.  S/p hysterectomy due to fibroids.  Has breast biopsy last mammogram within last few years, right sided calcifications, no cancer.  Is due for mammogram now.  No vaginal or urinary c/o.  She notes no improvement with weight loss or appetite suppression on regular dose of Qsymia she has been on for several weeks.   Her sister did pass away of stroke after our last visit.   Was on ventilator for a few weeks, but never recovered.   No other complaint.  Review of Systems ROS as in subjective      Objective:     Filed Vitals:   08/15/13 0924  BP: 112/80  Pulse: 77  Temp: 98 F (36.7 C)  Resp: 16    General appearance: alert, no distress, WD/WN Breast: nontender, no masses or lumps, no skin changes, no nipple discharge or inversion, no axillary lymphadenopathy Gyn: Normal external genitalia without lesions, vagina with normal mucosa, s/p hysterectomy, no abnormal vaginal discharge.  Adnexa not enlarged, nontender, no masses.  Exam chaperoned by nurse. Rectal: anus normal tone, no lesions, occult negative stool        Assessment: Encounter Diagnoses  Name Primary?  Marland Kitchen Unspecified vitamin D deficiency Yes  . Routine general medical examination at a health care facility   . Essential hypertension, benign   . Impaired fasting blood sugar   . Screening for cervical cancer   . Screening for breast cancer      Plan: Will set up for repeat mammogram, discussed routine self breast exams.  Routine f/u here for physical.  Expressed sympathy for her recent death of her sister due Afib and stroke.  Fasting labs today.    Obesity - no improvement or change in appetite with regular dose Qsymia, increase  to higher dose.    Samuel was seen today for gynecologic exam.  Diagnoses and associated orders for this visit:  Unspecified vitamin D deficiency - Vitamin D, 25-hydroxy  Routine general medical examination at a health care facility - Lipid panel  Essential hypertension, benign - Lipid panel - Microalbumin / creatinine urine ratio  Impaired fasting blood sugar - Lipid panel - Hemoglobin A1c - Microalbumin / creatinine urine ratio  Screening for cervical cancer - Cancel: Cytology - PAP  Screening for breast cancer  Other Orders - Phentermine-Topiramate (QSYMIA) 11.25-69 MG CP24; Take 1 capsule by mouth every morning.    Return pending labs.

## 2013-08-16 LAB — MICROALBUMIN / CREATININE URINE RATIO
Creatinine, Urine: 285.3 mg/dL
Microalb Creat Ratio: 16.7 mg/g (ref 0.0–30.0)
Microalb, Ur: 4.77 mg/dL — ABNORMAL HIGH (ref 0.00–1.89)

## 2013-08-16 LAB — VITAMIN D 25 HYDROXY (VIT D DEFICIENCY, FRACTURES): Vit D, 25-Hydroxy: 48 ng/mL (ref 30–89)

## 2013-09-06 ENCOUNTER — Ambulatory Visit: Payer: Self-pay | Admitting: Medical

## 2013-09-06 LAB — HM MAMMOGRAPHY: HM Mammogram: NEGATIVE

## 2013-09-11 ENCOUNTER — Encounter: Payer: Self-pay | Admitting: Internal Medicine

## 2013-10-05 ENCOUNTER — Other Ambulatory Visit: Payer: Self-pay | Admitting: Medical

## 2013-10-05 NOTE — Telephone Encounter (Signed)
Ok to RF? 

## 2013-10-06 NOTE — Telephone Encounter (Signed)
I called out Xanax to the patients pharmacy per David Tysinger PAC. CLS 

## 2013-10-17 ENCOUNTER — Telehealth: Payer: Self-pay | Admitting: Medical

## 2013-10-17 NOTE — Telephone Encounter (Signed)
Lets either renew current dose, or have her wait until appt if thinking it needs to be increased

## 2013-10-17 NOTE — Telephone Encounter (Signed)
LMOM FOR THE PATIENT TO CALL BACK

## 2013-10-18 NOTE — Telephone Encounter (Signed)
Patient has an appointment tomorrow so she will wait then for her medication. CLS

## 2013-10-23 ENCOUNTER — Encounter: Payer: 59 | Admitting: Medical

## 2013-10-25 ENCOUNTER — Encounter: Payer: Self-pay | Admitting: Medical

## 2013-11-09 ENCOUNTER — Other Ambulatory Visit: Payer: Self-pay | Admitting: Medical

## 2013-11-09 DIAGNOSIS — F411 Generalized anxiety disorder: Secondary | ICD-10-CM

## 2013-11-09 MED ORDER — ALPRAZOLAM 0.5 MG PO TABS: 0.5000 mg | ORAL_TABLET | Freq: Every evening | ORAL | Status: DC | PRN

## 2013-11-09 NOTE — Telephone Encounter (Signed)
Called Xanax 0.5mg  #30 1 po qhs prn anxiety, per Vincenza Hews.

## 2013-11-09 NOTE — Telephone Encounter (Signed)
Ok to RF? 

## 2013-11-09 NOTE — Telephone Encounter (Signed)
Can all out xanax.  See order info.   She is due for f/u appt too, so please reschedule.

## 2013-12-07 ENCOUNTER — Ambulatory Visit (INDEPENDENT_AMBULATORY_CARE_PROVIDER_SITE_OTHER): Payer: 59 | Admitting: Medical

## 2013-12-07 ENCOUNTER — Encounter: Payer: Self-pay | Admitting: Medical

## 2013-12-07 DIAGNOSIS — Z79899 Other long term (current) drug therapy: Secondary | ICD-10-CM

## 2013-12-07 MED ORDER — PHENTERMINE-TOPIRAMATE ER 11.25-69 MG PO CP24: 1.0000 | ORAL_CAPSULE | ORAL | Status: DC

## 2013-12-07 NOTE — Progress Notes (Signed)
Subjective: Here for f/u on obesity and medication.  She continues to work on weight loss efforts through diet and exercise.  Drinks 7-8 large glasses of water daily.   Diet - morning eats 2 pieces of [place] bacon, hard boiiled egg.   Chicken and salad for dinner.  Or uses fish instead of checking, eating a lot of green.   No soft drinks or tea.  Avoiding sweets, not a big sweets person in general.   Since last visit has had some trouble staying on track given other things going on in her life, and she ended up going to a hormone clinic specialty clinic started her on hormone replacement pellets. She had run out of Qsymia for a period time but wants to start back on this. She did see improvement on the medication without side effects.  No other complaint today  Past Medical History  Diagnosis Date  . Hypertension   . GERD (gastroesophageal reflux disease)   . Diverticulitis 06/2013    3 total episodes  . Vitamin D deficiency   . Allergy   . Anxiety 2013    has seen Dr. Evelene CroonKaur prior  . Depression 2013    Dr. Evelene CroonKaur prior   ROS as in subjective   Objective: Filed Vitals:   12/07/13 1450  BP: 122/82  Pulse: 68  Temp: 98 F (36.7 C)  Resp: 16   General appearence: alert, no distress, WD/WN Heart: RRR, normal S1, S2, no murmurs Lungs: CTA bilaterally, no wheezes, rhonchi, or rales Pulses: 2+ symmetric, upper and lower extremities, normal cap refill    Assessment: Encounter Diagnoses  Name Primary?  . Obesity, morbid Yes  . High risk medication use      Plan: Continue back on Qsymia to help with weight loss efforts, spent most of the time today discussing diet measures, strategies to lose weight, continue routine exercise, drink plenty of water daily, recheck in 1-2 months.

## 2014-01-05 ENCOUNTER — Other Ambulatory Visit: Payer: Self-pay | Admitting: Medical

## 2014-01-05 NOTE — Telephone Encounter (Signed)
I called Xanax per [facility]ysteravid [facility]inger [facility]

## 2014-01-05 NOTE — Telephone Encounter (Signed)
Call out xanax 

## 2014-01-05 NOTE — Telephone Encounter (Signed)
Is this okay to refill? 

## 2014-01-31 ENCOUNTER — Ambulatory Visit: Payer: Self-pay

## 2014-02-06 ENCOUNTER — Ambulatory Visit: Payer: Self-pay

## 2014-02-22 ENCOUNTER — Encounter: Payer: Self-pay | Admitting: Medical

## 2014-02-22 ENCOUNTER — Ambulatory Visit (INDEPENDENT_AMBULATORY_CARE_PROVIDER_SITE_OTHER): Payer: 59 | Admitting: Medical

## 2014-02-22 VITALS — BP 126/88 | HR 88 | Temp 98.2°F | Resp 16 | Wt 236.0 lb

## 2014-02-22 DIAGNOSIS — K5732 Diverticulitis of large intestine without perforation or abscess without bleeding: Secondary | ICD-10-CM

## 2014-02-22 DIAGNOSIS — R1032 Left lower quadrant pain: Secondary | ICD-10-CM

## 2014-02-22 DIAGNOSIS — R630 Anorexia: Secondary | ICD-10-CM

## 2014-02-22 LAB — POCT URINALYSIS DIPSTICK
Bilirubin, UA: NEGATIVE
Blood, UA: NEGATIVE
Glucose, UA: NEGATIVE
Ketones, UA: NEGATIVE
Leukocytes, UA: NEGATIVE
Nitrite, UA: NEGATIVE
Protein, UA: NEGATIVE
Spec Grav, UA: 1.02
Urobilinogen, UA: 2
pH, UA: 6.5

## 2014-02-22 MED ORDER — CIPROFLOXACIN HCL 500 MG PO TABS: 500.0000 mg | ORAL_TABLET | Freq: Two times a day (BID) | ORAL | Status: DC

## 2014-02-22 MED ORDER — METRONIDAZOLE 500 MG PO TABS: 500.0000 mg | ORAL_TABLET | Freq: Three times a day (TID) | ORAL | Status: DC

## 2014-02-22 MED ORDER — HYDROCODONE-ACETAMINOPHEN 7.5-325 MG PO TABS: 1.0000 | ORAL_TABLET | Freq: Four times a day (QID) | ORAL | Status: DC | PRN

## 2014-02-22 NOTE — Progress Notes (Signed)
Subjective: Here for several days of not feeling well.  She notes pain and cramping down in lower abdomen.   Has hx/o diverticulitis 06/2013, and thinks this could be a flare up.  Does get some improvement with passing gas or BM, but then cramping starts again.    Denies fever, no diarrhea or blood in stool.   Last loose stool was 5 days ago.   Stool is more hard now, but not pebbles.   Last BM was Tuesday 2 days ago.  No vomiting.  No urinary burning, no blood in urine, no odor or cloudiness in urine.  Last colonoscopy 5 years ago.  No back pain.  Using nothing for symptoms.   No appetite in last few days.  No URI symptoms.  No other aggravating or relieving factors. No other complaint.  Past Medical History  Diagnosis Date  . Hypertension   . GERD (gastroesophageal reflux disease)   . Diverticulitis 06/2013    3 total episodes  . Vitamin D deficiency   . Allergy   . Anxiety 2013    has seen Dr. Evelene CroonKaur prior  . Depression 2013    Dr. Evelene CroonKaur prior    Objective: BP 126/88 mmHg  Pulse 88  Temp(Src) 98.2 F (36.8 C) (Oral)  Resp 16  Wt 236 lb (107.049 kg)  Gen: wd, wn, seems to be in pain, holding lower abdomen Skin: somewhat clammy, no rash Oral cavity: MMM, no lesions Neck: supple, no lymphadenopathy, no thyromegaly, no masses Heart: RRR, normal S1, S2, no murmurs Lungs: CTA bilaterally, no wheezes, rhonchi, or rales Abdomen: +bs, soft, tender suprapubic and LLQ, otherwise non tender, non distended, no masses, no hepatomegaly, no splenomegaly Pulses: 2+ symmetric, upper and lower extremities, normal cap refill Ext: no edema Back: nontender   Assessment: Encounter Diagnoses  Name Primary?  . Diverticulitis of colon Yes  . Left lower quadrant pain   . Anorexia     Plan: Reviewed CT from 06/2013.   Discussed current symptoms suggestive of diverticulitis.   Advised 24-48 hours of clear fluid intake only, begin Cipro and Flagyl, begin Hydrocodone prn, rest, and if new or worsening  symptoms in the next few days, call or go to the ED.   If gradually improving, may start BRAT diet in a few days.  Recheck next week, sooner prn.  Advised she have f/u with GI as well since this makes the 4th similar episode since last colonoscopy.

## 2014-02-22 NOTE — Patient Instructions (Signed)

## 2014-03-19 ENCOUNTER — Other Ambulatory Visit: Payer: Self-pay | Admitting: Medical

## 2014-03-19 ENCOUNTER — Telehealth: Payer: Self-pay | Admitting: Family Medicine

## 2014-03-19 NOTE — Telephone Encounter (Signed)
Call and see how she is doing from where I saw her last visit?  If she is still taking weight loss medication, due for f/u.   Lets make recheck visit on both issues and anxiety medications soon.  Call out Xanax for 30 day supply.

## 2014-03-19 NOTE — Telephone Encounter (Signed)
    Call and see how she is doing from where I saw her last visit? If she is still taking weight loss medication, due for f/u. Lets make recheck visit on both issues and anxiety medications soon. Call out Xanax for 30 day supply.

## 2014-03-19 NOTE — Telephone Encounter (Signed)
Is this okay to refill? 

## 2014-03-20 NOTE — Telephone Encounter (Signed)
Patient is no longer taking the weight loss medication. She felt like it was not helping. Patient will make an appointment to follow up on her anxiety medication soon. She will call back to set that up

## 2014-04-26 ENCOUNTER — Ambulatory Visit (INDEPENDENT_AMBULATORY_CARE_PROVIDER_SITE_OTHER): Payer: 59 | Admitting: Medical

## 2014-04-26 ENCOUNTER — Encounter: Payer: Self-pay | Admitting: Medical

## 2014-04-26 ENCOUNTER — Telehealth: Payer: Self-pay | Admitting: Medical

## 2014-04-26 VITALS — BP 120/82 | HR 86 | Temp 98.1°F | Resp 15 | Wt 242.0 lb

## 2014-04-26 DIAGNOSIS — N951 Menopausal and female climacteric states: Secondary | ICD-10-CM

## 2014-04-26 DIAGNOSIS — Z780 Asymptomatic menopausal state: Secondary | ICD-10-CM

## 2014-04-26 DIAGNOSIS — F411 Generalized anxiety disorder: Secondary | ICD-10-CM

## 2014-04-26 DIAGNOSIS — E669 Obesity, unspecified: Secondary | ICD-10-CM

## 2014-04-26 DIAGNOSIS — R232 Flushing: Secondary | ICD-10-CM

## 2014-04-26 LAB — POCT GLYCOSYLATED HEMOGLOBIN (HGB A1C): Hemoglobin A1C: 6.4

## 2014-04-26 MED ORDER — PHENTERMINE-TOPIRAMATE ER 7.5-46 MG PO CP24: 1.0000 | ORAL_CAPSULE | ORAL | Status: DC

## 2014-04-26 MED ORDER — ALPRAZOLAM 0.5 MG PO TABS: 0.5000 mg | ORAL_TABLET | Freq: Every evening | ORAL | Status: DC | PRN

## 2014-04-26 MED ORDER — PHENTERMINE-TOPIRAMATE ER 11.25-69 MG PO CP24: 1.0000 | ORAL_CAPSULE | ORAL | Status: DC

## 2014-04-26 NOTE — Telephone Encounter (Signed)
Call out Qsymia, Xanax.   Regarding menopausal symptoms, after reviewing options, lets just have her try Clonidine QHS for hot flashes, c/t Wellbutrin, work on healthy diet, exercise, weight loss, and if not improving in 46mo, then we can try Estradiol or similar.  See if agreeable?

## 2014-04-26 NOTE — Progress Notes (Signed)
Subjective: Here for several questions.    She would like to go on hormones for estrogen.   Symptoms include hot flashes, mood up and down, hard to lose weight.  [facility].  She notes in the past had used hormonal injection a year or 2 ago but this was expensive.  Was getting pellet injection under screen for hormones, low dose estrogen.  S/p hysterectomy due to fibroids, age 54yo.  Nonsmoker.  Last physical 07/2013, last pelvic/breast exam 07/2013 here with me.  No hx/o DVT/PE, no famliy hx/o clots.    Wants to talk again about weight loss.  Hasn't take the weight loss medications since last visit here.    Here to f/u on anxiety, Xanax.  Taking xanax on average once per week.  Takes Wellbutrin daily.      Past Medical History  Diagnosis Date  . Hypertension   . GERD (gastroesophageal reflux disease)   . Diverticulitis 06/2013    3 total episodes  . Vitamin D deficiency   . Allergy   . Anxiety 2013    has seen Dr. Evelene CroonKaur prior  . Depression 2013    Dr. Evelene CroonKaur prior   Family History  Problem Relation Age of Onset  . Hypertension Other   . Diabetes Other   . Diabetes Mother   . Heart disease Mother   . Cancer Father     prostate, mets  . Diabetes Father   . Hypertension Sister   . Diabetes Sister   . Stroke Sister   . Hypertension Brother   . Obesity Brother   . Cancer Paternal Uncle     13 uncles all died with cancer, various types  . Hypertension Brother   . Other Brother     died of sepsis s/p perforated bowel  . Obesity Brother   . Heart disease Sister   . Hypertension Sister   . Diabetes Sister    ROS as in subjective   Objective: BP 120/82 mmHg  Pulse 86  Temp(Src) 98.1 F (36.7 C) (Oral)  Resp 15  Wt 242 lb (109.77 kg)   Wt Readings from Last 3 Encounters:  04/26/14 242 lb (109.77 kg)  02/22/14 236 lb (107.049 kg)  12/07/13 226 lb (102.513 kg)    General appearance: alert, no distress, WD/WN, obese AA  female Neck: supple, no lymphadenopathy, no thyromegaly, no masses, normal ROM, no bruits Heart: RRR, normal S1, S2, no murmurs Lungs: CTA bilaterally, no wheezes, rhonchi, or rales Abdomen: +bs, soft, non tender, non distended, no masses, no hepatomegaly, no splenomegaly, no bruits Extremities: no edema, no cyanosis, no clubbing Pulses: 2+ symmetric, upper and lower extremities, normal cap refill Neurological: alert, oriented x 3, CN2-12 intact, strength normal upper extremities and lower extremities, sensation normal throughout, DTRs 2+ throughout, no cerebellar signs, gait normal Psychiatric: normal affect, behavior normal, pleasant   Assessment: Encounter Diagnoses  Name Primary?  . Obesity Yes  . Post-menopausal   . Hot flashes   . Generalized anxiety disorder    Plan: Reviewed hemoglobin A1c today. Obesity-continue efforts at weight loss through healthy diet, exercise, try to do better at consistency and discipline efforts.  Restart Qsymia  Hot flashes, postmenopausal-I will review options and call her back. We discussed hormonal therapy, risk and benefits of hormonal therapy versus other options such as clonidine or Effexor. We discussed nonpharmacologic ways to deal with menopausal symptoms  Generalized anxiety disorder-continue Wellbutrin, Xanax when necessary

## 2014-04-27 NOTE — Telephone Encounter (Signed)
Patient states that when she was in for her OV that you had mention to her about a medication for DM and weight lost. She said that you would look at her A1C and then determine about that medication. Did you change your mind on that since you had me call out the Qysmia?

## 2014-04-27 NOTE — Telephone Encounter (Signed)
Yes after looking at several options a think for now doing the Qsymia for now and adding on clonidine for hot flashes makes sense.  Is she okay with the clonidine too?  Her diabetes screening marker wasn't bad enough to necessarily go the route of injectable medicine

## 2014-04-27 NOTE — Telephone Encounter (Signed)
I left a detailed message on the patients voice mail and to call back with any further questions

## 2014-05-05 ENCOUNTER — Encounter (HOSPITAL_COMMUNITY): Payer: Self-pay | Admitting: Emergency Medicine

## 2014-05-05 ENCOUNTER — Emergency Department (HOSPITAL_COMMUNITY)
Admission: EM | Admit: 2014-05-05 | Discharge: 2014-05-05 | Disposition: A | Discharge status: Home / Self Care | Payer: PRIVATE HEALTH INSURANCE | Source: Home / Self Care | Attending: Emergency Medicine | Admitting: Emergency Medicine

## 2014-05-05 DIAGNOSIS — R300 Dysuria: Secondary | ICD-10-CM

## 2014-05-05 LAB — POCT URINALYSIS DIP (DEVICE)
Bilirubin Urine: NEGATIVE
Glucose, UA: NEGATIVE mg/dL
Hgb urine dipstick: NEGATIVE
Ketones, ur: NEGATIVE mg/dL
Nitrite: NEGATIVE
Protein, ur: NEGATIVE mg/dL
Specific Gravity, Urine: 1.02 (ref 1.005–1.030)
Urobilinogen, UA: 0.2 mg/dL (ref 0.0–1.0)
pH: 7.5 (ref 5.0–8.0)

## 2014-05-05 MED ORDER — NITROFURANTOIN MONOHYD MACRO 100 MG PO CAPS: 100.0000 mg | ORAL_CAPSULE | Freq: Two times a day (BID) | ORAL | Status: DC

## 2014-05-05 NOTE — Discharge Instructions (Signed)

## 2014-05-05 NOTE — ED Notes (Signed)
Uti, onset Thursday per patient

## 2014-05-05 NOTE — ED Provider Notes (Signed)
CSN: [id number]     Arrival date & time 05/05/14  1728 History   First MD Initiated Contact with Patient 05/05/14 2006     Chief Complaint  Patient presents with  . Urinary Tract Infection   (Consider location/radiation/quality/duration/timing/severity/associated sxs/prior Treatment) HPI Comments: 3 days of dysuria, malodorous urine, and urinary frequency.   Patient is a 54 y.o. female presenting with urinary tract infection. The history is provided by the patient.  Urinary Tract Infection This is a new problem. The problem occurs constantly. The problem has been gradually worsening.    Past Medical History  Diagnosis Date  . Hypertension   . GERD (gastroesophageal reflux disease)   . Diverticulitis 06/2013    3 total episodes  . Vitamin D deficiency   . Allergy   . Anxiety 2013    has seen Dr. Evelene CroonKaur prior  . Depression 2013    Dr. Evelene CroonKaur prior   Past Surgical History  Procedure Laterality Date  . Cholecystectomy    . [place]  [place]; polyp and diverticulosis  . Wisdom tooth extraction    . Abdominal hysterectomy      fibroids, still has ovaries   Family History  Problem Relation Age of Onset  . Hypertension Other   . Diabetes Other   . Diabetes Mother   . Heart disease Mother   . Cancer Father     prostate, mets  . Diabetes Father   . Hypertension Sister   . Diabetes Sister   . Stroke Sister   . Hypertension Brother   . Obesity Brother   . Cancer Paternal Uncle     13 uncles all died with cancer, various types  . Hypertension Brother   . Other Brother     died of sepsis s/p perforated bowel  . Obesity Brother   . Heart disease Sister   . Hypertension Sister   . Diabetes Sister    History  Substance Use Topics  . Smoking status: Never Smoker   . Smokeless tobacco: Not on file  . Alcohol Use: 1.2 oz/week    2 Glasses of wine per week     Comment: rare   OB History    No data available     Review of Systems  All other systems  reviewed and are negative.   Allergies  Aspirin  Home Medications   Prior to Admission medications   Medication Sig Start Date End Date Taking? Authorizing Provider  ALPRAZolam Prudy Feeler(XANAX) 0.5 MG tablet Take 1 tablet (0.5 mg total) by mouth at bedtime as needed for anxiety. 04/26/14   Kermit Baloavid S Tysinger, PA-C  amLODipine (NORVASC) 5 MG tablet Take 1 tablet (5 mg total) by mouth daily. 07/28/13   Kermit Baloavid S Tysinger, PA-C  buPROPion (WELLBUTRIN XL) 300 MG 24 hr tablet Take 1 tablet (300 mg total) by mouth daily. Patient not taking: Reported on 05/05/2014 07/28/13   Kermit Baloavid S Tysinger, PA-C  cholecalciferol (VITAMIN D) 1000 UNITS tablet Take 2,000 Units by mouth daily.    Historical Provider, MD  esomeprazole (NEXIUM) 40 MG capsule Take 40 mg by mouth daily before breakfast.    Historical Provider, MD  Multiple Vitamin (MULITIVITAMIN WITH MINERALS) TABS Take 1 tablet by mouth every morning.     Historical Provider, MD  nitrofurantoin, macrocrystal-monohydrate, (MACROBID) 100 MG capsule Take 1 capsule (100 mg total) by mouth 2 (two) times daily. 05/05/14   Mathis FareJennifer Lee H Malinda Mayden, PA  omeprazole-sodium bicarbonate (ZEGERID) 40-1100 MG per capsule  Take 1 capsule by mouth daily before breakfast. 07/28/13   Kermit Balo Tysinger, PA-C  Phentermine-Topiramate (QSYMIA) 7.5-46 MG CP24 Take 1 capsule by mouth every morning. 04/26/14   Kermit Balo Tysinger, PA-C  triamterene-hydrochlorothiazide (MAXZIDE-25) 37.5-25 MG per tablet Take 1 tablet by mouth daily. 07/28/13   Kermit Balo Tysinger, PA-C   BP 128/91 mmHg  Pulse 72  Temp(Src) 99 F (37.2 C) (Oral)  Resp 16  SpO2 99% Physical Exam  Constitutional: She is oriented to person, place, and time. She appears well-developed and well-nourished. No distress.  HENT:  Head: Normocephalic and atraumatic.  Eyes: Conjunctivae are normal. No scleral icterus.  Cardiovascular: Normal rate, regular rhythm and normal heart sounds.   Pulmonary/Chest: Effort normal and breath sounds normal.   Abdominal: Soft. Bowel sounds are normal. She exhibits no distension. There is no tenderness. There is no CVA tenderness.  Musculoskeletal: Normal range of motion.  Neurological: She is alert and oriented to person, place, and time.  Skin: Skin is warm and dry.  Psychiatric: She has a normal mood and affect. Her behavior is normal.  Nursing note and vitals reviewed.   ED Course  Procedures (including critical care time) Labs Review Labs Reviewed  POCT URINALYSIS DIP (DEVICE) - Abnormal; Notable for the following:    Leukocytes, UA TRACE (*)    All other components within normal limits  URINE CULTURE    Imaging Review No results found.   MDM   1. Dysuria   While UA not frankly suggestive of UTI, while send specimen for C&S and provide Rx for Macrobid given clinical history. Advised patient to begin taking if symptoms persist or worsen. Will notify if C&S results indicate need for additional or modified treatment.   Ria Clock, [place] 05/05/14 2115

## 2014-05-07 LAB — URINE CULTURE
Colony Count: 1000
Special Requests: NORMAL

## 2014-06-29 ENCOUNTER — Emergency Department (HOSPITAL_COMMUNITY)
Admission: EM | Admit: 2014-06-29 | Discharge: 2014-06-29 | Disposition: A | Discharge status: Home / Self Care | Payer: Self-pay | Attending: Emergency Medicine | Admitting: Emergency Medicine

## 2014-06-29 ENCOUNTER — Emergency Department (INDEPENDENT_AMBULATORY_CARE_PROVIDER_SITE_OTHER)
Admission: EM | Admit: 2014-06-29 | Discharge: 2014-06-29 | Disposition: A | Discharge status: Nursing Home (Includes ICF) | Payer: Self-pay | Source: Home / Self Care | Attending: Family Medicine | Admitting: Family Medicine

## 2014-06-29 ENCOUNTER — Encounter (HOSPITAL_COMMUNITY): Payer: Self-pay | Admitting: Physical Medicine and Rehabilitation

## 2014-06-29 ENCOUNTER — Encounter (HOSPITAL_COMMUNITY): Payer: Self-pay | Admitting: Emergency Medicine

## 2014-06-29 ENCOUNTER — Emergency Department (HOSPITAL_COMMUNITY): Payer: Self-pay

## 2014-06-29 DIAGNOSIS — M79605 Pain in left leg: Secondary | ICD-10-CM

## 2014-06-29 DIAGNOSIS — R05 Cough: Secondary | ICD-10-CM | POA: Insufficient documentation

## 2014-06-29 DIAGNOSIS — I10 Essential (primary) hypertension: Secondary | ICD-10-CM | POA: Insufficient documentation

## 2014-06-29 DIAGNOSIS — M79609 Pain in unspecified limb: Secondary | ICD-10-CM

## 2014-06-29 DIAGNOSIS — Z79899 Other long term (current) drug therapy: Secondary | ICD-10-CM | POA: Insufficient documentation

## 2014-06-29 DIAGNOSIS — K219 Gastro-esophageal reflux disease without esophagitis: Secondary | ICD-10-CM | POA: Insufficient documentation

## 2014-06-29 DIAGNOSIS — I824Z2 Acute embolism and thrombosis of unspecified deep veins of left distal lower extremity: Secondary | ICD-10-CM | POA: Insufficient documentation

## 2014-06-29 DIAGNOSIS — E559 Vitamin D deficiency, unspecified: Secondary | ICD-10-CM | POA: Insufficient documentation

## 2014-06-29 DIAGNOSIS — I82402 Acute embolism and thrombosis of unspecified deep veins of left lower extremity: Secondary | ICD-10-CM

## 2014-06-29 DIAGNOSIS — F419 Anxiety disorder, unspecified: Secondary | ICD-10-CM | POA: Insufficient documentation

## 2014-06-29 DIAGNOSIS — M7989 Other specified soft tissue disorders: Secondary | ICD-10-CM

## 2014-06-29 DIAGNOSIS — F329 Major depressive disorder, single episode, unspecified: Secondary | ICD-10-CM | POA: Insufficient documentation

## 2014-06-29 LAB — CBC WITH DIFFERENTIAL/PLATELET
Basophils Absolute: 0 10*3/uL (ref 0.0–0.1)
Basophils Relative: 1 % (ref 0–1)
Eosinophils Absolute: 0.5 10*3/uL (ref 0.0–0.7)
Eosinophils Relative: 5 % (ref 0–5)
HCT: 44.6 % (ref 36.0–46.0)
Hemoglobin: 14.7 g/dL (ref 12.0–15.0)
Lymphocytes Relative: 42 % (ref 12–46)
Lymphs Abs: 3.5 10*3/uL (ref 0.7–4.0)
MCH: 27.8 pg (ref 26.0–34.0)
MCHC: 33 g/dL (ref 30.0–36.0)
MCV: 84.3 fL (ref 78.0–100.0)
Monocytes Absolute: 0.6 10*3/uL (ref 0.1–1.0)
Monocytes Relative: 8 % (ref 3–12)
Neutro Abs: 3.7 10*3/uL (ref 1.7–7.7)
Neutrophils Relative %: 44 % (ref 43–77)
Platelets: 248 10*3/uL (ref 150–400)
RBC: 5.29 MIL/uL — ABNORMAL HIGH (ref 3.87–5.11)
RDW: 15.8 % — ABNORMAL HIGH (ref 11.5–15.5)
WBC: 8.3 10*3/uL (ref 4.0–10.5)

## 2014-06-29 LAB — BASIC METABOLIC PANEL
Anion gap: 10 (ref 5–15)
BUN: 13 mg/dL (ref 6–23)
CO2: 27 mmol/L (ref 19–32)
Calcium: 9.3 mg/dL (ref 8.4–10.5)
Chloride: 101 mmol/L (ref 96–112)
Creatinine, Ser: 0.94 mg/dL (ref 0.50–1.10)
GFR calc Af Amer: 77 mL/min — ABNORMAL LOW (ref >90)
GFR calc non Af Amer: 67 mL/min — ABNORMAL LOW (ref >90)
Glucose, Bld: 108 mg/dL — ABNORMAL HIGH (ref 70–99)
Potassium: 3.9 mmol/L (ref 3.5–5.1)
Sodium: 138 mmol/L (ref 135–145)

## 2014-06-29 LAB — PROTIME-INR
INR: 0.96 (ref 0.00–1.49)
Prothrombin Time: 12.9 seconds (ref 11.6–15.2)

## 2014-06-29 LAB — ANTITHROMBIN III: AntiThromb III Func: 102 % (ref 75–120)

## 2014-06-29 LAB — APTT: aPTT: 27 seconds (ref 24–37)

## 2014-06-29 MED ORDER — DABIGATRAN ETEXILATE MESYLATE 150 MG PO CAPS: 150.0000 mg | ORAL_CAPSULE | Freq: Two times a day (BID) | ORAL | Status: DC

## 2014-06-29 MED ORDER — ENOXAPARIN SODIUM 100 MG/ML ~~LOC~~ SOLN
100.0000 mg | Freq: Once | SUBCUTANEOUS | Status: AC
  Administered 2014-06-29: 100 mg via SUBCUTANEOUS
  Filled 2014-06-29: qty 1

## 2014-06-29 MED ORDER — OXYCODONE-ACETAMINOPHEN 5-325 MG PO TABS
1.0000 | ORAL_TABLET | Freq: Once | ORAL | Status: AC
  Administered 2014-06-29: 1 via ORAL
  Filled 2014-06-29: qty 1

## 2014-06-29 MED ORDER — MORPHINE SULFATE 4 MG/ML IJ SOLN
4.0000 mg | Freq: Once | INTRAMUSCULAR | Status: AC
  Administered 2014-06-29: 4 mg via INTRAMUSCULAR
  Filled 2014-06-29: qty 1

## 2014-06-29 MED ORDER — ENOXAPARIN SODIUM 100 MG/ML ~~LOC~~ SOLN: 100.0000 mg | Freq: Two times a day (BID) | SUBCUTANEOUS | Status: DC

## 2014-06-29 MED ORDER — OXYCODONE-ACETAMINOPHEN 5-325 MG PO TABS: ORAL_TABLET | ORAL | Status: DC

## 2014-06-29 NOTE — Discharge Instructions (Signed)
Do not hesitate to return to the emergency room for any chest pain, cough, shortness of breath or trauma.  Follow ith Dr. Sherrlyn HockPandit ASAP. Take percocet for breakthrough pain, do not drink alcohol, drive, care for children or do other critical tasks while taking percocet.  Take the next Lovenox shot 12 hours from now. Take the shots for the next 7 days, start taking the Pradaxa  10-12 hours after the last lovenox dose   Please follow with your primary care doctor in the next 2 days for a check-up. They must obtain records for further management.   Do not hesitate to return to the Emergency Department for any new, worsening or concerning symptoms.   Deep Vein Thrombosis A deep vein thrombosis (DVT) is a blood clot that develops in the deep, larger veins of the leg, arm, or pelvis. These are more dangerous than clots that might form in veins near the surface of the body. A DVT can lead to serious and even life-threatening complications if the clot breaks off and travels in the bloodstream to the lungs.  A DVT can damage the valves in your leg veins so that instead of flowing upward, the blood pools in the lower leg. This is called post-thrombotic syndrome, and it can result in pain, swelling, discoloration, and sores on the leg. CAUSES Usually, several things contribute to the formation of blood clots. Contributing factors include:  The flow of blood slows down.  The inside of the vein is damaged in some way.  You have a condition that makes blood clot more easily. RISK FACTORS Some people are more likely than others to develop blood clots. Risk factors include:   Smoking.  Being overweight (obese).  Sitting or lying still for a long time. This includes long-distance travel, paralysis, or recovery from an illness or surgery. Other factors that increase risk are:   Older age, especially over 56 years of age.  Having a family history of blood clots or if you have already had a blot  clot.  Having major or lengthy surgery. This is especially true for surgery on the hip, knee, or belly (abdomen). Hip surgery is particularly high risk.  Having a long, thin tube (catheter) placed inside a vein during a medical procedure.  Breaking a hip or leg.  Having cancer or cancer treatment.  Pregnancy and childbirth.  Hormone changes make the blood clot more easily during pregnancy.  The fetus puts pressure on the veins of the pelvis.  There is a risk of injury to veins during delivery or a caesarean delivery. The risk is highest just after childbirth.  Medicines containing the female hormone estrogen. This includes birth control pills and hormone replacement therapy.  Other circulation or heart problems.  SIGNS AND SYMPTOMS When a clot forms, it can either partially or totally block the blood flow in that vein. Symptoms of a DVT can include:  Swelling of the leg or arm, especially if one side is much worse.  Warmth and redness of the leg or arm, especially if one side is much worse.  Pain in an arm or leg. If the clot is in the leg, symptoms may be more noticeable or worse when standing or walking. The symptoms of a DVT that has traveled to the lungs (pulmonary embolism, PE) usually start suddenly and include:  Shortness of breath.  Coughing.  Coughing up blood or blood-tinged mucus.  Chest pain. The chest pain is often worse with deep breaths.  Rapid heartbeat. Anyone  with these symptoms should get emergency medical treatment right away. Do not wait to see if the symptoms will go away. Call your local emergency services (911 in the [place] if you have these symptoms. Do not drive yourself to the hospital. DIAGNOSIS If a DVT is suspected, your health care provider will take a full medical history and perform a physical exam. Tests that also may be required include:  Blood tests, including studies of the clotting properties of the blood.  Ultrasound to see if you  have clots in your legs or lungs.  X-rays to show the flow of blood when dye is injected into the veins (venogram).  Studies of your lungs if you have any chest symptoms. PREVENTION  Exercise the legs regularly. Take a brisk 30-minute walk every day.  Maintain a weight that is appropriate for your height.  Avoid sitting or lying in bed for long periods of time without moving your legs.  Women, particularly those over the age of 35 years, should consider the risks and benefits of taking estrogen medicines, including birth control pills.  Do not smoke, especially if you take estrogen medicines.  Long-distance travel can increase your risk of DVT. You should exercise your legs by walking or pumping the muscles every hour.  Many of the risk factors above relate to situations that exist with hospitalization, either for illness, injury, or elective surgery. Prevention may include medical and nonmedical measures.  Your health care provider will assess you for the need for venous thromboembolism prevention when you are admitted to the hospital. If you are having surgery, your surgeon will assess you the day of or day after surgery. TREATMENT Once identified, a DVT can be treated. It can also be prevented in some circumstances. Once you have had a DVT, you may be at increased risk for a DVT in the future. The most common treatment for DVT is blood-thinning (anticoagulant) medicine, which reduces the blood's tendency to clot. Anticoagulants can stop new blood clots from forming and stop old clots from growing. They cannot dissolve existing clots. Your body does this by itself over time. Anticoagulants can be given by mouth, through an IV tube, or by injection. Your health care provider will determine the best program for you. Other medicines or treatments that may be used are:  Heparin or related medicines (low molecular weight heparin) are often the first treatment for a blood clot. They act quickly.  However, they cannot be taken orally and must be given either in shot form or by IV tube.  Heparin can cause a fall in a component of blood that stops bleeding and forms blood clots (platelets). You will be monitored with blood tests to be sure this does not occur.  Warfarin is an anticoagulant that can be swallowed. It takes a few days to start working, so usually heparin or related medicines are used in combination. Once warfarin is working, heparin is usually stopped.  Factor Xa inhibitor medicines, such as rivaroxaban and apixaban, also reduce blood clotting. These medicines are taken orally and can often be used without heparin or related medicines.  Less commonly, clot dissolving drugs (thrombolytics) are used to dissolve a DVT. They carry a high risk of bleeding, so they are used mainly in severe cases where your life or a part of your body is threatened.  Very rarely, a blood clot in the leg needs to be removed surgically.  If you are unable to take anticoagulants, your health care provider may arrange  for you to have a filter placed in a main vein in your abdomen. This filter prevents clots from traveling to your lungs. HOME CARE INSTRUCTIONS  Take all medicines as directed by your health care provider.  Learn as much as you can about DVT.  Wear a medical alert bracelet or carry a medical alert card.  Ask your health care provider how soon you can go back to normal activities. It is important to stay active to prevent blood clots. If you are on anticoagulant medicine, avoid contact sports.  It is very important to exercise. This is especially important while traveling, sitting, or standing for long periods of time. Exercise your legs by walking or by tightening and relaxing your leg muscles regularly. Take frequent walks.  You may need to wear compression stockings. These are tight elastic stockings that apply pressure to the lower legs. This pressure can help keep the blood in the  legs from clotting. Taking Warfarin Warfarin is a daily medicine that is taken by mouth. Your health care provider will advise you on the length of treatment (usually 3-6 months, sometimes lifelong). If you take warfarin:  Understand how to take warfarin and foods that can affect how warfarin works in Public relations account executive.  Too much and too little warfarin are both dangerous. Too much warfarin increases the risk of bleeding. Too little warfarin continues to allow the risk for blood clots. Warfarin and Regular Blood Testing While taking warfarin, you will need to have regular blood tests to measure your blood clotting time. These blood tests usually include both the prothrombin time (PT) and international normalized ratio (INR) tests. The PT and INR results allow your health care provider to adjust your dose of warfarin. It is very important that you have your PT and INR tested as often as directed by your health care provider.  Warfarin and Your Diet Avoid major changes in your diet, or notify your health care provider before changing your diet. Arrange a visit with a registered dietitian to answer your questions. Many foods, especially foods high in vitamin K, can interfere with warfarin and affect the PT and INR results. You should eat a consistent amount of foods high in vitamin K. Foods high in vitamin K include:   Spinach, kale, broccoli, cabbage, collard and turnip greens, Brussels sprouts, peas, cauliflower, seaweed, and parsley.  Beef and pork liver.  Green tea.  Soybean oil. Warfarin with Other Medicines Many medicines can interfere with warfarin and affect the PT and INR results. You must:  Tell your health care provider about any and all medicines, vitamins, and supplements you take, including aspirin and other over-the-counter anti-inflammatory medicines. Be especially cautious with aspirin and anti-inflammatory medicines. Ask your health care provider before taking these.  Do not take or  discontinue any prescribed or over-the-counter medicine except on the advice of your health care provider or pharmacist. Warfarin Side Effects Warfarin can have side effects, such as easy bruising and difficulty stopping bleeding. Ask your health care provider or pharmacist about other side effects of warfarin. You will need to:  Hold pressure over cuts for longer than usual.  Notify your dentist and other health care providers that you are taking warfarin before you undergo any procedures where bleeding may occur. Warfarin with Alcohol and Tobacco   Drinking alcohol frequently can increase the effect of warfarin, leading to excess bleeding. It is best to avoid alcoholic drinks or to consume only very small amounts while taking warfarin. Notify your health  care provider if you change your alcohol intake.   Do not use any tobacco products including cigarettes, chewing tobacco, or electronic cigarettes. If you smoke, quit. Ask your health care provider for help with quitting smoking. Alternative Medicines to Warfarin: Factor Xa Inhibitor Medicines  These blood-thinning medicines are taken by mouth, usually for several weeks or longer. It is important to take the medicine every single day at the same time each day.  There are no regular blood tests required when using these medicines.  There are fewer food and drug interactions than with warfarin.  The side effects of this class of medicine are similar to those of warfarin, including excessive bruising or bleeding. Ask your health care provider or pharmacist about other potential side effects. SEEK MEDICAL CARE IF:  You notice a rapid heartbeat.  You feel weaker or more tired than usual.  You feel faint.  You notice increased bruising.  You feel your symptoms are not getting better in the time expected.  You believe you are having side effects of medicine. SEEK IMMEDIATE MEDICAL CARE IF:  You have chest pain.  You have trouble  breathing.  You have new or increased swelling or pain in one leg.  You cough up blood.  You notice blood in vomit, in a bowel movement, or in urine. MAKE SURE YOU:  Understand these instructions.  Will watch your condition.  Will get help right away if you are not doing well or get worse. Document Released: 02/23/2005 Document Revised: 07/10/2013 Document Reviewed: 10/31/2012 [facility] Patient Information 2015 [place], [place]. This information is not intended to replace advice given to you by your health care provider. Make sure you discuss any questions you have with your health care provider.

## 2014-06-29 NOTE — Progress Notes (Signed)
VASCULAR LAB PRELIMINARY  PRELIMINARY  PRELIMINARY  PRELIMINARY  LLEV completed.    Preliminary report:  Positive acute partially occluding DVT one of two left mid distal peroneal veins only.  Loralie ChampagneBishop, Ephrem Carrick F, RVT 06/29/2014, 1:17 PM

## 2014-06-29 NOTE — ED Notes (Signed)
C/o lower left extremity swelling onset 2 days Sx include pain that is 10/10; increases w/activity Brought back in wheelchair Denies inj/trauma, fevers, chills Alert, no signs of acute distress.

## 2014-06-29 NOTE — ED Notes (Signed)
Pt presents to department for evaluation of L leg swelling and pain. Ongoing x2 days, [facility], sent to ED to r/o possible DVT. 9/10 pain upon arrival. Pt reports difficulty walking and bearing weight on L leg.

## 2014-06-29 NOTE — ED Provider Notes (Signed)
CSN: [id number]     Arrival date & time 06/29/14  1028 History   First MD Initiated Contact with Patient 06/29/14 1046     No chief complaint on file.  (Consider location/radiation/quality/duration/timing/severity/associated sxs/prior Treatment) HPI    54 year old female presents for evaluation of left lower extremity pain and swelling. This initially started about 2-3 weeks ago as some cramping. Last night the pain became severe and now she has swelling. The pain is in the back of her thigh in the back of her calf. It is so severe that she cannot walk on it today. She is concerned about a possibility of a DVT in her leg. She also has a cough, no chest pain or shortness of breath.  Past Medical History  Diagnosis Date  . Hypertension   . GERD (gastroesophageal reflux disease)   . Diverticulitis 06/2013    3 total episodes  . Vitamin D deficiency   . Allergy   . Anxiety 2013    has seen Dr. Evelene Croon prior  . Depression 2013    Dr. Evelene Croon prior   Past Surgical History  Procedure Laterality Date  . Cholecystectomy    . Colonoscopy  2011    [place], [place]; polyp and diverticulosis  . Wisdom tooth extraction    . Abdominal hysterectomy      fibroids, still has ovaries   Family History  Problem Relation Age of Onset  . Hypertension Other   . Diabetes Other   . Diabetes Mother   . Heart disease Mother   . Cancer Father     prostate, mets  . Diabetes Father   . Hypertension Sister   . Diabetes Sister   . Stroke Sister   . Hypertension Brother   . Obesity Brother   . Cancer Paternal Uncle     13 uncles all died with cancer, various types  . Hypertension Brother   . Other Brother     died of sepsis s/p perforated bowel  . Obesity Brother   . Heart disease Sister   . Hypertension Sister   . Diabetes Sister    History  Substance Use Topics  . Smoking status: Never Smoker   . Smokeless tobacco: Not on file  . Alcohol Use: 1.2 oz/week    2 Glasses of wine per week     Comment:  rare   OB History    No data available     Review of Systems  Cardiovascular: Positive for leg swelling (and pain in the left).  All other systems reviewed and are negative.   Allergies  Aspirin  Home Medications   Prior to Admission medications   Medication Sig Start Date End Date Taking? Authorizing Provider  ALPRAZolam Prudy Feeler) 0.5 MG tablet Take 1 tablet (0.5 mg total) by mouth at bedtime as needed for anxiety. 04/26/14   Kermit Balo Tysinger, PA-C  amLODipine (NORVASC) 5 MG tablet Take 1 tablet (5 mg total) by mouth daily. 07/28/13   Kermit Balo Tysinger, PA-C  buPROPion (WELLBUTRIN XL) 300 MG 24 hr tablet Take 1 tablet (300 mg total) by mouth daily. Patient not taking: Reported on 05/05/2014 07/28/13   Kermit Balo Tysinger, PA-C  cholecalciferol (VITAMIN D) 1000 UNITS tablet Take 2,000 Units by mouth daily.    Historical Provider, MD  esomeprazole (NEXIUM) 40 MG capsule Take 40 mg by mouth daily before breakfast.    Historical Provider, MD  Multiple Vitamin (MULITIVITAMIN WITH MINERALS) TABS Take 1 tablet by mouth every morning.  Historical Provider, MD  nitrofurantoin, macrocrystal-monohydrate, (MACROBID) 100 MG capsule Take 1 capsule (100 mg total) by mouth 2 (two) times daily. 05/05/14   Ria ClockJennifer Lee H Presson, PA  omeprazole-sodium bicarbonate (ZEGERID) 40-1100 MG per capsule Take 1 capsule by mouth daily before breakfast. 07/28/13   Kermit Baloavid S Tysinger, PA-C  Phentermine-Topiramate (QSYMIA) 7.5-46 MG CP24 Take 1 capsule by mouth every morning. 04/26/14   Kermit Baloavid S Tysinger, PA-C  triamterene-hydrochlorothiazide (MAXZIDE-25) 37.5-25 MG per tablet Take 1 tablet by mouth daily. 07/28/13   Kermit Baloavid S Tysinger, PA-C   BP 119/84 mmHg  Pulse 94  Temp(Src) 97.6 F (36.4 C) (Oral)  Resp 20  SpO2 100% Physical Exam  Constitutional: She is oriented to person, place, and time. Vital signs are normal. She appears well-developed and well-nourished. No distress.  HENT:  Head: Normocephalic and atraumatic.   Cardiovascular:  Pulses:      Dorsalis pedis pulses are 2+ on the left side.  Pulmonary/Chest: Effort normal. No respiratory distress.  Musculoskeletal:  Mild swelling of the left lower extremity with no edema. She is tender throughout the lower extremity on the left but worse in the back of the calf on the back of the thigh where she has a palpable cord  Neurological: She is alert and oriented to person, place, and time. She has normal strength. Coordination normal.  Skin: Skin is warm and dry. No rash noted. She is not diaphoretic.  Psychiatric: She has a normal mood and affect. Judgment normal.  Nursing note and vitals reviewed.   ED Course  Procedures (including critical care time) Labs Review Labs Reviewed - No data to display  Imaging Review No results found.   MDM   1. Left leg pain   2. Left leg swelling    Needs a DVT ruled out with venous duplex ultrasound. Transferred to ED via shuttle for evaluation      Graylon GoodZachary H Hailey Miles, PA-C 06/29/14 1055

## 2014-06-29 NOTE — ED Provider Notes (Signed)
CSN: [id number]     Arrival date & time 06/29/14  1127 History  This chart was scribed for non-physician practitioner Monico Blitz, PA working with Davonna Belling, MD, by Eustaquio Maize, ED Scribe. This patient was seen in room TR02C/TR02C and the patient's care was started at 2:05 PM.    Chief Complaint  Patient presents with  . Leg Pain   The history is provided by the patient. No language interpreter was used.     HPI Comments: Yolanda Brown is a 54 y.o. female who presents to the Emergency Department complaining of left lower extremity pain that began 1 day ago, worsening today. She states that the pain is localized below the knee and is worse in the calf area. Pt mentions that she has had a cramping sensation for the last month and attributed it to low potassium. She was unable to bare weight onto the leg 1 day ago, causing concern. She reports increased swelling to the leg that has since resolved while elevating leg. She mentions that she has had a dry cough for about 2 days.Pt was seen at Urgent Care earlier today. She was referred to the ED for DVT rule out. She mentions that the tech as Urgent Care told her she was positive for DVT. Pt has not been taking anything for the pain. She mentions that she received an estrogen shot in July 2015 but denies recent surgery or recent prolonged travel. 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  Pt denies chest pain, shortness of breath, rhinorrhea, hemoptysis, or any other symptoms. Pt is nonsmoker and denies hx of cancer. States that she had an increase in her IgG, she follows with hematology in [place] at [facility] she was told that this was a multiple myeloma-like condition but it was not multiple myeloma. 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  PCP - Tysinger  Heme onc:  [facility]  Past Medical History  Diagnosis Date  . Hypertension   . GERD (gastroesophageal reflux disease)   . Diverticulitis 06/2013    3 total episodes  . Vitamin D deficiency   . Allergy   . Anxiety 2013    has seen Dr. Toy Care prior  . Depression 2013    Dr. Toy Care prior   Past Surgical History  Procedure Laterality Date  . Cholecystectomy    . Colonoscopy  2011    [place], [place]; polyp and diverticulosis  . Wisdom tooth extraction    . Abdominal hysterectomy      fibroids, still has ovaries   Family History  Problem Relation Age of Onset  . Hypertension Other   . Diabetes Other   . Diabetes Mother   . Heart disease Mother   . Cancer Father     prostate, mets  . Diabetes Father   . Hypertension Sister   . Diabetes Sister   . Stroke Sister   . Hypertension Brother   . Obesity Brother   . Cancer Paternal Uncle     13 uncles all died with cancer, various types  . Hypertension Brother   . Other Brother     died of sepsis s/p perforated bowel  . Obesity Brother   . Heart disease Sister   . Hypertension Sister   . Diabetes Sister  History  Substance Use Topics  . Smoking status: Never Smoker   . Smokeless tobacco: Not on file  . Alcohol Use: 1.2 oz/week    2 Glasses of wine per week     Comment: rare   OB History    No data available     Review of Systems  A complete 10 system review of systems was obtained and all systems are negative except as noted in the HPI and PMH.    Allergies  Aspirin  Home Medications   Prior to Admission medications   Medication Sig Start Date End Date Taking? Authorizing Provider  ALPRAZolam Duanne Moron) 0.5 MG tablet Take 1 tablet (0.5 mg total) by mouth at bedtime as needed for anxiety. 04/26/14   Camelia Eng Tysinger, PA-C  amLODipine (NORVASC) 5 MG tablet Take 1 tablet (5 mg total) by mouth daily. 07/28/13   Camelia Eng Tysinger, PA-C  buPROPion (WELLBUTRIN XL) 300 MG 24 hr tablet Take 1 tablet (300 mg  total) by mouth daily. 07/28/13   Camelia Eng Tysinger, PA-C  cholecalciferol (VITAMIN D) 1000 UNITS tablet Take 2,000 Units by mouth daily.    Historical Provider, MD  dabigatran (PRADAXA) 150 MG CAPS capsule Take 1 capsule (150 mg total) by mouth 2 (two) times daily. Start taking 10-12 hours after the final Lovenox dose 06/29/14   Terrionna Bridwell, PA-C  enoxaparin (LOVENOX) 100 MG/ML injection Inject 1 mL (100 mg total) into the skin every 12 (twelve) hours. 06/29/14 07/06/14  Kenli Waldo, PA-C  esomeprazole (NEXIUM) 40 MG capsule Take 40 mg by mouth daily before breakfast.    Historical Provider, MD  Multiple Vitamin (MULITIVITAMIN WITH MINERALS) TABS Take 1 tablet by mouth every morning.     Historical Provider, MD  nitrofurantoin, macrocrystal-monohydrate, (MACROBID) 100 MG capsule Take 1 capsule (100 mg total) by mouth 2 (two) times daily. 05/05/14   Lutricia Feil, PA  omeprazole-sodium bicarbonate (ZEGERID) 40-1100 MG per capsule Take 1 capsule by mouth daily before breakfast. 07/28/13   Carlena Hurl, PA-C  oxyCODONE-acetaminophen (PERCOCET/ROXICET) 5-325 MG per tablet 1 to 2 tabs PO q6hrs  PRN for pain 06/29/14   Elmyra Ricks Enslie Sahota, PA-C  Phentermine-Topiramate (QSYMIA) 7.5-46 MG CP24 Take 1 capsule by mouth every morning. 04/26/14   Camelia Eng Tysinger, PA-C  triamterene-hydrochlorothiazide (MAXZIDE-25) 37.5-25 MG per tablet Take 1 tablet by mouth daily. 07/28/13   Carlena Hurl, PA-C   Triage Vitals: BP 119/82 mmHg  Pulse 95  Temp(Src) 97.8 F (36.6 C) (Oral)  Resp 18  SpO2 95%   Physical Exam  Constitutional: She is oriented to person, place, and time. She appears well-developed and well-nourished. No distress.  HENT:  Head: Normocephalic and atraumatic.  Eyes: Conjunctivae and EOM are normal.  Neck: Neck supple. No tracheal deviation present.  Cardiovascular: Normal rate.   Pulmonary/Chest: Effort normal. No respiratory distress.  Musculoskeletal: Normal range of motion.  She exhibits tenderness. She exhibits no edema.  Exquisite tenderness to palpation of left calf, there is no calf asymmetry or superficial collaterals. She is distally neurovascularly intact.  Neurological: She is alert and oriented to person, place, and time.  Skin: Skin is warm and dry.  Psychiatric: She has a normal mood and affect. Her behavior is normal.  Nursing note and vitals reviewed.   ED Course  Procedures (including critical care time)  DIAGNOSTIC STUDIES: Oxygen Saturation is 95% on RA, normal by my interpretation.    COORDINATION OF CARE: 2:09 PM-Discussed treatment plan which includes pain  medication with pt at bedside and pt agreed to plan.   VASCULAR LAB PRELIMINARY PRELIMINARY PRELIMINARY PRELIMINARY  LLEV completed.   Preliminary report: Positive acute partially occluding DVT one of two left mid distal peroneal veins only.  August Albino, RVT 06/29/2014, 1:17 PM   Labs Review Labs Reviewed  CBC WITH DIFFERENTIAL/PLATELET - Abnormal; Notable for the following:    RBC 5.29 (*)    RDW 15.8 (*)    All other components within normal limits  BASIC METABOLIC PANEL - Abnormal; Notable for the following:    Glucose, Bld 108 (*)    GFR calc non Af Amer 67 (*)    GFR calc Af Amer 77 (*)    All other components within normal limits  ANTITHROMBIN III  PROTIME-INR  APTT  PROTEIN C ACTIVITY  PROTEIN C, TOTAL  PROTEIN S ACTIVITY  PROTEIN S, TOTAL  LUPUS ANTICOAGULANT PANEL  BETA-2-GLYCOPROTEIN I ABS, IGG/M/A  HOMOCYSTEINE  FACTOR 5 LEIDEN  PROTHROMBIN GENE MUTATION  CARDIOLIPIN ANTIBODIES, IGG, IGM, IGA    Imaging Review Dg Chest 2 View  06/29/2014   CLINICAL DATA:  Current history of left lower extremity DVT. Current history of hypertension, GE reflux disease, anxiety and depression.  EXAM: CHEST  2 VIEW  COMPARISON:  05/18/2013 and earlier.  FINDINGS: Cardiac silhouette normal in size, unchanged. Thoracic aorta mildly tortuous, unchanged. Smaller  moderate-sized hiatal hernia, unchanged. Hilar and mediastinal contours otherwise unremarkable. Stable mild eventration of the right anterior hemidiaphragm. Lungs clear. Bronchovascular markings normal. Pulmonary vascularity normal. No visible pleural effusions. No pneumothorax. Mild degenerative changes throughout the thoracic spine. No significant interval change dating back to 2011.  IMPRESSION: 1.  No acute cardiopulmonary disease. 2. Stable small to moderate-sized hiatal hernia.   Electronically Signed   By: Evangeline Dakin M.D.   On: 06/29/2014 15:23     EKG Interpretation None      MDM   Final diagnoses:  DVT (deep venous thrombosis), left    Filed Vitals:   06/29/14 1139 06/29/14 1601  BP: 119/82 106/77  Pulse: 95 83  Temp: 97.8 F (36.6 C) 97.7 F (36.5 C)  TempSrc: Oral Oral  Resp: 18 18  SpO2: 95% 97%    Medications  morphine 4 MG/ML injection 4 mg (4 mg Intramuscular Given 06/29/14 1418)  enoxaparin (LOVENOX) injection 100 mg (100 mg Subcutaneous Given 06/29/14 1631)  oxyCODONE-acetaminophen (PERCOCET/ROXICET) 5-325 MG per tablet 1 tablet (1 tablet Oral Given 06/29/14 1631)    Aniyiah B Arbaugh is a pleasant 54 y.o. female sent from urgent care for evaluation of left lower extremity pain and swelling. Patient is distally neurovascularly intact, physical exam with no significant abnormality aside from tenderness.  Preliminary report shows left-sided partially occluding DVT in one of the 2 mid distal peroneal veins. Patient denies chest pain shortness of breath. She does endorse a dry cough. I've had an extensive discussion of this case with my attending physician Dr. Alvino Chapel who agrees that CT angios not indicated at this time. On further discussion with this patient states that she is being evaluated for IgG abnormality that is similar to multiple myeloma, she was supposed to have blood work checked every 6 months but she has not checked in with her hematologist in one year.  We've had an extensive discussion on the importance of following closely with her hematologist as this DVT appears to be unprovoked, patient also has a history of estrogen shots which she had last June.  In shared decision-making patient  has decided to start taking pradaxaas it now has a reversal agent. Case discussed with pharmacist Janett Billow: She recommends starting Pradaxa after 5 days of Lovenox. She recommends 100 mg twice a day. We have had an extensive discussion of this. Patient verbalized her understanding of plan.  Evaluation does not show pathology that would require ongoing emergent intervention or inpatient treatment. Pt is hemodynamically stable and mentating appropriately. Discussed findings and plan with patient/guardian, who agrees with care plan. All questions answered. Return precautions discussed and outpatient follow up given.   Discharge Medication List as of 06/29/2014  4:43 PM    START taking these medications   Details  dabigatran (PRADAXA) 150 MG CAPS capsule Take 1 capsule (150 mg total) by mouth 2 (two) times daily. Start taking 10-12 hours after the final Lovenox dose, Starting 06/29/2014, Until Discontinued, Print    enoxaparin (LOVENOX) 100 MG/ML injection Inject 1 mL (100 mg total) into the skin every 12 (twelve) hours., Starting 06/29/2014, Until Fri 07/06/14, Print    oxyCODONE-acetaminophen (PERCOCET/ROXICET) 5-325 MG per tablet 1 to 2 tabs PO q6hrs  PRN for pain, Print             Monico Blitz, PA-C 06/29/14 1735  Davonna Belling, MD 07/02/14 [phone]

## 2014-07-01 LAB — LUPUS ANTICOAGULANT PANEL
DRVVT: 33.6 s (ref 0.0–55.1)
PTT Lupus Anticoagulant: 32.1 s (ref 0.0–50.0)

## 2014-07-01 LAB — PROTEIN S, TOTAL: Protein S Ag, Total: 120 % (ref 58–150)

## 2014-07-01 LAB — PROTEIN S ACTIVITY: Protein S Activity: 102 % (ref 60–145)

## 2014-07-01 LAB — HOMOCYSTEINE: Homocysteine: 9.9 umol/L (ref 0.0–15.0)

## 2014-07-01 LAB — PROTEIN C ACTIVITY: Protein C Activity: 182 % — ABNORMAL HIGH (ref 74–151)

## 2014-07-02 LAB — BETA-2-GLYCOPROTEIN I ABS, IGG/M/A
Beta-2 Glyco I IgG: <9 GPI IgG units (ref 0–20)
Beta-2-Glycoprotein I IgA: <9 GPI IgA units (ref 0–25)
Beta-2-Glycoprotein I IgM: <9 GPI IgM units (ref 0–32)

## 2014-07-02 LAB — FACTOR 5 LEIDEN

## 2014-07-03 LAB — CARDIOLIPIN ANTIBODIES, IGG, IGM, IGA
Anticardiolipin IgA: <9 APL U/mL (ref 0–11)
Anticardiolipin IgG: <9 GPL U/mL (ref 0–14)
Anticardiolipin IgM: <9 MPL U/mL (ref 0–12)

## 2014-07-03 LAB — PROTHROMBIN GENE MUTATION

## 2014-07-03 LAB — PROTEIN C, TOTAL: Protein C, Total: 130 % (ref 70–140)

## 2014-07-09 ENCOUNTER — Ambulatory Visit (INDEPENDENT_AMBULATORY_CARE_PROVIDER_SITE_OTHER): Payer: 59 | Admitting: Medical

## 2014-07-09 ENCOUNTER — Encounter: Payer: Self-pay | Admitting: Medical

## 2014-07-09 VITALS — BP 110/80 | HR 116 | Temp 98.1°F | Resp 16 | Wt 255.0 lb

## 2014-07-09 DIAGNOSIS — T148XXA Other injury of unspecified body region, initial encounter: Secondary | ICD-10-CM

## 2014-07-09 DIAGNOSIS — T148 Other injury of unspecified body region: Secondary | ICD-10-CM

## 2014-07-09 DIAGNOSIS — I82402 Acute embolism and thrombosis of unspecified deep veins of left lower extremity: Secondary | ICD-10-CM

## 2014-07-09 NOTE — Progress Notes (Signed)
Subjective: Here for hospital f/u for DVT.  Was seen in the ED about a week ago for left lower leg swelling and pain, was diagnosed with DVT.  The only known trigger was the fact she had been using hormone replacement therapy.  No recent injury or trauma.  She does travel a lot on there job.   She was started on Lovenox x 7 days which she has completed ut has tender hematomas of lower abdomen.   Her leg is feeling better, less swelling and pain.  Is taking pradaxa.  She recently changed jobs and was in between insurances when she had this visit.  The pradaxa and Lovenox costs $600.   No hx/o DVT or stroke in self or family.  She does have hx/o hematology consult for multiple myeloma type issue.  No other aggravating or relieving factors. No other complaint.  Past Medical History  Diagnosis Date  . Hypertension   . GERD (gastroesophageal reflux disease)   . Diverticulitis 06/2013    3 total episodes  . Vitamin D deficiency   . Allergy   . Anxiety 2013    has seen Dr. Toy Care prior  . Depression 2013    Dr. Toy Care prior   ROS as in subjective  Objective: BP 110/80 mmHg  Pulse 116  Temp(Src) 98.1 F (36.7 C) (Oral)  Resp 16  Wt 255 lb (115.667 kg)  Gen: wd, wn, nad Leg: left lower leg with mild posteromedial tendnerss, slight asymmetry of calve diameter compared to right , otherwise nontender, no diffuse swelling, no erythema or warmth lungs clear Heart RRR normal s1, s2, no murmurs    Assessment:  Encounter Diagnoses  Name Primary?  . Left leg DVT Yes  . Hematoma    Plan: Reviewed recent ED reports, labs, imaging.   She has f/u planned with hematology.  She will c/t Pradaxa for at least 3-4 months.   If hematology finds other underlying cause, may need to be on anticoagulation lifelong, but if no underlying cause, the only inciting incident may have been HRT therapy.  Discussed her concerns.   C/t pradaxa, walking, compression hose, avoid injury or trauma.  F/u 98mo sooner prn.

## 2014-07-15 ENCOUNTER — Emergency Department (HOSPITAL_COMMUNITY)
Admission: EM | Admit: 2014-07-15 | Discharge: 2014-07-15 | Disposition: A | Discharge status: Home / Self Care | Payer: Self-pay | Attending: Emergency Medicine | Admitting: Emergency Medicine

## 2014-07-15 ENCOUNTER — Encounter (HOSPITAL_COMMUNITY): Payer: Self-pay | Admitting: Emergency Medicine

## 2014-07-15 DIAGNOSIS — Z79899 Other long term (current) drug therapy: Secondary | ICD-10-CM | POA: Insufficient documentation

## 2014-07-15 DIAGNOSIS — F419 Anxiety disorder, unspecified: Secondary | ICD-10-CM | POA: Insufficient documentation

## 2014-07-15 DIAGNOSIS — K219 Gastro-esophageal reflux disease without esophagitis: Secondary | ICD-10-CM | POA: Insufficient documentation

## 2014-07-15 DIAGNOSIS — F329 Major depressive disorder, single episode, unspecified: Secondary | ICD-10-CM | POA: Insufficient documentation

## 2014-07-15 DIAGNOSIS — I10 Essential (primary) hypertension: Secondary | ICD-10-CM | POA: Insufficient documentation

## 2014-07-15 DIAGNOSIS — I82402 Acute embolism and thrombosis of unspecified deep veins of left lower extremity: Secondary | ICD-10-CM | POA: Insufficient documentation

## 2014-07-15 DIAGNOSIS — Z8639 Personal history of other endocrine, nutritional and metabolic disease: Secondary | ICD-10-CM | POA: Insufficient documentation

## 2014-07-15 HISTORY — DX: Deep phlebothrombosis in pregnancy, unspecified trimester: O22.30

## 2014-07-15 LAB — APTT: aPTT: 35 seconds (ref 24–37)

## 2014-07-15 LAB — PROTIME-INR
INR: 1.06 (ref 0.00–1.49)
Prothrombin Time: 13.9 seconds (ref 11.6–15.2)

## 2014-07-15 MED ORDER — RIVAROXABAN 15 MG PO TABS
15.0000 mg | ORAL_TABLET | ORAL | Status: AC
  Administered 2014-07-15: 15 mg via ORAL
  Filled 2014-07-15: qty 1

## 2014-07-15 MED ORDER — MORPHINE SULFATE 4 MG/ML IJ SOLN
4.0000 mg | Freq: Once | INTRAMUSCULAR | Status: AC
  Administered 2014-07-15: 4 mg via INTRAMUSCULAR
  Filled 2014-07-15: qty 1

## 2014-07-15 MED ORDER — XARELTO VTE STARTER PACK 15 & 20 MG PO TBPK: 15.0000 mg | ORAL_TABLET | ORAL | Status: DC

## 2014-07-15 MED ORDER — OXYCODONE-ACETAMINOPHEN 5-325 MG PO TABS: ORAL_TABLET | ORAL | Status: DC

## 2014-07-15 NOTE — Discharge Instructions (Signed)
Please follow up with your primary care physician in 1-2 days. If you do not have one please call the [facility] number listed above. Please take pain medication and/or muscle relaxants as prescribed and as needed for pain. Please do not drive on narcotic pain medication or on muscle relaxants. Please stop Pradaxa and switch to Xarelto. Please read all discharge instructions and return precautions.    Deep Vein Thrombosis A deep vein thrombosis (DVT) is a blood clot that develops in the deep, larger veins of the leg, arm, or pelvis. These are more dangerous than clots that might form in veins near the surface of the body. A DVT can lead to serious and even life-threatening complications if the clot breaks off and travels in the bloodstream to the lungs.  A DVT can damage the valves in your leg veins so that instead of flowing upward, the blood pools in the lower leg. This is called post-thrombotic syndrome, and it can result in pain, swelling, discoloration, and sores on the leg. CAUSES Usually, several things contribute to the formation of blood clots. Contributing factors include:  The flow of blood slows down.  The inside of the vein is damaged in some way.  You have a condition that makes blood clot more easily. RISK FACTORS Some people are more likely than others to develop blood clots. Risk factors include:   Smoking.  Being overweight (obese).  Sitting or lying still for a long time. This includes long-distance travel, paralysis, or recovery from an illness or surgery. Other factors that increase risk are:   Older age, especially over 56 years of age.  Having a family history of blood clots or if you have already had a blot clot.  Having major or lengthy surgery. This is especially true for surgery on the hip, knee, or belly (abdomen). Hip surgery is particularly high risk.  Having a long, thin tube (catheter) placed inside a vein during a medical  procedure.  Breaking a hip or leg.  Having cancer or cancer treatment.  Pregnancy and childbirth.  Hormone changes make the blood clot more easily during pregnancy.  The fetus puts pressure on the veins of the pelvis.  There is a risk of injury to veins during delivery or a caesarean delivery. The risk is highest just after childbirth.  Medicines containing the female hormone estrogen. This includes birth control pills and hormone replacement therapy.  Other circulation or heart problems.  SIGNS AND SYMPTOMS When a clot forms, it can either partially or totally block the blood flow in that vein. Symptoms of a DVT can include:  Swelling of the leg or arm, especially if one side is much worse.  Warmth and redness of the leg or arm, especially if one side is much worse.  Pain in an arm or leg. If the clot is in the leg, symptoms may be more noticeable or worse when standing or walking. The symptoms of a DVT that has traveled to the lungs (pulmonary embolism, PE) usually start suddenly and include:  Shortness of breath.  Coughing.  Coughing up blood or blood-tinged mucus.  Chest pain. The chest pain is often worse with deep breaths.  Rapid heartbeat. Anyone with these symptoms should get emergency medical treatment right away. Do not wait to see if the symptoms will go away. Call your local emergency services (911 in the [place] if you have these symptoms. Do not drive yourself to the hospital. DIAGNOSIS If a DVT is suspected,  your health care provider will take a full medical history and perform a physical exam. Tests that also may be required include:  Blood tests, including studies of the clotting properties of the blood.  Ultrasound to see if you have clots in your legs or lungs.  X-rays to show the flow of blood when dye is injected into the veins (venogram).  Studies of your lungs if you have any chest symptoms. PREVENTION  Exercise the legs regularly. Take a brisk  30-minute walk every day.  Maintain a weight that is appropriate for your height.  Avoid sitting or lying in bed for long periods of time without moving your legs.  Women, particularly those over the age of 35 years, should consider the risks and benefits of taking estrogen medicines, including birth control pills.  Do not smoke, especially if you take estrogen medicines.  Long-distance travel can increase your risk of DVT. You should exercise your legs by walking or pumping the muscles every hour.  Many of the risk factors above relate to situations that exist with hospitalization, either for illness, injury, or elective surgery. Prevention may include medical and nonmedical measures.  Your health care provider will assess you for the need for venous thromboembolism prevention when you are admitted to the hospital. If you are having surgery, your surgeon will assess you the day of or day after surgery. TREATMENT Once identified, a DVT can be treated. It can also be prevented in some circumstances. Once you have had a DVT, you may be at increased risk for a DVT in the future. The most common treatment for DVT is blood-thinning (anticoagulant) medicine, which reduces the blood's tendency to clot. Anticoagulants can stop new blood clots from forming and stop old clots from growing. They cannot dissolve existing clots. Your body does this by itself over time. Anticoagulants can be given by mouth, through an IV tube, or by injection. Your health care provider will determine the best program for you. Other medicines or treatments that may be used are:  Heparin or related medicines (low molecular weight heparin) are often the first treatment for a blood clot. They act quickly. However, they cannot be taken orally and must be given either in shot form or by IV tube.  Heparin can cause a fall in a component of blood that stops bleeding and forms blood clots (platelets). You will be monitored with blood  tests to be sure this does not occur.  Warfarin is an anticoagulant that can be swallowed. It takes a few days to start working, so usually heparin or related medicines are used in combination. Once warfarin is working, heparin is usually stopped.  Factor Xa inhibitor medicines, such as rivaroxaban and apixaban, also reduce blood clotting. These medicines are taken orally and can often be used without heparin or related medicines.  Less commonly, clot dissolving drugs (thrombolytics) are used to dissolve a DVT. They carry a high risk of bleeding, so they are used mainly in severe cases where your life or a part of your body is threatened.  Very rarely, a blood clot in the leg needs to be removed surgically.  If you are unable to take anticoagulants, your health care provider may arrange for you to have a filter placed in a main vein in your abdomen. This filter prevents clots from traveling to your lungs. HOME CARE INSTRUCTIONS  Take all medicines as directed by your health care provider.  Learn as much as you can about DVT.  Wear  a medical alert bracelet or carry a medical alert card.  Ask your health care provider how soon you can go back to normal activities. It is important to stay active to prevent blood clots. If you are on anticoagulant medicine, avoid contact sports.  It is very important to exercise. This is especially important while traveling, sitting, or standing for long periods of time. Exercise your legs by walking or by tightening and relaxing your leg muscles regularly. Take frequent walks.  You may need to wear compression stockings. These are tight elastic stockings that apply pressure to the lower legs. This pressure can help keep the blood in the legs from clotting. Taking Warfarin Warfarin is a daily medicine that is taken by mouth. Your health care provider will advise you on the length of treatment (usually 3-6 months, sometimes lifelong). If you take  warfarin:  Understand how to take warfarin and foods that can affect how warfarin works in Public relations account executive.  Too much and too little warfarin are both dangerous. Too much warfarin increases the risk of bleeding. Too little warfarin continues to allow the risk for blood clots. Warfarin and Regular Blood Testing While taking warfarin, you will need to have regular blood tests to measure your blood clotting time. These blood tests usually include both the prothrombin time (PT) and international normalized ratio (INR) tests. The PT and INR results allow your health care provider to adjust your dose of warfarin. It is very important that you have your PT and INR tested as often as directed by your health care provider.  Warfarin and Your Diet Avoid major changes in your diet, or notify your health care provider before changing your diet. Arrange a visit with a registered dietitian to answer your questions. Many foods, especially foods high in vitamin K, can interfere with warfarin and affect the PT and INR results. You should eat a consistent amount of foods high in vitamin K. Foods high in vitamin K include:   Spinach, kale, broccoli, cabbage, collard and turnip greens, Brussels sprouts, peas, cauliflower, seaweed, and parsley.  Beef and pork liver.  Green tea.  Soybean oil. Warfarin with Other Medicines Many medicines can interfere with warfarin and affect the PT and INR results. You must:  Tell your health care provider about any and all medicines, vitamins, and supplements you take, including aspirin and other over-the-counter anti-inflammatory medicines. Be especially cautious with aspirin and anti-inflammatory medicines. Ask your health care provider before taking these.  Do not take or discontinue any prescribed or over-the-counter medicine except on the advice of your health care provider or pharmacist. Warfarin Side Effects Warfarin can have side effects, such as easy bruising and difficulty  stopping bleeding. Ask your health care provider or pharmacist about other side effects of warfarin. You will need to:  Hold pressure over cuts for longer than usual.  Notify your dentist and other health care providers that you are taking warfarin before you undergo any procedures where bleeding may occur. Warfarin with Alcohol and Tobacco   Drinking alcohol frequently can increase the effect of warfarin, leading to excess bleeding. It is best to avoid alcoholic drinks or to consume only very small amounts while taking warfarin. Notify your health care provider if you change your alcohol intake.   Do not use any tobacco products including cigarettes, chewing tobacco, or electronic cigarettes. If you smoke, quit. Ask your health care provider for help with quitting smoking. Alternative Medicines to Warfarin: Factor Xa Inhibitor Medicines  These blood-thinning  medicines are taken by mouth, usually for several weeks or longer. It is important to take the medicine every single day at the same time each day.  There are no regular blood tests required when using these medicines.  There are fewer food and drug interactions than with warfarin.  The side effects of this class of medicine are similar to those of warfarin, including excessive bruising or bleeding. Ask your health care provider or pharmacist about other potential side effects. SEEK MEDICAL CARE IF:  You notice a rapid heartbeat.  You feel weaker or more tired than usual.  You feel faint.  You notice increased bruising.  You feel your symptoms are not getting better in the time expected.  You believe you are having side effects of medicine. SEEK IMMEDIATE MEDICAL CARE IF:  You have chest pain.  You have trouble breathing.  You have new or increased swelling or pain in one leg.  You cough up blood.  You notice blood in vomit, in a bowel movement, or in urine. MAKE SURE YOU:  Understand these instructions.  Will  watch your condition.  Will get help right away if you are not doing well or get worse. Document Released: 02/23/2005 Document Revised: 07/10/2013 Document Reviewed: 10/31/2012 [facility] Patient Information 2015 [place], [place]. This information is not intended to replace advice given to you by your health care provider. Make sure you discuss any questions you have with your health care provider.

## 2014-07-15 NOTE — ED Provider Notes (Signed)
CSN: [id number]     Arrival date & time 07/15/14  0257 History   First MD Initiated Contact with Patient 07/15/14 0413     Chief Complaint  Patient presents with  . Leg Pain  . Leg Swelling     (Consider location/radiation/quality/duration/timing/severity/associated sxs/prior Treatment) HPI Comments: Patient is a 54 yo F presenting to the ED for evaluation of continued left calf pain and swelling after being diagnosed with a DVT on 4/22. Patient states she has been taking the Pradax as prescribed, but has continued to notice swelling and pain to the left calf. She has been taking the home pain medications as prescribed with little to no improvement. No modifying factors identified. Patient denies any SOB, CP.    Past Medical History  Diagnosis Date  . Hypertension   . GERD (gastroesophageal reflux disease)   . Diverticulitis 06/2013    3 total episodes  . Vitamin D deficiency   . Allergy   . Anxiety 2013    has seen Dr. Evelene Croon prior  . Depression 2013    Dr. Evelene Croon prior  . DVT (deep vein thrombosis) in pregnancy    Past Surgical History  Procedure Laterality Date  . Cholecystectomy    . Colonoscopy  2011    [place], [place]; polyp and diverticulosis  . Wisdom tooth extraction    . Abdominal hysterectomy      fibroids, still has ovaries   Family History  Problem Relation Age of Onset  . Hypertension Other   . Diabetes Other   . Diabetes Mother   . Heart disease Mother   . Cancer Father     prostate, mets  . Diabetes Father   . Hypertension Sister   . Diabetes Sister   . Stroke Sister   . Hypertension Brother   . Obesity Brother   . Cancer Paternal Uncle     13 uncles all died with cancer, various types  . Hypertension Brother   . Other Brother     died of sepsis s/p perforated bowel  . Obesity Brother   . Heart disease Sister   . Hypertension Sister   . Diabetes Sister    History  Substance Use Topics  . Smoking status: Never Smoker   . Smokeless tobacco: Not  on file  . Alcohol Use: 1.2 oz/week    2 Glasses of wine per week     Comment: rare   OB History    No data available     Review of Systems  Respiratory: Negative for shortness of breath.   Cardiovascular: Positive for leg swelling. Negative for chest pain.  Musculoskeletal: Positive for myalgias.  All other systems reviewed and are negative.     Allergies  Aspirin  Home Medications   Prior to Admission medications   Medication Sig Start Date End Date Taking? Authorizing Provider  ALPRAZolam Prudy Feeler) 0.5 MG tablet Take 1 tablet (0.5 mg total) by mouth at bedtime as needed for anxiety. 04/26/14  Yes Kermit Balo Tysinger, PA-C  amLODipine (NORVASC) 5 MG tablet Take 1 tablet (5 mg total) by mouth daily. 07/28/13  Yes Kermit Balo Tysinger, PA-C  buPROPion (WELLBUTRIN XL) 300 MG 24 hr tablet Take 1 tablet (300 mg total) by mouth daily. 07/28/13  Yes David S Tysinger, PA-C  Flaxseed, Linseed, (FLAX SEEDS PO) Take 1 tablet by mouth daily.   Yes Historical Provider, MD  Multiple Vitamin (MULITIVITAMIN WITH MINERALS) TABS Take 1 tablet by mouth every morning.    Yes Historical  Provider, MD  omeprazole-sodium bicarbonate (ZEGERID) 40-1100 MG per capsule Take 1 capsule by mouth daily before breakfast. 07/28/13  Yes Kermit Baloavid S Tysinger, PA-C  triamterene-hydrochlorothiazide (MAXZIDE-25) 37.5-25 MG per tablet Take 1 tablet by mouth daily. 07/28/13  Yes Kermit Baloavid S Tysinger, PA-C  nitrofurantoin, macrocrystal-monohydrate, (MACROBID) 100 MG capsule Take 1 capsule (100 mg total) by mouth 2 (two) times daily. Patient not taking: Reported on 07/09/2014 05/05/14   Ria ClockJennifer Lee H Presson, PA  oxyCODONE-acetaminophen (PERCOCET/ROXICET) 5-325 MG per tablet 1 to 2 tabs PO q6hrs  PRN for pain 07/15/14   Francee PiccoloJennifer Cina Klumpp, PA-C  Phentermine-Topiramate (QSYMIA) 7.5-46 MG CP24 Take 1 capsule by mouth every morning. Patient not taking: Reported on 07/09/2014 04/26/14   Kermit Baloavid S Tysinger, PA-C  XARELTO STARTER PACK 15 & 20 MG TBPK  Take 15-20 mg by mouth as directed. Take as directed on package: Start with one 15mg  tablet by mouth twice a day with food. On Day 22, switch to one 20mg  tablet once a day with food. 07/15/14   Michaelpaul Apo, PA-C   BP 108/70 mmHg  Pulse 86  Temp(Src) 98.1 F (36.7 C) (Oral)  Resp 20  Ht 5\' 6"  (1.676 m)  Wt 258 lb (117.028 kg)  BMI 41.66 kg/m2  SpO2 92% Physical Exam  Constitutional: She is oriented to person, place, and time. She appears well-developed and well-nourished. No distress.  HENT:  Head: Normocephalic and atraumatic.  Right Ear: External ear normal.  Left Ear: External ear normal.  Nose: Nose normal.  Mouth/Throat: Oropharynx is clear and moist.  Eyes: Conjunctivae are normal.  Neck: Normal range of motion. Neck supple.  No nuchal rigidity.   Cardiovascular: Normal rate, regular rhythm, normal heart sounds and intact distal pulses.   Pulmonary/Chest: Effort normal and breath sounds normal.  Abdominal: Soft.  Musculoskeletal: Normal range of motion. She exhibits tenderness.       Right knee: Normal.       Left knee: Normal.       Right ankle: Normal.       Left ankle: Normal.       Right lower leg: Normal.       Left lower leg: She exhibits tenderness and swelling.       Right foot: Normal.       Left foot: Normal.  Neurological: She is alert and oriented to person, place, and time.  Skin: Skin is warm and dry. She is not diaphoretic.  Psychiatric: She has a normal mood and affect.  Nursing note and vitals reviewed.   ED Course  Procedures (including critical care time) Medications  morphine 4 MG/ML injection 4 mg (4 mg Intramuscular Given 07/15/14 0436)  Rivaroxaban (XARELTO) tablet 15 mg (15 mg Oral Given 07/15/14 0530)    Labs Review Labs Reviewed  PROTIME-INR  APTT    Imaging Review No results found.   EKG Interpretation None      MDM   Final diagnoses:  DVT (deep venous thrombosis), left    Filed Vitals:   07/15/14 0545  BP: 108/70   Pulse: 86  Temp:   Resp: 20   Afebrile, NAD, non-toxic appearing, AAOx4.  I have reviewed nursing notes, vital signs, and all appropriate lab and imaging results if ordered as above.  Neurovascularly intact. Normal sensation. No evidence of compartment syndrome. No evidence of infection. Patient with known left DVT. No symptoms consistent with PE. No tachycardia, tachypnea, hypoxia or complaint of chest pain or shortness of breath. Pain managed in emergency department.  Will switch patient to Xarelto from Pradaxa. Advise PCP follow-up. Return precautions discussed. Patient is agreeable to plan. Patient is stable at time of discharge   Patient d/w with Dr. Judd Lienelo, agrees with plan.     Francee PiccoloJennifer Gertrude Tarbet, PA-C 07/15/14 [phone]  Geoffery Lyonsouglas Delo, MD 07/15/14 [phone]

## 2014-07-15 NOTE — Progress Notes (Signed)
54yo female c/o persistent LLE pain and swelling x2wk, has h/o DVT of same leg, to begin Xarelto.  Will give Xarelto 15mg  po x1 now.  If pt to be discharged and treated as outpatient, would recommend Xarelto 15 mg orally twice daily with food for 21 days followed by 20 mg orally once daily with food, taken at approximately the same time each day.  Vernard GamblesVeronda Norita Meigs, PharmD, BCPS 07/15/2014 5:06 AM

## 2014-07-15 NOTE — ED Notes (Signed)
Pt. reports persistent left leg pain with swelling onset 2 weeks ago , denies injury or fall , history of DVT on the same leg , pedal pulse present.

## 2014-07-15 NOTE — ED Notes (Signed)
Pt. Left with all belongings and refused wheelchair 

## 2014-07-27 ENCOUNTER — Ambulatory Visit (INDEPENDENT_AMBULATORY_CARE_PROVIDER_SITE_OTHER): Payer: No Typology Code available for payment source | Admitting: Medical

## 2014-07-27 VITALS — BP 100/80 | HR 106 | Temp 98.3°F | Resp 15 | Wt 250.0 lb

## 2014-07-27 DIAGNOSIS — R21 Rash and other nonspecific skin eruption: Secondary | ICD-10-CM

## 2014-07-27 DIAGNOSIS — F418 Other specified anxiety disorders: Secondary | ICD-10-CM | POA: Diagnosis not present

## 2014-07-27 MED ORDER — TRIAMCINOLONE ACETONIDE 0.1 % EX CREA: 1.0000 "application " | TOPICAL_CREAM | Freq: Two times a day (BID) | CUTANEOUS | Status: DC

## 2014-07-27 MED ORDER — PERMETHRIN 5 % EX CREA: 1.0000 "application " | TOPICAL_CREAM | Freq: Once | CUTANEOUS | Status: DC

## 2014-07-27 MED ORDER — BUPROPION HCL ER (XL) 300 MG PO TB24: 300.0000 mg | ORAL_TABLET | Freq: Every day | ORAL | Status: DC

## 2014-07-27 MED ORDER — CEPHALEXIN 500 MG PO CAPS: 500.0000 mg | ORAL_CAPSULE | Freq: Four times a day (QID) | ORAL | Status: DC

## 2014-07-27 NOTE — Progress Notes (Signed)
Subjective: Here for rash.   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  Has been around several sick people recently, and 1 patient has diffuse patches of rash and pustules.  She thinks one patient she has been around has scabies.    She is worried as she has some bumps that started 3-4 days ago on left upper inner thigh, lower right abdomen, right buttock and thigh.  There are very itchy.    Using hydrocortisone cream OTC with not much relief.  No fever, no NVD, no cough, lymph nodes swollen.  No other aggravating or relieving factors. No other complaint.  Needs refill on Wellbutrin.  Currently doing fine on the medication.  No c/o.  Mood has been overall fine.  Objective: BP 100/80 mmHg  Pulse 106  Temp(Src) 98.3 F (36.8 C) (Oral)  Resp 15  Wt 250 lb (113.399 kg)  YNW:GNFAGen:wdwn ,nad Skin: few scattered erythematous papules, 2-603mm diameter lesions on left upper thigh right lower abdomen, and right buttock and posterior thigh Psych: pleasant, good eye contact, answers questions appropriately  Assessment: Encounter Diagnoses  Name Primary?  . Rash and nonspecific skin eruption Yes  . Depression with anxiety      Plan: Discussed the rash, possible causes, what to look for in the event of folliculitis vs scabies.   Will start with triamcinolone alone since rash is nonspecific.  Depression with anxiety - refilled Wellbutrin, doing fine on this.   Patient Instructions  Rash today is nonspecific  NONSPECIFIC RASH Consider using the Triamcinolone cream to the bumps for the next few days.  And use a watch and wait approach.   SCABIES If you start to get more scattered bumps, particularly the hands or feet, some scaling, some new ones, then treat for scabies.  Given your exposure, consider bagging up clothes and bed sheets in a trash bag for 3 days sealed off.  Then wash them on hot cycle To treat potential scabies, use Permethrin cream on entire body at bedtime,  wash off in the morning once.  FOLLICULITIS In the event the rash looks more pustula such as several raised red bumps or whitish red bumps in patches, then begin keflex for folliculitis.   Rash doesn't currently look this way.

## 2014-07-27 NOTE — Patient Instructions (Signed)
Rash today is nonspecific  NONSPECIFIC RASH Consider using the Triamcinolone cream to the bumps for the next few days.  And use a watch and wait approach.   SCABIES If you start to get more scattered bumps, particularly the hands or feet, some scaling, some new ones, then treat for scabies.  Given your exposure, consider bagging up clothes and bed sheets in a trash bag for 3 days sealed off.  Then wash them on hot cycle To treat potential scabies, use Permethrin cream on entire body at bedtime, wash off in the morning once.  FOLLICULITIS In the event the rash looks more pustula such as several raised red bumps or whitish red bumps in patches, then begin keflex for folliculitis.   Rash doesn't currently look this way.

## 2014-08-14 ENCOUNTER — Telehealth: Payer: Self-pay | Admitting: Medical

## 2014-08-14 NOTE — Telephone Encounter (Signed)
ALERT- PT HAS NEW PHARMACY.  Pt stopped by and requested a refill on Xanax. Pt's [facility] PHARMACY is [facility] on [address]. Pt can be reached at [phone].

## 2014-08-15 ENCOUNTER — Other Ambulatory Visit: Payer: Self-pay | Admitting: Family Medicine

## 2014-08-15 MED ORDER — ALPRAZOLAM 0.5 MG PO TABS: 0.5000 mg | ORAL_TABLET | Freq: Every evening | ORAL | Status: DC | PRN

## 2014-08-15 NOTE — Telephone Encounter (Signed)
I called out Xanax to the patients new pharmacy and I also called the patient and made her aware of Kristian CoveyShane Tysinger PA message. Patient states she no longer works for [employer] so she had to choose a outside pharmacy to use

## 2014-08-15 NOTE — Telephone Encounter (Signed)
Call out Xanax, but have her keep a consistent pharmacy. When we prescribe controlled substances like Xanax, we need to stick with one pharmacy.

## 2014-08-31 ENCOUNTER — Telehealth: Payer: Self-pay | Admitting: Medical

## 2014-08-31 NOTE — Telephone Encounter (Signed)
Pt needs refill Xarelto to [facility] on [address]

## 2014-09-03 ENCOUNTER — Other Ambulatory Visit: Payer: Self-pay | Admitting: Medical

## 2014-09-03 MED ORDER — RIVAROXABAN 20 MG PO TABS: 20.0000 mg | ORAL_TABLET | Freq: Every day | ORAL | Status: DC

## 2014-09-03 NOTE — Telephone Encounter (Signed)
done

## 2014-09-05 ENCOUNTER — Telehealth: Payer: Self-pay | Admitting: Medical

## 2014-09-05 ENCOUNTER — Other Ambulatory Visit: Payer: Self-pay | Admitting: Family Medicine

## 2014-09-05 MED ORDER — TRIAMTERENE-HCTZ 37.5-25 MG PO TABS: 1.0000 | ORAL_TABLET | Freq: Every day | ORAL | Status: DC

## 2014-09-05 MED ORDER — AMLODIPINE BESYLATE 5 MG PO TABS: 5.0000 mg | ORAL_TABLET | Freq: Every day | ORAL | Status: DC

## 2014-09-05 NOTE — Telephone Encounter (Signed)
2 BP MEDICATIONS WAS SENT TO THE PHARMACY.

## 2014-09-05 NOTE — Telephone Encounter (Signed)
Pt needs refill on Norvasc and Maxzide to [facility], she is out of her meds

## 2014-10-08 ENCOUNTER — Telehealth: Payer: Self-pay | Admitting: Medical

## 2014-10-08 ENCOUNTER — Other Ambulatory Visit: Payer: Self-pay | Admitting: Medical

## 2014-10-08 MED ORDER — ALPRAZOLAM 0.5 MG PO TABS: 0.5000 mg | ORAL_TABLET | Freq: Every evening | ORAL | Status: DC | PRN

## 2014-10-08 NOTE — Telephone Encounter (Signed)
Refill request for Alprazolam 0.5 mg #45 from [facility]

## 2014-10-08 NOTE — Telephone Encounter (Signed)
Called out med to pharmacy 

## 2014-10-08 NOTE — Telephone Encounter (Signed)
Call out xanax, I re-ordered electronically, #45 and no refill

## 2014-11-16 ENCOUNTER — Telehealth: Payer: Self-pay

## 2014-11-16 NOTE — Telephone Encounter (Signed)
Refill request for Alprazolam 0.5mg  #45

## 2014-11-16 NOTE — Telephone Encounter (Signed)
Refill request for Xarelto  #30

## 2014-11-19 ENCOUNTER — Other Ambulatory Visit: Payer: Self-pay | Admitting: Medical

## 2014-11-19 MED ORDER — ALPRAZOLAM 0.5 MG PO TABS: 0.5000 mg | ORAL_TABLET | Freq: Every evening | ORAL | Status: DC | PRN

## 2014-11-19 MED ORDER — RIVAROXABAN 20 MG PO TABS: 20.0000 mg | ORAL_TABLET | Freq: Every day | ORAL | Status: DC

## 2014-11-19 NOTE — Telephone Encounter (Signed)
Call out 1 mo of Xanax, and lets get her back for med check, 30 min visit

## 2014-11-19 NOTE — Telephone Encounter (Signed)
Called med out to pharmacy and left a message for pt to call back and schedule a med check appt

## 2014-12-22 ENCOUNTER — Other Ambulatory Visit: Payer: Self-pay | Admitting: Medical

## 2014-12-24 NOTE — Telephone Encounter (Signed)
Is this ok to refill?  

## 2015-01-22 ENCOUNTER — Telehealth: Payer: Self-pay

## 2015-01-22 NOTE — Telephone Encounter (Signed)
Pt needs appt. I called but got no answer. LM to CB

## 2015-01-24 NOTE — Telephone Encounter (Signed)
Pt has appt tomorrow

## 2015-01-25 ENCOUNTER — Encounter: Payer: Self-pay | Admitting: Medical

## 2015-01-25 ENCOUNTER — Ambulatory Visit (INDEPENDENT_AMBULATORY_CARE_PROVIDER_SITE_OTHER): Payer: No Typology Code available for payment source | Admitting: Medical

## 2015-01-25 VITALS — BP 120/82 | HR 75 | Wt 254.0 lb

## 2015-01-25 DIAGNOSIS — E669 Obesity, unspecified: Secondary | ICD-10-CM | POA: Diagnosis not present

## 2015-01-25 DIAGNOSIS — Z86718 Personal history of other venous thrombosis and embolism: Secondary | ICD-10-CM | POA: Insufficient documentation

## 2015-01-25 DIAGNOSIS — F418 Other specified anxiety disorders: Secondary | ICD-10-CM | POA: Diagnosis not present

## 2015-01-25 DIAGNOSIS — I10 Essential (primary) hypertension: Secondary | ICD-10-CM | POA: Insufficient documentation

## 2015-01-25 DIAGNOSIS — F419 Anxiety disorder, unspecified: Principal | ICD-10-CM

## 2015-01-25 DIAGNOSIS — F329 Major depressive disorder, single episode, unspecified: Secondary | ICD-10-CM | POA: Insufficient documentation

## 2015-01-25 DIAGNOSIS — F32A Depression, unspecified: Secondary | ICD-10-CM | POA: Insufficient documentation

## 2015-01-25 HISTORY — DX: Personal history of other venous thrombosis and embolism: Z86.718

## 2015-01-25 MED ORDER — CITALOPRAM HYDROBROMIDE 20 MG PO TABS: ORAL_TABLET | ORAL | Status: DC

## 2015-01-25 MED ORDER — ALPRAZOLAM 0.5 MG PO TABS: 0.5000 mg | ORAL_TABLET | Freq: Every evening | ORAL | Status: DC | PRN

## 2015-01-25 NOTE — Patient Instructions (Signed)
Recommendations:  Wean off Wellbutrin by taking 1 tablet/capsule every other day until you run out  Begin Citalopram 1/2 tablet daily for 1 week at bedtime, then go to 1 tablet daily  Start exercising  I strongly recommend counseling with your pastor or an independent counselor.   I have listed some below  STOP Xarelto at this time  continue the rest of your medications as usual   Counseling services   [facility] [phone] office [email or web address] [address] [place], [place], [place] [address]  Gale JourneyLaura Atkinson, therapist  Franchot ErichsenErik Nelson, MA, clinical psychologist  Cognitive-Behavior Therapy; Mood Disorders; Anxiety Disorders; adult and child ADHD; Family Therapy; Stress Management; personal growth, and Marital Therapy.    Carlus Pavlovennis McKnight Ph.D., clinical psychologist Cognitive-Behavior Therapy; Mood Disorders; Anxiety Disorders; Stress     Management    [facility] Sheran Luzesiree Wilkinson, psychotherapist [address] [place], [place] [address] [phone]   [facility] Address: [address], [place], [place] [address] Phone: [phone]

## 2015-01-25 NOTE — Progress Notes (Signed)
Subjective: Chief Complaint  Patient presents with  . med check    on xanax. has a form she wants signed   Here for f/u on medications  Been really depressed lately.  Never looked into counseling after sister passed this past year.   Is emotional of late, crying spells.  Wellbutrin doesn't seem to be working.   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  No HI/SI.    Still taking Xarelto, diagnosed with DVT 06/2014, had normal hypercoagulable panel 07/2014, only 1 prior DVT in 06/2014 while on HRT  HTN - compliant with medication without c/o  Obesity - wants refill on Qsymia.   No motivation to exercise currently given depressed mood, wants medication to help since she has gained weight again  Past Medical History  Diagnosis Date  . Hypertension   . GERD (gastroesophageal reflux disease)   . Diverticulitis 06/2013    3 total episodes  . Vitamin D deficiency   . Allergy   . Anxiety 2013    has seen Dr. Evelene CroonKaur prior  . Depression 2013    Dr. Evelene CroonKaur prior  . DVT (deep vein thrombosis) in pregnancy    ROS as in subjective   Objective: BP 120/82 mmHg  Pulse 75  Wt 254 lb (115.214 kg)  Gen: wd, wn, nad Psych: tearful at times, depressed affect No leg tenderneess or swelling, no palpable cords Heart RRR, normal s1, s2, no murmurs Lungs clear No ext edema    Assessment: Encounter Diagnoses  Name Primary?  Marland Kitchen. Anxiety and depression Yes  . History of DVT (deep vein thrombosis)   . Essential hypertension   . Obesity      Plan: Anxiety and depression - wean off Wellbutrin by taking every other day for 2 weeks then stop.  Begin Citalopram 1/2 tablet daily for a week, then 1 tablet daily.   Advised counseling, gave list of counseling resources. F/u 3-4wk Hx/o DVT - one acute episode 06/2014 while on HRT, no prior, no problems since, has been on Xarelto since 07/2014.   Normal hypercoagulable panel back in 07/2014.   Stop Xarelto at this point HTN  - c/t same medication Obesity - I declined to give Qsymia today.  Advised she needed to start back exercising, return within a month for fasting labs and physical and we can reconsider weight loss strategies at that time Completed her forms for work showing vaccine records.

## 2015-01-31 ENCOUNTER — Other Ambulatory Visit: Payer: Self-pay | Admitting: Medical

## 2015-02-04 NOTE — Telephone Encounter (Signed)
Is this ok to refill?  

## 2015-03-01 ENCOUNTER — Telehealth: Payer: Self-pay | Admitting: Medical

## 2015-03-01 ENCOUNTER — Encounter: Payer: Self-pay | Admitting: Medical

## 2015-03-01 ENCOUNTER — Ambulatory Visit (INDEPENDENT_AMBULATORY_CARE_PROVIDER_SITE_OTHER): Payer: Self-pay | Admitting: Medical

## 2015-03-01 VITALS — BP 120/90 | HR 80 | Wt 249.0 lb

## 2015-03-01 DIAGNOSIS — E559 Vitamin D deficiency, unspecified: Secondary | ICD-10-CM

## 2015-03-01 DIAGNOSIS — E1110 Type 2 diabetes mellitus with ketoacidosis without coma: Secondary | ICD-10-CM

## 2015-03-01 DIAGNOSIS — E1310 Other specified diabetes mellitus with ketoacidosis without coma: Secondary | ICD-10-CM

## 2015-03-01 DIAGNOSIS — I10 Essential (primary) hypertension: Secondary | ICD-10-CM

## 2015-03-01 MED ORDER — VITAMIN D (ERGOCALCIFEROL) 1.25 MG (50000 UNIT) PO CAPS: 50000.0000 [IU] | ORAL_CAPSULE | ORAL | Status: DC

## 2015-03-01 NOTE — Telephone Encounter (Signed)
pls send pharmquest referral for diabetes study.  This patient is a new diagnosis.   Have them let me know ASAP if she doesn't qualify.

## 2015-03-01 NOTE — Progress Notes (Signed)
Subjective: Chief Complaint  Patient presents with  . discuss labs    seen dr sanders and had labs, a1c was 6.6 and she said that she needed to come here. states she is having dizzy spells. has been more thirsty and urinating more. said that she hasnt been able to eat alot but she cant eat much   Went to Dr. Allyne GeeSanders at [facility] recently for pap and had labs done.  HgbA1c was 6.6% and vit D was low, FSH showed menopausal.   Here to discuss these labs and recommendation.   Hypertension - checks somewhat regularly and usually 120-130 /80s.  compliant with Norvasc 5mg  and dyazide daily.   She notes eating a lot of rice and potatoes.  Brother just got diagnosed with diabetes recently.  She denies polyuria, polydipsia, vision changes or weight changes. No other aggravating or relieving factors. No other complaint.   Past Medical History  Diagnosis Date  . Hypertension   . GERD (gastroesophageal reflux disease)   . Diverticulitis 06/2013    3 total episodes  . Vitamin D deficiency   . Allergy   . Anxiety 2013    has seen Dr. Evelene CroonKaur prior  . Depression 2013    Dr. Evelene CroonKaur prior  . DVT (deep vein thrombosis) in pregnancy    ROS as in subjective  Objective: BP 120/90 mmHg  Pulse 80  Wt 249 lb (112.946 kg)  General appearance: alert, no distress, WD/WN Neck: supple, no lymphadenopathy, no thyromegaly, no masses Heart: RRR, normal S1, S2, no murmurs Lungs: CTA bilaterally, no wheezes, rhonchi, or rales Pulses: 2+ symmetric, upper and lower extremities, normal cap refill    Assessment: Encounter Diagnoses  Name Primary?  . Essential hypertension Yes  . Vitamin D deficiency   . Uncontrolled type 2 diabetes mellitus with ketoacidosis without coma, without long-term current use of insulin (HCC)      Plan: HTN - c/t same medication Vit D deficiency - begin Vit D 50K units weekly, and f/u 7mo Diabetes type 2 -  New diagnosis with prior glucose over 125 and recent HgbA1C 6.6%.   We  will look into pharmquest diabetes study.  If not qualified for study, then consider beginning metformin or invokamet.  discussed diet change, exercise, weight loss goals, diagnoses of diabetes, possible complications.  We will call back next week with more info.

## 2015-03-05 NOTE — Telephone Encounter (Signed)
Giving to Yolanda Brown to check which study then faxing

## 2015-03-06 NOTE — Telephone Encounter (Signed)
Send referral

## 2015-03-06 NOTE — Telephone Encounter (Signed)
done

## 2015-03-12 ENCOUNTER — Telehealth: Payer: Self-pay | Admitting: Medical

## 2015-03-12 MED ORDER — ALPRAZOLAM 0.5 MG PO TABS: 0.5000 mg | ORAL_TABLET | Freq: Every evening | ORAL | Status: DC | PRN

## 2015-03-12 NOTE — Telephone Encounter (Signed)
pls call this out if we haven't done this in the last 30 days

## 2015-03-12 NOTE — Telephone Encounter (Signed)
Rcvd refill request for Alprazolam 0.5 mg #45 °

## 2015-03-12 NOTE — Telephone Encounter (Signed)
Last refilled in 11/16 so refilled it #45 no refills

## 2015-03-13 ENCOUNTER — Encounter: Payer: Self-pay | Admitting: Medical

## 2015-03-15 ENCOUNTER — Telehealth: Payer: Self-pay | Admitting: Medical

## 2015-03-15 NOTE — Telephone Encounter (Signed)
[facility] request for for xanax 0.5mg  #30 1 tablet  By mouth every day.

## 2015-03-15 NOTE — Telephone Encounter (Signed)
Call it out 

## 2015-03-15 NOTE — Telephone Encounter (Signed)
We just refilled this for #45 on the 3rd are you sure i should call this out?

## 2015-03-17 NOTE — Telephone Encounter (Signed)
No, in that case verify with pharmacy why we got another request so soon?

## 2015-03-18 NOTE — Telephone Encounter (Signed)
Pharmacy did not answer after 5 minutes of holding will call back later today

## 2015-03-18 NOTE — Telephone Encounter (Signed)
The pharmacy said that she wanted [facility] and they where giving them the run around about it. She stated [facility] would discontinue the medication and that it would be filled at there pharmacy only.

## 2015-03-20 ENCOUNTER — Telehealth: Payer: Self-pay

## 2015-03-20 NOTE — Telephone Encounter (Signed)
Pharmquest called to let you know she was coming for the screening on tuesday

## 2015-04-08 ENCOUNTER — Ambulatory Visit (INDEPENDENT_AMBULATORY_CARE_PROVIDER_SITE_OTHER): Payer: Managed Care, Other (non HMO) | Admitting: Medical

## 2015-04-08 ENCOUNTER — Encounter: Payer: Self-pay | Admitting: Medical

## 2015-04-08 VITALS — BP 120/70 | HR 75 | Wt 251.0 lb

## 2015-04-08 DIAGNOSIS — E118 Type 2 diabetes mellitus with unspecified complications: Secondary | ICD-10-CM | POA: Diagnosis not present

## 2015-04-08 DIAGNOSIS — I10 Essential (primary) hypertension: Secondary | ICD-10-CM | POA: Diagnosis not present

## 2015-04-08 MED ORDER — LIRAGLUTIDE 18 MG/3ML ~~LOC~~ SOPN: 1.8000 mg | PEN_INJECTOR | Freq: Every day | SUBCUTANEOUS | Status: DC

## 2015-04-08 NOTE — Progress Notes (Signed)
Subjective: Chief Complaint  Patient presents with  . diabetes    went to pharmquest wasnt eligible. wants to discuss what to do now. wants to discuss weight loss medication    Here for f/u on diabetes.  New diagnosis last visit.   We were going to get her into pharmquest study but she didn't qualify.  Here to discuss this and getting back on weight loss medications.   Had formerly paid out of pocket for Qysmia, lost weight on this, had success, but then had to stop for period of time.  She is getting some exercise.  Trying to eat health foods, finds it hard to lose weight   No other new c/o.   Checking glucose regulalry ,seeing ok numbers.    Past Medical History  Diagnosis Date  . Hypertension   . GERD (gastroesophageal reflux disease)   . Diverticulitis 06/2013    3 total episodes  . Vitamin D deficiency   . Allergy   . Anxiety 2013    has seen Dr. Evelene Croon prior  . Depression 2013    Dr. Evelene Croon prior  . DVT (deep vein thrombosis) in pregnancy    ROS as in subjective   Objective: BP 120/70 mmHg  Pulse 75  Wt 251 lb (113.853 kg)  SpO2 96%  Wt Readings from Last 3 Encounters:  04/08/15 251 lb (113.853 kg)  03/01/15 249 lb (112.946 kg)  01/25/15 254 lb (115.214 kg)    Gen: wd, wn, nad    Assessment Encounter Diagnoses  Name Primary?  . Essential hypertension Yes  . Type 2 diabetes mellitus with complication, without long-term current use of insulin (HCC)     Plan: discussed option for therapy.  victoza is a great option to help with diabetes and possibly weight loss benefit.  discussed risks/benefits of medication, proper use of medication, discussed diet, exercise, monitoring glucose.  If needed if Victoza not approved, will change to Metformin and restart Qysmia.   F/u in 2-3 mo.  Regine was seen today for diabetes.  Diagnoses and all orders for this visit:  Essential hypertension  Type 2 diabetes mellitus with complication, without long-term current use of insulin  (HCC)  Other orders -     Liraglutide (VICTOZA) 18 MG/3ML SOPN; Inject 0.3 mLs (1.8 mg total) into the skin daily.

## 2015-04-24 ENCOUNTER — Telehealth: Payer: Self-pay | Admitting: Medical

## 2015-04-24 MED ORDER — ALPRAZOLAM 0.5 MG PO TABS: 0.5000 mg | ORAL_TABLET | Freq: Every evening | ORAL | Status: DC | PRN

## 2015-04-24 NOTE — Telephone Encounter (Signed)
Call out xanax #45, no refill  I assume you order these electronically as well with this type of message, so please order electronically as well, but use (phone in) instead of normal or printed.

## 2015-04-24 NOTE — Telephone Encounter (Signed)
Phoned in and put in as a phone in electronically

## 2015-04-24 NOTE — Telephone Encounter (Signed)
Rcvd Refill request for Alprazolam 0.5mg  #45

## 2015-05-20 ENCOUNTER — Encounter: Payer: Self-pay | Admitting: Family Medicine

## 2015-05-20 ENCOUNTER — Ambulatory Visit (INDEPENDENT_AMBULATORY_CARE_PROVIDER_SITE_OTHER): Payer: Managed Care, Other (non HMO) | Admitting: Family Medicine

## 2015-05-20 VITALS — BP 139/78 | HR 68 | Temp 98.2°F | Wt 250.8 lb

## 2015-05-20 DIAGNOSIS — R112 Nausea with vomiting, unspecified: Secondary | ICD-10-CM

## 2015-05-20 DIAGNOSIS — R6889 Other general symptoms and signs: Secondary | ICD-10-CM

## 2015-05-20 LAB — POC INFLUENZA A&B (BINAX/QUICKVUE)
Influenza A, POC: NEGATIVE
Influenza B, POC: NEGATIVE

## 2015-05-20 MED ORDER — ONDANSETRON HCL 4 MG PO TABS: 4.0000 mg | ORAL_TABLET | Freq: Three times a day (TID) | ORAL | Status: DC | PRN

## 2015-05-20 NOTE — Progress Notes (Signed)
Subjective:  Yolanda Brown is a 55 y.o. female who presents for a day history of 3 day history of symptoms that started with sore throat, post nasal drainage, dry cough and today at 4am she started vomiting liquidy emesis (no undigested food or blood) and feeling aching with chills. Last episode of vomiting was 7am today. She has been drinking fluids but a decreased amount.  She denies fever, ear pain, chest pain, palpitations, abdominal pain. No Urinary symptoms.  Last bowel movement was yesterday and normal per patient.  She works as a Engineer, civil (consulting)nurse.   Treatment to date: decongestants on Saturday, nothing since.  Positive sick contacts- grandson with flu.  No other aggravating or relieving factors.  No other c/o.  ROS as in subjective.   Objective: Filed Vitals:   05/20/15 1509  BP: 139/78  Pulse: 68  Temp: 98.2 F (36.8 C)    General appearance: Alert, WD/WN, no distress, mildly ill appearing                             Skin: warm, no rash                           Head: no sinus tenderness                            Eyes: conjunctiva normal, corneas clear, PERRLA                            Ears: pearly TMs, external ear canals normal                          Nose: septum midline, turbinates swollen, with erythema and clear discharge             Mouth/throat: MMM, tongue normal, mild pharyngeal erythema                           Neck: supple, no adenopathy, no thyromegaly, nontender                          Heart: RRR, normal S1, S2, no murmurs                         Lungs: CTA bilaterally, no wheezes, rales, or rhonchi   Abdomen: soft, nondistended, normal BS, diffusely tender, no rebound, guarding or referred pain, negative Mcburneys and Murphys.     Assessment: Flu-like symptoms  Non-intractable vomiting with nausea, unspecified vomiting type  Negative flu swab.   Plan: Discussed diagnosis and treatment of viral syndrome. Discussed negative flu swab does not 100% rule out  influenza. No indication for a bacterial infection. Suggested symptomatic OTC remedies. Zofran prescription sent to pharmacy. Discussed eating a bland diet and staying hydrated. Discussed red flags of dehydration or uncontrolled vomiting, worsening pain.  Tylenol or Ibuprofen OTC for fever and malaise.  Call/return in 2-3 days if symptoms aren't resolving.

## 2015-05-20 NOTE — Patient Instructions (Addendum)
Your strep and flu swabs were both negative. Your symptoms appear to be related to a viral illness. I recommend treating your symptoms and letting me know if you get worse or not improving in the next 2-3 days. Stay well hydrated.  Ginger ale for nausea, room temperature is best. Zofran for nausea. Bland diet for the next 24 hours and if you cannot keep fluids down, you may need to go to the emergency department for IV hydration.  Call the office Wednesday morning and let me know how you are doing.  Nausea and Vomiting Nausea is a sick feeling that often comes before throwing up (vomiting). Vomiting is a reflex where stomach contents come out of your mouth. Vomiting can cause severe loss of body fluids (dehydration). Children and elderly adults can become dehydrated quickly, especially if they also have diarrhea. Nausea and vomiting are symptoms of a condition or disease. It is important to find the cause of your symptoms. CAUSES   Direct irritation of the stomach lining. This irritation can result from increased acid production (gastroesophageal reflux disease), infection, food poisoning, taking certain medicines (such as nonsteroidal anti-inflammatory drugs), alcohol use, or tobacco use.  Signals from the brain.These signals could be caused by a headache, heat exposure, an inner ear disturbance, increased pressure in the brain from injury, infection, a tumor, or a concussion, pain, emotional stimulus, or metabolic problems.  An obstruction in the gastrointestinal tract (bowel obstruction).  Illnesses such as diabetes, hepatitis, gallbladder problems, appendicitis, kidney problems, cancer, sepsis, atypical symptoms of a heart attack, or eating disorders.  Medical treatments such as chemotherapy and radiation.  Receiving medicine that makes you sleep (general anesthetic) during surgery. DIAGNOSIS Your caregiver may ask for tests to be done if the problems do not improve after a few days.  Tests may also be done if symptoms are severe or if the reason for the nausea and vomiting is not clear. Tests may include:  Urine tests.  Blood tests.  Stool tests.  Cultures (to look for evidence of infection).  X-rays or other imaging studies. Test results can help your caregiver make decisions about treatment or the need for additional tests. TREATMENT You need to stay well hydrated. Drink frequently but in small amounts.You may wish to drink water, sports drinks, clear broth, or eat frozen ice pops or gelatin dessert to help stay hydrated.When you eat, eating slowly may help prevent nausea.There are also some antinausea medicines that may help prevent nausea. HOME CARE INSTRUCTIONS   Take all medicine as directed by your caregiver.  If you do not have an appetite, do not force yourself to eat. However, you must continue to drink fluids.  If you have an appetite, eat a normal diet unless your caregiver tells you differently.  Eat a variety of complex carbohydrates (rice, wheat, potatoes, bread), lean meats, yogurt, fruits, and vegetables.  Avoid high-fat foods because they are more difficult to digest.  Drink enough water and fluids to keep your urine clear or pale yellow.  If you are dehydrated, ask your caregiver for specific rehydration instructions. Signs of dehydration may include:  Severe thirst.  Dry lips and mouth.  Dizziness.  Dark urine.  Decreasing urine frequency and amount.  Confusion.  Rapid breathing or pulse. SEEK IMMEDIATE MEDICAL CARE IF:   You have blood or brown flecks (like coffee grounds) in your vomit.  You have black or bloody stools.  You have a severe headache or stiff neck.  You are confused.  You  have severe abdominal pain.  You have chest pain or trouble breathing.  You do not urinate at least once every 8 hours.  You develop cold or clammy skin.  You continue to vomit for longer than 24 to 48 hours.  You have a  fever. MAKE SURE YOU:   Understand these instructions.  Will watch your condition.  Will get help right away if you are not doing well or get worse.   This information is not intended to replace advice given to you by your health care provider. Make sure you discuss any questions you have with your health care provider.   Document Released: 02/23/2005 Document Revised: 05/18/2011 Document Reviewed: 07/23/2010 Elsevier Interactive Patient Education [employer] Elsevier Inc.

## 2015-05-21 LAB — POCT RAPID STREP A (OFFICE): Rapid Strep A Screen: NEGATIVE

## 2015-05-21 NOTE — Addendum Note (Signed)
Addended by: Herminio CommonsJOHNSON, Kajuan Guyton A on: 05/21/2015 11:00 AM   Modules accepted: Orders

## 2015-06-06 ENCOUNTER — Telehealth: Payer: Self-pay | Admitting: Medical

## 2015-06-06 NOTE — Telephone Encounter (Signed)
Left on pts vm that she needs appt.

## 2015-06-06 NOTE — Telephone Encounter (Signed)
Get her in for diabetes f/u 

## 2015-06-24 ENCOUNTER — Telehealth: Payer: Self-pay | Admitting: Medical

## 2015-06-24 MED ORDER — ALPRAZOLAM 0.5 MG PO TABS: 0.5000 mg | ORAL_TABLET | Freq: Every evening | ORAL | Status: DC | PRN

## 2015-06-24 NOTE — Telephone Encounter (Signed)
Rcvd refill request for Alprazolam 0.5 mg #45 °

## 2015-06-24 NOTE — Telephone Encounter (Signed)
Call out #45 xanax and get her in for diabetes f/u, labs.   Due for fasting labs.   I don't think we did labs last visit due to her not having insurance if I recall correctly.

## 2015-06-24 NOTE — Telephone Encounter (Signed)
Phoned in medication and pt stated that she is working out of town and when she gets back she will call and schedule

## 2015-06-28 ENCOUNTER — Telehealth: Payer: Self-pay | Admitting: Medical

## 2015-06-28 MED ORDER — AMLODIPINE BESYLATE 5 MG PO TABS: 5.0000 mg | ORAL_TABLET | Freq: Every day | ORAL | Status: DC

## 2015-06-28 MED ORDER — TRIAMTERENE-HCTZ 37.5-25 MG PO TABS: 1.0000 | ORAL_TABLET | Freq: Every day | ORAL | Status: DC

## 2015-06-28 NOTE — Telephone Encounter (Signed)
Done pt needs appt for any more refills

## 2015-06-28 NOTE — Telephone Encounter (Signed)
Rcvd refill request for Triamterene HCTZ 37.5-25 mg #90 & Amlodipine 5 mg #90

## 2015-07-17 ENCOUNTER — Encounter: Payer: Self-pay | Admitting: Medical

## 2015-07-17 ENCOUNTER — Ambulatory Visit
Admission: RE | Admit: 2015-07-17 | Discharge: 2015-07-17 | Disposition: A | Discharge status: Home / Self Care | Payer: Managed Care, Other (non HMO) | Source: Ambulatory Visit | Attending: Medical | Admitting: Medical

## 2015-07-17 ENCOUNTER — Ambulatory Visit (INDEPENDENT_AMBULATORY_CARE_PROVIDER_SITE_OTHER): Payer: Managed Care, Other (non HMO) | Admitting: Medical

## 2015-07-17 VITALS — BP 136/100 | HR 78 | Wt 260.0 lb

## 2015-07-17 DIAGNOSIS — M25469 Effusion, unspecified knee: Secondary | ICD-10-CM

## 2015-07-17 DIAGNOSIS — M545 Low back pain, unspecified: Secondary | ICD-10-CM

## 2015-07-17 DIAGNOSIS — I10 Essential (primary) hypertension: Secondary | ICD-10-CM | POA: Diagnosis not present

## 2015-07-17 DIAGNOSIS — R7301 Impaired fasting glucose: Secondary | ICD-10-CM | POA: Diagnosis not present

## 2015-07-17 DIAGNOSIS — M25561 Pain in right knee: Secondary | ICD-10-CM

## 2015-07-17 DIAGNOSIS — M25562 Pain in left knee: Secondary | ICD-10-CM | POA: Diagnosis not present

## 2015-07-17 DIAGNOSIS — E669 Obesity, unspecified: Secondary | ICD-10-CM | POA: Diagnosis not present

## 2015-07-17 LAB — URIC ACID: Uric Acid, Serum: 7.2 mg/dL — ABNORMAL HIGH (ref 2.4–7.0)

## 2015-07-17 MED ORDER — HYDROCODONE-ACETAMINOPHEN 5-325 MG PO TABS: 1.0000 | ORAL_TABLET | Freq: Four times a day (QID) | ORAL | Status: DC | PRN

## 2015-07-17 NOTE — Progress Notes (Signed)
Subjective: Chief Complaint  Patient presents with  . Knee Pain    both knees, and swelling. states she has done nothing differently. is also having lower back pain   Here for c/o knee pain, swelling, and back pain.   Feels like she's gained some weight since last visit.   Requesting different weight loss medication as Victoza did nothing.   Is not interested in weight loss surgery.  Wants to try another pill.    She notes pains in both knees all the time, on her feet all the time.  However, been having some swelling in both knees.  yesterday could barely move due to the pain and swelling.  Has hx/o DVT in left leg.  denies warm or hot leg, no leg cramping like the DVT prior.  No recent activity on her knees, no recent injury or trauma.  But was up for several hours this past weekend as they just moved.   Has had occasional swelling of knees in the past but not this bad.   Ankles feel swollen too.  Using some Aleve OTC.  Using OTC Advil some.   No finger pain or finger swelling.  Shoulders hurt a bit too.  No other aggravating or relieving factors. No other complaint.   Past Medical History  Diagnosis Date  . Hypertension   . GERD (gastroesophageal reflux disease)   . Diverticulitis 06/2013    3 total episodes  . Vitamin D deficiency   . Allergy   . Anxiety 2013    has seen Dr. Evelene Croon prior  . Depression 2013    Dr. Evelene Croon prior  . DVT (deep vein thrombosis) in pregnancy    Past Surgical History  Procedure Laterality Date  . Cholecystectomy    . Colonoscopy  2011    [place], [place]; polyp and diverticulosis  . Wisdom tooth extraction    . Abdominal hysterectomy      fibroids, still has ovaries   Current Outpatient Prescriptions on File Prior to Visit  Medication Sig Dispense Refill  . ALPRAZolam (XANAX) 0.5 MG tablet Take 1 tablet (0.5 mg total) by mouth at bedtime as needed for anxiety. 45 tablet 0  . amLODipine (NORVASC) 5 MG tablet Take 1 tablet (5 mg total) by mouth daily. 90  tablet 0  . Multiple Vitamin (MULITIVITAMIN WITH MINERALS) TABS Take 1 tablet by mouth every morning.     . triamterene-hydrochlorothiazide (MAXZIDE-25) 37.5-25 MG tablet Take 1 tablet by mouth daily. 90 tablet 0  . Vitamin D, Ergocalciferol, (DRISDOL) 50000 UNITS CAPS capsule Take 1 capsule (50,000 Units total) by mouth every 7 (seven) days. 4.5 capsule 3  . [DISCONTINUED] dabigatran (PRADAXA) 150 MG CAPS capsule Take 1 capsule (150 mg total) by mouth 2 (two) times daily. Start taking 10-12 hours after the final Lovenox dose 60 capsule 0   No current facility-administered medications on file prior to visit.     ROS as in subjective  Objective: BP 136/100 mmHg  Pulse 78  Wt 260 lb (117.935 kg)  BP Readings from Last 3 Encounters:  07/17/15 136/100  05/20/15 139/78  04/08/15 120/70   Wt Readings from Last 3 Encounters:  07/17/15 260 lb (117.935 kg)  05/20/15 250 lb 12.8 oz (113.762 kg)  04/08/15 251 lb (113.853 kg)   Gen:wd, wn, obese AA female, seems to be in pain Skin: slight warmth of left knee, but no erythema or bruising Left knee with mild generalized swelling, tender thought left knee, limited ROM due to pain  and guarded of left knee, tender left knee patella, joint line, but not medially and laterally.   Right knee with mild joint line tenderness and mild pain with right knee ROM which is relatively full, no laxity either knee, no swelling of right knee.   Otherwise hips and ankles with normal ROM and no swelling, no deformity 1+ pedal pulses Neuro: sensation WNL, right leg strength fine, can't fully assess left leg strength due to knee pain    Assessment Encounter Diagnoses  Name Primary?  . Knee pain, bilateral Yes  . Knee swelling, unspecified laterality   . Bilateral low back pain without sciatica   . Essential hypertension   . Obesity   . Impaired fasting blood sugar      Plan: Knee pain - go for xrays, can use the hydrocodone prn for pain, rest but she  notes that she can't rest the knees, has to go to work as she is the only bread winner in the family.  Labs today as well  obesity - return soon for updated labs, physical and discuss contrave  HTN - c/t same medication.  She notes normal readings out side our office  impaired glucose - return soon for labs and physical.  Lisanne was seen today for knee pain.  Diagnoses and all orders for this visit:  Knee pain, bilateral -     DG Knee Complete 4 Views Left; Future -     DG Knee Complete 4 Views Right; Future -     Uric acid -     Sedimentation rate  Knee swelling, unspecified laterality -     DG Knee Complete 4 Views Left; Future -     DG Knee Complete 4 Views Right; Future -     Uric acid -     Sedimentation rate  Bilateral low back pain without sciatica  Essential hypertension  Obesity -     DG Knee Complete 4 Views Left; Future -     DG Knee Complete 4 Views Right; Future  Impaired fasting blood sugar  Other orders -     HYDROcodone-acetaminophen (NORCO/VICODIN) 5-325 MG tablet; Take 1 tablet by mouth every 6 (six) hours as needed for moderate pain.

## 2015-07-18 ENCOUNTER — Other Ambulatory Visit: Payer: Self-pay | Admitting: Medical

## 2015-07-18 LAB — SEDIMENTATION RATE: Sed Rate: 4 mm/hr (ref 0–30)

## 2015-07-18 MED ORDER — COLCHICINE 0.6 MG PO TABS: 0.6000 mg | ORAL_TABLET | Freq: Two times a day (BID) | ORAL | Status: DC

## 2015-07-24 ENCOUNTER — Ambulatory Visit (INDEPENDENT_AMBULATORY_CARE_PROVIDER_SITE_OTHER): Payer: Managed Care, Other (non HMO) | Admitting: Medical

## 2015-07-24 ENCOUNTER — Encounter: Payer: Self-pay | Admitting: Medical

## 2015-07-24 VITALS — BP 102/80 | HR 101 | Temp 99.6°F | Resp 18 | Wt 253.0 lb

## 2015-07-24 DIAGNOSIS — R05 Cough: Secondary | ICD-10-CM | POA: Diagnosis not present

## 2015-07-24 DIAGNOSIS — R059 Cough, unspecified: Secondary | ICD-10-CM

## 2015-07-24 DIAGNOSIS — J209 Acute bronchitis, unspecified: Secondary | ICD-10-CM | POA: Diagnosis not present

## 2015-07-24 DIAGNOSIS — J029 Acute pharyngitis, unspecified: Secondary | ICD-10-CM

## 2015-07-24 LAB — POCT RAPID STREP A (OFFICE): Rapid Strep A Screen: NEGATIVE

## 2015-07-24 MED ORDER — AZITHROMYCIN 250 MG PO TABS: ORAL_TABLET | ORAL | Status: DC

## 2015-07-24 MED ORDER — HYDROCODONE-HOMATROPINE 5-1.5 MG/5ML PO SYRP: 5.0000 mL | ORAL_SOLUTION | Freq: Three times a day (TID) | ORAL | Status: DC | PRN

## 2015-07-24 NOTE — Progress Notes (Signed)
Subjective:  Stephania FragminFaith B Dukes is a 55 y.o. female who presents for illness.  She reports 3-4 days of feeling bad, cough, sore throat, achy, congestion.   Has coworker that was coughing all night last week.   Denies fever, no SOB, no NVD.  Possible wheezing.   Using zyrtec and mucinex DM.   No other aggravating or relieving factors. No other complaint.  The following portions of the patient's history were reviewed and updated as appropriate: allergies, current medications, past family history, past medical history, past social history, past surgical history and problem list.  ROS as in subjective  Past Medical History  Diagnosis Date  . Hypertension   . GERD (gastroesophageal reflux disease)   . Diverticulitis 06/2013    3 total episodes  . Vitamin D deficiency   . Allergy   . Anxiety 2013    has seen Dr. Evelene CroonKaur prior  . Depression 2013    Dr. Evelene CroonKaur prior  . DVT (deep vein thrombosis) in pregnancy      Objective: BP 102/80 mmHg  Pulse 101  Temp(Src) 99.6 F (37.6 C) (Tympanic)  Resp 18  Wt 253 lb (114.76 kg)  SpO2 96%  General appearance: Alert, WD/WN, no distress, ill appearing                             Skin: warm, no rash, no diaphoresis                           Head: no sinus tenderness                            Eyes: conjunctiva normal, corneas clear, PERRLA                            Ears: pearly TMs, external ear canals normal                          Nose: septum midline, turbinates swollen, with erythema and clear discharge             Mouth/throat: MMM, tongue normal, mild pharyngeal erythema                           Neck: supple, no adenopathy, no thyromegaly, nontender                          Heart: RRR, normal S1, S2, no murmurs                         Lungs: +bronchial breath sounds, +scattered rhonchi, no wheezes, no rales                Extremities: no edema, nontender     Assessment: Encounter Diagnoses  Name Primary?  . Acute bronchitis, unspecified  organism Yes  . Cough   . Sore throat      Plan:  Discussed diagnosis and treatment.  Advised rest, hydration, Mucinex DM, can use Hycodan syrup QHS if needed, but if not much improved or worse as discussed in the next 2-3 days, can begin Zpak, but symptoms suggest viral respiratory infection at this time.     Call or return if worse  or not improving in the next 3-4 days.  Taylar was seen today for sore throat.  Diagnoses and all orders for this visit:  Acute bronchitis, unspecified organism  Cough  Sore throat -     POCT rapid strep A  Other orders -     azithromycin (ZITHROMAX) 250 MG tablet; 2 tablets day 1, then 1 tablet days 2-4 -     HYDROcodone-homatropine (HYCODAN) 5-1.5 MG/5ML syrup; Take 5 mLs by mouth every 8 (eight) hours as needed for cough.

## 2015-08-06 ENCOUNTER — Encounter: Payer: Self-pay | Admitting: Medical

## 2015-08-06 ENCOUNTER — Ambulatory Visit (INDEPENDENT_AMBULATORY_CARE_PROVIDER_SITE_OTHER): Payer: Managed Care, Other (non HMO) | Admitting: Medical

## 2015-08-06 VITALS — BP 120/84 | HR 86 | Ht 66.0 in | Wt 254.0 lb

## 2015-08-06 DIAGNOSIS — E559 Vitamin D deficiency, unspecified: Secondary | ICD-10-CM

## 2015-08-06 DIAGNOSIS — F418 Other specified anxiety disorders: Secondary | ICD-10-CM | POA: Diagnosis not present

## 2015-08-06 DIAGNOSIS — I10 Essential (primary) hypertension: Secondary | ICD-10-CM | POA: Diagnosis not present

## 2015-08-06 DIAGNOSIS — Z1239 Encounter for other screening for malignant neoplasm of breast: Secondary | ICD-10-CM

## 2015-08-06 DIAGNOSIS — T162XXA Foreign body in left ear, initial encounter: Secondary | ICD-10-CM

## 2015-08-06 DIAGNOSIS — R400 Somnolence: Secondary | ICD-10-CM

## 2015-08-06 DIAGNOSIS — Z7189 Other specified counseling: Secondary | ICD-10-CM

## 2015-08-06 DIAGNOSIS — R0683 Snoring: Secondary | ICD-10-CM | POA: Insufficient documentation

## 2015-08-06 DIAGNOSIS — F419 Anxiety disorder, unspecified: Secondary | ICD-10-CM

## 2015-08-06 DIAGNOSIS — E669 Obesity, unspecified: Secondary | ICD-10-CM

## 2015-08-06 DIAGNOSIS — Z86718 Personal history of other venous thrombosis and embolism: Secondary | ICD-10-CM | POA: Diagnosis not present

## 2015-08-06 DIAGNOSIS — Z0000 Encounter for general adult medical examination without abnormal findings: Secondary | ICD-10-CM

## 2015-08-06 DIAGNOSIS — G4710 Hypersomnia, unspecified: Secondary | ICD-10-CM

## 2015-08-06 DIAGNOSIS — F32A Depression, unspecified: Secondary | ICD-10-CM

## 2015-08-06 DIAGNOSIS — G478 Other sleep disorders: Secondary | ICD-10-CM

## 2015-08-06 DIAGNOSIS — R7301 Impaired fasting glucose: Secondary | ICD-10-CM

## 2015-08-06 DIAGNOSIS — R5382 Chronic fatigue, unspecified: Secondary | ICD-10-CM

## 2015-08-06 DIAGNOSIS — Z7185 Encounter for immunization safety counseling: Secondary | ICD-10-CM

## 2015-08-06 DIAGNOSIS — F329 Major depressive disorder, single episode, unspecified: Secondary | ICD-10-CM

## 2015-08-06 HISTORY — DX: Chronic fatigue, unspecified: R53.82

## 2015-08-06 HISTORY — DX: Obesity, unspecified: E66.9

## 2015-08-06 HISTORY — DX: Snoring: R06.83

## 2015-08-06 HISTORY — DX: Impaired fasting glucose: R73.01

## 2015-08-06 HISTORY — DX: Encounter for general adult medical examination without abnormal findings: Z00.00

## 2015-08-06 HISTORY — DX: Encounter for immunization safety counseling: Z71.85

## 2015-08-06 HISTORY — DX: Somnolence: R40.0

## 2015-08-06 HISTORY — DX: Encounter for other screening for malignant neoplasm of breast: Z12.39

## 2015-08-06 LAB — CBC
HCT: 41.9 % (ref 35.0–45.0)
Hemoglobin: 13.8 g/dL (ref 11.7–15.5)
MCH: 26.8 pg — ABNORMAL LOW (ref 27.0–33.0)
MCHC: 32.9 g/dL (ref 32.0–36.0)
MCV: 81.4 fL (ref 80.0–100.0)
MPV: 10.8 fL (ref 7.5–12.5)
Platelets: 321 10*3/uL (ref 140–400)
RBC: 5.15 MIL/uL — ABNORMAL HIGH (ref 3.80–5.10)
RDW: 16 % — ABNORMAL HIGH (ref 11.0–15.0)
WBC: 5.9 10*3/uL (ref 4.0–10.5)

## 2015-08-06 LAB — POCT URINALYSIS DIPSTICK
Bilirubin, UA: NEGATIVE
Blood, UA: NEGATIVE
Glucose, UA: NEGATIVE
Ketones, UA: NEGATIVE
Leukocytes, UA: NEGATIVE
Nitrite, UA: NEGATIVE
Spec Grav, UA: >=1.03
Urobilinogen, UA: NEGATIVE
pH, UA: 6

## 2015-08-06 NOTE — Progress Notes (Signed)
Subjective:   HPI  Yolanda Brown is a 55 y.o. female who presents for a complete physical.  Concerns: Fatigue, does endorse day time somnolence, non restful sleep.  Does have hx/o sleep study that was reportedly  Normal several years ago.   Reviewed their medical, surgical, family, social, medication, and allergy history and updated chart as appropriate.  Past Medical History  Diagnosis Date  . Hypertension   . GERD (gastroesophageal reflux disease)   . Diverticulitis 06/2013    3 total episodes  . Vitamin D deficiency   . Allergy   . Anxiety 2013    has seen Dr. Evelene Croon prior  . Depression 2013    Dr. Evelene Croon prior  . DVT (deep vein thrombosis) in pregnancy     Past Surgical History  Procedure Laterality Date  . Cholecystectomy    . Colonoscopy  2011    [place], [place]; polyp and diverticulosis  . Wisdom tooth extraction    . Abdominal hysterectomy      fibroids, still has ovaries    Social History   Social History  . Marital Status: Married    Spouse Name: N/A  . Number of Children: N/A  . Years of Education: N/A   Occupational History  . Not on file.   Social History Main Topics  . Smoking status: Never Smoker   . Smokeless tobacco: Not on file  . Alcohol Use: 1.2 oz/week    2 Glasses of wine per week     Comment: rare  . Drug Use: No  . Sexual Activity: Not on file   Other Topics Concern  . 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 Hypertension Other   . Diabetes Other   . Diabetes Mother   . Heart disease Mother   . Cancer Father     prostate, mets  . Diabetes Father   . Hypertension Sister   . Diabetes Sister   . Stroke Sister   . Hypertension Brother   . Obesity Brother   . Cancer Paternal Uncle     13 uncles all died with cancer, various types  . Hypertension Brother   . Other Brother     died of sepsis s/p  perforated bowel  . Obesity Brother   . Heart disease Sister   . Hypertension Sister   . Diabetes Sister      Current outpatient prescriptions:  .  amLODipine (NORVASC) 5 MG tablet, Take 1 tablet (5 mg total) by mouth daily., Disp: 90 tablet, Rfl: 0 .  Multiple Vitamin (MULITIVITAMIN WITH MINERALS) TABS, Take 1 tablet by mouth every morning. , Disp: , Rfl:  .  triamterene-hydrochlorothiazide (MAXZIDE-25) 37.5-25 MG tablet, Take 1 tablet by mouth daily., Disp: 90 tablet, Rfl: 0 .  ALPRAZolam (XANAX) 0.5 MG tablet, Take 1 tablet (0.5 mg total) by mouth at bedtime as needed for anxiety. (Patient not taking: Reported on 08/06/2015), Disp: 45 tablet, Rfl: 0 .  HYDROcodone-acetaminophen (NORCO/VICODIN) 5-325 MG tablet, Take 1 tablet by mouth every 6 (six) hours as needed for moderate pain. (Patient not taking: Reported on 07/24/2015), Disp: 20 tablet, Rfl: 0 .  [DISCONTINUED] dabigatran (PRADAXA) 150 MG CAPS capsule, Take 1 capsule (150 mg total) by mouth 2 (two) times daily. Start taking 10-12 hours after the final Lovenox dose, Disp: 60 capsule, Rfl: 0  Allergies  Allergen Reactions  . Aspirin Hives    Review of Systems Constitutional: -fever, -chills, -sweats, -unexpected weight change, -decreased appetite, +fatigue Allergy: -sneezing, -itching, -congestion Dermatology: -changing moles, --rash, -lumps ENT: -runny nose, -ear pain, -sore throat, -hoarseness, -sinus pain, -teeth pain, - ringing in ears, -hearing loss, -nosebleeds Cardiology: -chest pain, -palpitations, -swelling, -difficulty breathing when lying flat, -waking up short of breath Respiratory: -cough, -shortness of breath, -difficulty breathing with exercise or exertion, -wheezing, -coughing up blood Gastroenterology: -abdominal pain, -nausea, -vomiting, -diarrhea, -constipation, -blood in stool, -changes in bowel movement, -difficulty swallowing or eating Hematology: -bleeding, -bruising  Musculoskeletal: -joint aches, -muscle  aches, -joint swelling, -back pain, -neck pain, -cramping, -changes in gait Ophthalmology: denies vision changes, eye redness, itching, discharge Urology: -burning with urination, -difficulty urinating, -blood in urine, -urinary frequency, -urgency, -incontinence Neurology: -headache, -weakness, -tingling, -numbness, -memory loss, -falls, -dizziness Psychology: +depressed mood, -agitation, -sleep problems     Objective:   Physical Exam  BP 120/84 mmHg  Pulse 86  Ht  (1.676 m)  Wt 254 lb (115.214 kg)  BMI 41.02 kg/m2  Wt Readings from Last 3 Encounters:  08/06/15 254 lb (115.214 kg)  07/24/15 253 lb (114.76 kg)  07/17/15 260 lb (117.935 kg)    General appearance: alert, no distress, WD/WN, obese AA female Skin: flat brown birth marks upper middle and upper right back, tattoos low back and left posterior shoulder, superman tattoo left lower lateral leg, scattered macules throughout, no worrisome lesions HEENT: normocephalic, conjunctiva/corneas normal, sclerae anicteric, PERRLA, EOMi, nares patent, no discharge or erythema, pharynx normal Oral cavity: MMM, tongue normal, teeth in good repair, some stain, and missing some upper molars Neck: supple, no lymphadenopathy, no thyromegaly, no masses, normal ROM, no bruits Chest: non tender, normal shape and expansion Heart: RRR, normal S1, S2, no murmurs Lungs: CTA bilaterally, no wheezes, rhonchi, or rales Abdomen: +bs, soft, non tender, non distended, no masses, no hepatomegaly, no splenomegaly, no bruits Back: non tender, normal ROM, no scoliosis Musculoskeletal: upper extremities non tender, no obvious deformity, normal ROM throughout, lower extremities non tender, no obvious deformity, normal ROM throughout Extremities: no edema, no cyanosis, no clubbing Pulses: 2+ symmetric, upper and lower extremities, normal cap refill Neurological: alert, oriented x 3, CN2-12 intact, strength normal upper extremities and lower extremities,  sensation normal throughout, DTRs 2+ throughout, no cerebellar signs, gait normal Psychiatric: normal affect, behavior normal, pleasant  Breast/gyn/rectal -breast without nodules or mass, no skin changes, no axillary lymphadenopathy Gyn: normal external genitalia, no mass, no adnexal mass or tenderness, s/p hysterectomy, no abnormal vagina discharge Rectal - deferred   Assessment and Plan :    Encounter Diagnoses  Name Primary?  . Routine general medical examination at a health care facility Yes  . Essential hypertension   . History of DVT (deep vein thrombosis)   . Anxiety and depression   . Impaired fasting blood sugar   . Obesity   . Vaccine counseling   . Screening for breast cancer   . Vitamin D deficiency   . Chronic fatigue   . Snoring   . Non-restorative sleep   . Daytime somnolence   . Foreign body in ear, left, initial encounter      Physical exam - discussed healthy lifestyle, diet, exercise, preventative care, vaccinations, and addressed their concerns.  Handout given.   Refilled all medications today See your eye doctor yearly for routine vision care. See your dentist yearly for routine dental care including hygiene visits twice yearly.  Anxiety  and depression - no particular concern or discussion about this today.  Impaired glucose - labs today  Obesity - c/t efforts at weight loss through healthy diet and exercise  discussed vaccines, c/t yearly flu shot  She will schedule mammogram  HTN - controlled on current medication  Fatigue - epworth sleep score of 15.  discussed possible causes, labs today and consider sleep study  Foreign body of left ear canal - successfully removed cotton swab from left ear canal with alligator forceps, no complication, patient tolerated procedure well.  Follow-up pending labs

## 2015-08-07 ENCOUNTER — Other Ambulatory Visit: Payer: Self-pay | Admitting: Medical

## 2015-08-07 LAB — COMPREHENSIVE METABOLIC PANEL
ALT: 24 U/L (ref 6–29)
AST: 25 U/L (ref 10–35)
Albumin: 3.9 g/dL (ref 3.6–5.1)
Alkaline Phosphatase: 67 U/L (ref 33–130)
BUN: 12 mg/dL (ref 7–25)
CO2: 28 mmol/L (ref 20–31)
Calcium: 9.3 mg/dL (ref 8.6–10.4)
Chloride: 104 mmol/L (ref 98–110)
Creat: 0.75 mg/dL (ref 0.50–1.05)
Glucose, Bld: 103 mg/dL — ABNORMAL HIGH (ref 65–99)
Potassium: 4.1 mmol/L (ref 3.5–5.3)
Sodium: 141 mmol/L (ref 135–146)
Total Bilirubin: 0.4 mg/dL (ref 0.2–1.2)
Total Protein: 7.5 g/dL (ref 6.1–8.1)

## 2015-08-07 LAB — LIPID PANEL
Cholesterol: 175 mg/dL (ref 125–200)
HDL: 37 mg/dL — ABNORMAL LOW (ref >=46)
LDL Cholesterol: 99 mg/dL (ref <130)
Total CHOL/HDL Ratio: 4.7 Ratio (ref <=5.0)
Triglycerides: 193 mg/dL — ABNORMAL HIGH (ref <150)
VLDL: 39 mg/dL — ABNORMAL HIGH (ref <30)

## 2015-08-07 LAB — MICROALBUMIN / CREATININE URINE RATIO
Creatinine, Urine: 284 mg/dL (ref 20–320)
Microalb Creat Ratio: 32 mcg/mg creat — ABNORMAL HIGH (ref <30)
Microalb, Ur: 9.1 mg/dL

## 2015-08-07 LAB — HEMOGLOBIN A1C
Hgb A1c MFr Bld: 6.6 % — ABNORMAL HIGH (ref <5.7)
Mean Plasma Glucose: 143 mg/dL

## 2015-08-07 LAB — VITAMIN D 25 HYDROXY (VIT D DEFICIENCY, FRACTURES): Vit D, 25-Hydroxy: 29 ng/mL — ABNORMAL LOW (ref 30–100)

## 2015-08-07 LAB — TSH: TSH: 0.74 mIU/L

## 2015-08-07 MED ORDER — AMLODIPINE BESYLATE 5 MG PO TABS: 5.0000 mg | ORAL_TABLET | Freq: Every day | ORAL | Status: DC

## 2015-08-07 MED ORDER — NALTREXONE-BUPROPION HCL ER 8-90 MG PO TB12: 2.0000 | ORAL_TABLET | Freq: Two times a day (BID) | ORAL | Status: DC

## 2015-08-07 MED ORDER — VITAMIN D (ERGOCALCIFEROL) 1.25 MG (50000 UNIT) PO CAPS: 50000.0000 [IU] | ORAL_CAPSULE | ORAL | Status: DC

## 2015-08-07 MED ORDER — TRIAMTERENE-HCTZ 37.5-25 MG PO TABS: 1.0000 | ORAL_TABLET | Freq: Every day | ORAL | Status: DC

## 2015-08-16 ENCOUNTER — Telehealth: Payer: Self-pay | Admitting: Medical

## 2015-08-16 NOTE — Telephone Encounter (Signed)
P.A. CONTRAVE  

## 2015-08-21 NOTE — Telephone Encounter (Signed)
Called [employer] t# [phone] and all weight loss meds are plan exclusions.

## 2015-08-21 NOTE — Telephone Encounter (Signed)
Letter of appeal typed & faxed to [phone]

## 2015-08-26 ENCOUNTER — Telehealth: Payer: Self-pay | Admitting: Medical

## 2015-08-26 MED ORDER — ALPRAZOLAM 0.5 MG PO TABS: 0.5000 mg | ORAL_TABLET | Freq: Every evening | ORAL | Status: DC | PRN

## 2015-08-26 NOTE — Telephone Encounter (Signed)
Call out and order electronically as well

## 2015-08-26 NOTE — Telephone Encounter (Signed)
Rcvd refill request for Alprazolam 0.5 mg #45 °

## 2015-08-26 NOTE — Telephone Encounter (Signed)
done

## 2015-09-05 ENCOUNTER — Telehealth: Payer: Self-pay | Admitting: Medical

## 2015-09-05 NOTE — Telephone Encounter (Signed)
unfortunately I don't think phentermine is safe for her, and its not meant to be used more than 1-2 months in a given time frame as well.

## 2015-09-05 NOTE — Telephone Encounter (Signed)
Appeal denied, states exclusion is based on pharmacy plan deisgn, not medical necessity or prescription drug list. Only other option is for her to pay out of pocket, cheapest out of pocket is Phentermine, do you want to switch ?

## 2015-09-05 NOTE — Telephone Encounter (Signed)
When is the last time she saw nutritionist?  This is an option.  Weight Watchers program is another option or supervised exercise such as with trainer or [employer] or similar gym.

## 2015-09-11 ENCOUNTER — Other Ambulatory Visit: Payer: Self-pay | Admitting: Medical

## 2015-09-11 MED ORDER — PHENTERMINE-TOPIRAMATE ER 7.5-46 MG PO CP24: 1.0000 | ORAL_CAPSULE | ORAL | Status: DC

## 2015-09-11 NOTE — Telephone Encounter (Signed)
Call out Qsymia, i ordered electronically but it needs to be called out.    I thought we stopped this a few months back as it wasn't working?  Or was it due to cost?

## 2015-09-11 NOTE — Telephone Encounter (Signed)
Said that she felt like the longer she was on it that the more she felt it didn't work. Did lose weight at first then it stopped

## 2015-09-11 NOTE — Telephone Encounter (Signed)
Pt informed of suggestions, also states she can't take the Contrave anyway because it caused bad headaches.  She will check into joining a gym.   She asked if you would put her back on the Qsymia, states it worked well for her until she fell off the wagon.  Please call pt and let her know, ok to leave a message

## 2015-09-11 NOTE — Telephone Encounter (Signed)
Per Vincenza HewsShane.... When is the last time she saw nutritionist? This is an option. Weight Watchers program is another option or supervised exercise such as with trainer or [employer] or similar gym. Left message for pt

## 2015-09-11 NOTE — Telephone Encounter (Signed)
See msg

## 2015-09-11 NOTE — Telephone Encounter (Signed)
Phoned in medication and LM for pt to call back

## 2015-09-13 ENCOUNTER — Telehealth: Payer: Self-pay

## 2015-09-13 ENCOUNTER — Telehealth: Payer: Self-pay | Admitting: Medical

## 2015-09-13 NOTE — Telephone Encounter (Signed)
Activated discount card.  Called pharmacy cost was [phone] with discount it is $111.  Called pt & she will pick up Rx.  Called pharmacy back & they will order

## 2015-09-13 NOTE — Telephone Encounter (Signed)
P.A. [employer] Weight loss meds are plan exclusion

## 2015-09-13 NOTE — Telephone Encounter (Signed)
Barbara from [employer] called and left a message on my voicemail stating a pt "Yolanda Brown" with date of birth 03/03/1961 has not had any claims filed for hypertension treatment yet has been diagnosed with hypertension. Pt is on 2 bp medications per her chart. The call back number was [phone] case # [id number]. Not calling them back after discussing with Vincenza HewsShane, do not feel comfortable discussing pt personal information and treatment option with a company not listed on pts insurance card or in pts chart.

## 2015-10-09 ENCOUNTER — Telehealth: Payer: Self-pay | Admitting: Medical

## 2015-10-09 NOTE — Telephone Encounter (Signed)
Call out med, no refill

## 2015-10-09 NOTE — Telephone Encounter (Signed)
Rcvd refill request for Alprazolam 0.5 mg #45

## 2015-10-10 MED ORDER — ALPRAZOLAM 0.5 MG PO TABS: 0.5000 mg | ORAL_TABLET | Freq: Every evening | ORAL | 0 refills | Status: DC | PRN

## 2015-10-10 NOTE — Telephone Encounter (Signed)
Phoned in.

## 2015-11-13 ENCOUNTER — Other Ambulatory Visit: Payer: Self-pay | Admitting: Medical

## 2015-11-13 ENCOUNTER — Telehealth: Payer: Self-pay | Admitting: Family Medicine

## 2015-11-13 MED ORDER — BUPROPION HCL ER (XL) 300 MG PO TB24: 300.0000 mg | ORAL_TABLET | Freq: Every day | ORAL | 1 refills | Status: DC

## 2015-11-13 NOTE — Progress Notes (Signed)
Yes begin back on medication, f/u 82mo

## 2015-11-13 NOTE — Telephone Encounter (Signed)
Pt called wants to start back on Wellbutrin.  She states she hasn't taken it for 6 - 7 months.   [facility] at [address].  Pt ph [phone]   Please advise pt if you will refill

## 2015-11-14 NOTE — Progress Notes (Signed)
Spoke with pt- notified her of refill, and she states she will CB to schedule f/u next month. 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

## 2015-12-27 ENCOUNTER — Telehealth: Payer: Self-pay | Admitting: Medical

## 2015-12-27 NOTE — Telephone Encounter (Signed)
I got a note from pharmacy that she either is not refilling her BP medication or BP not controlled.   Is she taking her medication, is she monitoring her BPs?  Any concerns?

## 2015-12-27 NOTE — Telephone Encounter (Signed)
Tried to call person is not excepting calls right now

## 2015-12-30 NOTE — Telephone Encounter (Signed)
LMTCB

## 2015-12-31 NOTE — Telephone Encounter (Signed)
Phone not accepting calls at this time. Will mail letter. Trixie Rude/RLB

## 2016-01-06 NOTE — Telephone Encounter (Signed)
This encounter was created in error - please disregard.

## 2016-01-13 ENCOUNTER — Telehealth: Payer: Self-pay | Admitting: Family Medicine

## 2016-01-13 NOTE — Telephone Encounter (Signed)
[facility] refill req Alprazolam .5 mg

## 2016-01-14 NOTE — Telephone Encounter (Signed)
Call this out and get her in for med check f/u visit soon

## 2016-01-15 MED ORDER — ALPRAZOLAM 0.5 MG PO TABS: 0.5000 mg | ORAL_TABLET | Freq: Every evening | ORAL | 0 refills | Status: DC | PRN

## 2016-01-15 NOTE — Telephone Encounter (Signed)
Called in med to pharmacy and left detailed message for pt to call back and schedule appt for med check

## 2016-03-18 ENCOUNTER — Ambulatory Visit: Payer: Self-pay | Admitting: Medical

## 2016-04-22 ENCOUNTER — Other Ambulatory Visit: Payer: Self-pay | Admitting: Medical

## 2016-04-22 NOTE — Telephone Encounter (Signed)
Can this patient have a refill on this ? 

## 2016-04-22 NOTE — Telephone Encounter (Signed)
Call it out 

## 2016-04-23 NOTE — Telephone Encounter (Signed)
Called in the rx  To pharmacy

## 2016-07-06 ENCOUNTER — Other Ambulatory Visit: Payer: Self-pay | Admitting: Medical

## 2016-07-07 NOTE — Telephone Encounter (Signed)
Can he have a refill on this. 

## 2016-09-08 ENCOUNTER — Telehealth: Payer: Self-pay | Admitting: Medical

## 2016-09-08 ENCOUNTER — Other Ambulatory Visit: Payer: Self-pay | Admitting: Medical

## 2016-09-08 NOTE — Telephone Encounter (Signed)
Pt made Med Ck appt for 09/22/16. Requesting Xanax refill to last until appt.

## 2016-09-08 NOTE — Telephone Encounter (Signed)
Pt made Med Ck appt for 09/22/16 need refill to last until appt

## 2016-09-08 NOTE — Telephone Encounter (Signed)
Called and l/m for pt call us back to make an appt. 

## 2016-09-10 ENCOUNTER — Other Ambulatory Visit: Payer: Self-pay

## 2016-09-10 MED ORDER — ALPRAZOLAM 0.5 MG PO TABS: ORAL_TABLET | ORAL | 0 refills | Status: DC

## 2016-09-10 NOTE — Telephone Encounter (Signed)
pls call out 30 day supply, no refill

## 2016-09-10 NOTE — Telephone Encounter (Signed)
Done

## 2016-09-11 ENCOUNTER — Telehealth: Payer: Self-pay | Admitting: Medical

## 2016-09-11 NOTE — Telephone Encounter (Signed)
Called in the pharmacy

## 2016-09-11 NOTE — Telephone Encounter (Signed)
Pt called stating that Xanax should have been sent to [facility]

## 2016-09-15 ENCOUNTER — Emergency Department (HOSPITAL_COMMUNITY)
Admission: EM | Admit: 2016-09-15 | Discharge: 2016-09-15 | Disposition: A | Discharge status: Home / Self Care | Payer: Managed Care, Other (non HMO) | Attending: Emergency Medicine | Admitting: Emergency Medicine

## 2016-09-15 ENCOUNTER — Encounter (HOSPITAL_COMMUNITY): Payer: Self-pay

## 2016-09-15 DIAGNOSIS — T63481A Toxic effect of venom of other arthropod, accidental (unintentional), initial encounter: Secondary | ICD-10-CM

## 2016-09-15 DIAGNOSIS — I10 Essential (primary) hypertension: Secondary | ICD-10-CM | POA: Insufficient documentation

## 2016-09-15 DIAGNOSIS — Z79899 Other long term (current) drug therapy: Secondary | ICD-10-CM | POA: Insufficient documentation

## 2016-09-15 DIAGNOSIS — S80861A Insect bite (nonvenomous), right lower leg, initial encounter: Secondary | ICD-10-CM | POA: Insufficient documentation

## 2016-09-15 DIAGNOSIS — Y999 Unspecified external cause status: Secondary | ICD-10-CM | POA: Insufficient documentation

## 2016-09-15 DIAGNOSIS — Y92129 Unspecified place in nursing home as the place of occurrence of the external cause: Secondary | ICD-10-CM | POA: Insufficient documentation

## 2016-09-15 DIAGNOSIS — Y9389 Activity, other specified: Secondary | ICD-10-CM | POA: Insufficient documentation

## 2016-09-15 DIAGNOSIS — S80862A Insect bite (nonvenomous), left lower leg, initial encounter: Secondary | ICD-10-CM | POA: Insufficient documentation

## 2016-09-15 DIAGNOSIS — W57XXXA Bitten or stung by nonvenomous insect and other nonvenomous arthropods, initial encounter: Secondary | ICD-10-CM | POA: Insufficient documentation

## 2016-09-15 MED ORDER — HYDROCORTISONE 1 % EX CREA: TOPICAL_CREAM | CUTANEOUS | 0 refills | Status: DC

## 2016-09-15 MED ORDER — DIPHENHYDRAMINE HCL 25 MG PO TABS: 25.0000 mg | ORAL_TABLET | Freq: Four times a day (QID) | ORAL | 0 refills | Status: DC

## 2016-09-15 NOTE — Discharge Instructions (Signed)
Read the information below.  Use the prescribed medication as directed.  Please discuss all new medications with your pharmacist.  You may return to the Emergency Department at any time for worsening condition or any new symptoms that concern you.     It appears your are having a localized reaction to insect bites or stings.  Please use cold compresses in addition to the prescribed medications.    If you develop fevers, increased pain, hardened areas of skin, or pus coming from the area, return to the Emergency Department immediately for a recheck.
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
## 2016-09-15 NOTE — ED Provider Notes (Signed)
[facility]-EMERGENCY DEPT Provider Note    By signing my name below, I, Earmon Phoenix, attest that this documentation has been prepared under the direction and in the presence of [facility], PA-C. Electronically Signed: Earmon Phoenix, ED Scribe. 09/15/16. 5:56 PM.    History   Chief Complaint Chief Complaint  Patient presents with  . insect  bites    The history is provided by the patient and medical records. No language interpreter was used.    Yolanda Brown is an obese 56 y.o. female who presents to the Emergency Department complaining of insect bites to posterior bilateral calves that appeared yesterday. Pt states she was visiting someone in a nursing home and felt something biting her. She reports associated swelling, itching and pain of the areas, left worse than right. She has not taken anything for her symptoms. Touching the areas increases the pain. She denies alleviating factors. She denies fever, chills, nausea, vomiting, abdominal pain.   Past Medical History:  Diagnosis Date  . Allergy   . Anxiety 2013   has seen Dr. Evelene Croon prior  . Depression 2013   Dr. Evelene Croon prior  . Diverticulitis 06/2013   3 total episodes  . DVT (deep vein thrombosis) in pregnancy (HCC)   . GERD (gastroesophageal reflux disease)   . Hypertension   . Vitamin D deficiency     Patient Active Problem List   Diagnosis Date Noted  . Obesity 08/06/2015  . Impaired fasting blood sugar 08/06/2015  . Routine general medical examination at a health care facility 08/06/2015  . Vaccine counseling 08/06/2015  . Screening for breast cancer 08/06/2015  . Chronic fatigue 08/06/2015  . Vitamin D deficiency 08/06/2015  . Non-restorative sleep 08/06/2015  . Snoring 08/06/2015  . Daytime somnolence 08/06/2015  . Anxiety and depression 01/25/2015  . History of DVT (deep vein thrombosis) 01/25/2015  . Essential hypertension 01/25/2015    Past Surgical History:  Procedure Laterality Date  . ABDOMINAL  HYSTERECTOMY     fibroids, still has ovaries  . CHOLECYSTECTOMY    . COLONOSCOPY  2011   [place], [place]; polyp and diverticulosis  . WISDOM TOOTH EXTRACTION      OB History    No data available       Home Medications    Prior to Admission medications   Medication Sig Start Date End Date Taking? Authorizing Provider  ALPRAZolam Prudy Feeler) 0.5 MG tablet take 1 TABLET BY MOUTH TWICE DAILY for anxiety 09/11/16   Tysinger, Kermit Balo, PA-C  amLODipine (NORVASC) 5 MG tablet Take 1 tablet (5 mg total) by mouth daily. 09/08/16   Tysinger, Kermit Balo, PA-C  buPROPion (WELLBUTRIN XL) 300 MG 24 hr tablet Take 1 tablet (300 mg total) by mouth daily. 11/13/15   Tysinger, Kermit Balo, PA-C  diphenhydrAMINE (BENADRYL) 25 MG tablet Take 1 tablet (25 mg total) by mouth every 6 (six) hours. X 3 days, then as needed 09/15/16   Chad, Hailee Hollick, PA-C  HYDROcodone-acetaminophen (NORCO/VICODIN) 5-325 MG tablet Take 1 tablet by mouth every 6 (six) hours as needed for moderate pain. Patient not taking: Reported on 07/24/2015 07/17/15   Tysinger, Kermit Balo, PA-C  hydrocortisone cream 1 % Apply to affected area 2 times daily 09/15/16   Trixie Dredge, PA-C  Multiple Vitamin (MULITIVITAMIN WITH MINERALS) TABS Take 1 tablet by mouth every morning.     [provider]  Phentermine-Topiramate (QSYMIA) 7.5-46 MG CP24 Take 1 capsule by mouth every morning. 09/11/15   Tysinger, Kermit Balo, PA-C  triamterene-hydrochlorothiazide (  DYAZIDE) 37.5-25 MG capsule Take 1 tablet by mouth daily. 09/08/16   Tysinger, Kermit Balo, PA-C  Vitamin D, Ergocalciferol, (DRISDOL) 50000 units CAPS capsule Take 1 capsule (50,000 Units total) by mouth every 7 (seven) days. 08/07/15   Tysinger, Kermit Balo, PA-C    Family History Family History  Problem Relation Age of Onset  . Diabetes Mother   . Heart disease Mother   . Cancer Father        prostate, mets  . Diabetes Father   . Hypertension Sister   . Diabetes Sister   . Stroke Sister   . Hypertension Brother   .  Obesity Brother   . Hypertension Brother   . Other Brother        died of sepsis s/p perforated bowel  . Obesity Brother   . Heart disease Sister   . Hypertension Sister   . Diabetes Sister   . Hypertension Other   . Diabetes Other   . Cancer Paternal Uncle        13 uncles all died with cancer, various types    Social History Social History  Substance Use Topics  . Smoking status: Never Smoker  . Smokeless tobacco: Never Used  . Alcohol use 1.2 oz/week    2 Glasses of wine per week     Comment: rare     Allergies   Aspirin   Review of Systems Review of Systems  Constitutional: Negative for chills and fever.  Gastrointestinal: Negative for nausea and vomiting.  Skin: Positive for color change.       Insect bites to bilateral calves     Physical Exam Updated Vital Signs BP (!) 132/94 (BP Location: Left Arm)   Pulse 84   Temp 97.8 F (36.6 C) (Oral)   Resp 16   Ht 5\' 7"  (1.702 m)   Wt 250 lb (113.4 kg)   SpO2 98%   BMI 39.16 kg/m   Physical Exam  Constitutional: She appears well-developed and well-nourished. No distress.  HENT:  Head: Normocephalic and atraumatic.  Neck: Neck supple.  Pulmonary/Chest: Effort normal.  Neurological: She is alert.  Skin: Skin is warm and dry. She is not diaphoretic.  Bilateral posterior calves with multiple grouped papules, left greater than right. Left calf with localized edema, erythema and tenderness in the area of the most dense bites. No fluctuance or induration.  Nursing note and vitals reviewed.    ED Treatments / Results  DIAGNOSTIC STUDIES: Oxygen Saturation is 98% on RA, normal by my interpretation.   COORDINATION OF CARE: 5:49 PM- Will treat symptomatically. Pt verbalizes understanding and agrees to plan.  Medications - No data to display  Labs (all labs ordered are listed, but only abnormal results are displayed) Labs Reviewed - No data to display  EKG  EKG Interpretation None        Radiology No results found.  Procedures Procedures (including critical care time)  Medications Ordered in ED Medications - No data to display   Initial Impression / Assessment and Plan / ED Course  I have reviewed the triage vital signs and the nursing notes.  Pertinent labs & imaging results that were available during my care of the patient were reviewed by me and considered in my medical decision making (see chart for details).     Afebrile, nontoxic patient with insect bites to the bilateral posterior calves that occurred yesterday.  Does not clinically appear to be scabies.  Pt is having localized reaction around the  densest area of bites on her left calf.  Doubt infection, abscess, cellulitis.   D/C home with benadryl, hydrocortisone cream, PCP follow up PRN.  Discussed result, findings, treatment, and follow up  with patient.  Pt given return precautions.  Pt verbalizes understanding and agrees with plan.       Final Clinical Impressions(s) / ED Diagnoses   Final diagnoses:  Insect bites and stings, accidental or unintentional, initial encounter  Local reaction to insect sting, accidental or unintentional, initial encounter    New Prescriptions New Prescriptions   DIPHENHYDRAMINE (BENADRYL) 25 MG TABLET    Take 1 tablet (25 mg total) by mouth every 6 (six) hours. X 3 days, then as needed   HYDROCORTISONE CREAM 1 %    Apply to affected area 2 times daily    I personally performed the services described in this documentation, which was scribed in my presence. The recorded information has been reviewed and is accurate.      Trixie DredgeWest, Taeko Schaffer, PA-C 09/15/16 1930    Rolan BuccoBelfi, Melanie, MD 09/15/16 2300

## 2016-09-22 ENCOUNTER — Encounter: Payer: Self-pay | Admitting: Medical

## 2016-09-22 NOTE — Telephone Encounter (Signed)
Call patient, send no show fee.   I think she has hx/o no shows or cancellations.  It may be time to dismiss.

## 2016-09-22 NOTE — Telephone Encounter (Signed)

## 2016-09-24 ENCOUNTER — Encounter: Payer: Self-pay | Admitting: Medical

## 2016-09-24 NOTE — Telephone Encounter (Signed)
Pt no showed in 2015 and has not sense then. Only cancelled appt pt has is in January. That one was made and cancelled on the same day. She could not find a ride. No show letter for recent one will be sent.

## 2016-11-05 ENCOUNTER — Ambulatory Visit (HOSPITAL_COMMUNITY)
Admission: EM | Admit: 2016-11-05 | Discharge: 2016-11-05 | Disposition: A | Discharge status: Home / Self Care | Payer: Self-pay | Attending: Family Medicine | Admitting: Family Medicine

## 2016-11-05 ENCOUNTER — Encounter (HOSPITAL_COMMUNITY): Payer: Self-pay | Admitting: Nurse Practitioner

## 2016-11-05 DIAGNOSIS — R059 Cough, unspecified: Secondary | ICD-10-CM

## 2016-11-05 DIAGNOSIS — J00 Acute nasopharyngitis [common cold]: Secondary | ICD-10-CM

## 2016-11-05 DIAGNOSIS — J40 Bronchitis, not specified as acute or chronic: Secondary | ICD-10-CM

## 2016-11-05 DIAGNOSIS — R05 Cough: Secondary | ICD-10-CM

## 2016-11-05 MED ORDER — AZITHROMYCIN 250 MG PO TABS
250.0000 mg | ORAL_TABLET | Freq: Every day | ORAL | 0 refills | Status: DC
Start: 2016-11-05 — End: 2016-11-23

## 2016-11-05 MED ORDER — BENZONATATE 100 MG PO CAPS: 200.0000 mg | ORAL_CAPSULE | Freq: Three times a day (TID) | ORAL | 0 refills | Status: DC | PRN

## 2016-11-05 MED ORDER — IPRATROPIUM BROMIDE 0.06 % NA SOLN
2.0000 | Freq: Four times a day (QID) | NASAL | 0 refills | Status: DC
Start: 2016-11-05 — End: 2017-04-23

## 2016-11-05 NOTE — ED Triage Notes (Signed)
Pt presents with URI. Her symptoms began on Monday. She reports chills, head and chest congestion, runny nose, sore throat, cough. She has tried delsym, sudafed, alkaseltzer plus at home with no improvement

## 2016-11-05 NOTE — ED Provider Notes (Signed)
[facility]   [id number] 11/05/16 Arrival Time: 1008  ASSESSMENT & PLAN:  1. Acute nasopharyngitis   2. Cough   3. Bronchitis     Meds ordered this encounter  Medications  . azithromycin (ZITHROMAX) 250 MG tablet    Sig: Take 1 tablet (250 mg total) by mouth daily. Take first 2 tablets together, then 1 every day until finished.    Dispense:  6 tablet    Refill:  0    Order Specific Question:   Supervising Provider    Answer:   Mardella LaymanHAGLER, BRIAN [id number]]  . ipratropium (ATROVENT) 0.06 % nasal spray    Sig: Place 2 sprays into both nostrils 4 (four) times daily.    Dispense:  15 mL    Refill:  0    Order Specific Question:   Supervising Provider    Answer:   Mardella LaymanHAGLER, BRIAN [id number]]  . benzonatate (TESSALON) 100 MG capsule    Sig: Take 2 capsules (200 mg total) by mouth 3 (three) times daily as needed for cough.    Dispense:  30 capsule    Refill:  0    Order Specific Question:   Supervising Provider    Answer:   Mardella LaymanHAGLER, BRIAN [[id number]]    Reviewed expectations re: course of current medical issues. Questions answered. Outlined signs and symptoms indicating need for more acute intervention. Patient verbalized understanding. After Visit Summary given.   SUBJECTIVE:  Stephania FragminFaith B Stobaugh is a 56 y.o. female who presents with complaint of   ROS: As per HPI.   OBJECTIVE:  Vitals:   11/05/16 1138  BP: (!) 140/91  Pulse: 90  Resp: 16  Temp: 98.9 F (37.2 C)  TempSrc: Oral  SpO2: 97%    General appearance: alert; no distress Eyes: PERRLA; EOMI; conjunctiva normal HENT: normocephalic; atraumatic; TMs normal; nasal mucosa normal; oral mucosa normal Neck: supple Lungs: clear to auscultation bilaterally Heart: regular rate and rhythm Abdomen: soft, non-tender; bowel sounds normal; no masses or organomegaly; no guarding or rebound tenderness Back: no CVA tenderness Extremities: no cyanosis or edema; symmetrical with no gross deformities Skin: warm and  dry Neurologic: normal gait; normal symmetric reflexes Psychological: alert and cooperative; normal mood and affect  Labs:  Labs Reviewed - No data to display  Imaging: No results found.  Allergies  Allergen Reactions  . Aspirin Hives    Past Medical History:  Diagnosis Date  . Allergy   . Anxiety 2013   has seen Dr. Evelene CroonKaur prior  . Depression 2013   Dr. Evelene CroonKaur prior  . Diverticulitis 06/2013   3 total episodes  . DVT (deep vein thrombosis) in pregnancy (HCC)   . GERD (gastroesophageal reflux disease)   . Hypertension   . Vitamin D deficiency    Social History   Social History  . Marital status: Married    Spouse name: N/A  . Number of children: N/A  . Years of education: N/A   Occupational History  . Not on file.   Social History Main Topics  . Smoking status: Never Smoker  . Smokeless tobacco: Never Used  . Alcohol use 1.2 oz/week    2 Glasses of wine per week     Comment: rare  . Drug use: No  . Sexual activity: Not on file   Other Topics Concern  . Not on file   Social History Narrative   [place] 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 Diabetes Mother   . Heart disease Mother   . Cancer Father        prostate, mets  . Diabetes Father   . Hypertension Sister   . Diabetes Sister   . Stroke Sister   . Hypertension Brother   . Obesity Brother   . Hypertension Brother   . Other Brother        died of sepsis s/p perforated bowel  . Obesity Brother   . Heart disease Sister   . Hypertension Sister   . Diabetes Sister   . Hypertension Other   . Diabetes Other   . Cancer Paternal Uncle        13 uncles all died with cancer, various types   Past Surgical History:  Procedure Laterality Date  . ABDOMINAL HYSTERECTOMY     fibroids, still has ovaries  . CHOLECYSTECTOMY    . COLONOSCOPY  2011   [place], [place]; polyp and diverticulosis  . WISDOM TOOTH EXTRACTION        Deatra Canter, FNP 11/05/16 1236

## 2016-11-13 ENCOUNTER — Telehealth: Payer: Self-pay | Admitting: Medical

## 2016-11-13 ENCOUNTER — Other Ambulatory Visit: Payer: Self-pay | Admitting: Medical

## 2016-11-13 NOTE — Telephone Encounter (Signed)
pls check on her no show/cancellation status.  She is calling in for refill, however, last visit >1 year ago, and she has had 2 recent NON emergency ED visit.   She should have come here for non emergency visits, and I can't recal if she is on the delinquency list or not?

## 2016-11-13 NOTE — Telephone Encounter (Signed)
Pt made Med Ck appointment for 11/23/16 when she will be back in town from work. She wants to know if she can pick up Xananx and Amlodipine this weekend while [facility] since she has been without meds for months

## 2016-11-13 NOTE — Telephone Encounter (Signed)
Pt called and made an appt for 11/23/2016 sent refill for amlodipine #30 and xanax  #20 to last her until her appt on 11/23/2016.

## 2016-11-13 NOTE — Telephone Encounter (Signed)
Can this pt have a refill on this , on her last appt she no showed ?

## 2016-11-14 ENCOUNTER — Other Ambulatory Visit: Payer: Self-pay | Admitting: Medical

## 2016-11-23 ENCOUNTER — Encounter (HOSPITAL_COMMUNITY): Payer: Self-pay | Admitting: Emergency Medicine

## 2016-11-23 ENCOUNTER — Encounter: Payer: Self-pay | Admitting: Medical

## 2016-11-23 ENCOUNTER — Ambulatory Visit (HOSPITAL_COMMUNITY)
Admission: EM | Admit: 2016-11-23 | Discharge: 2016-11-23 | Disposition: A | Discharge status: Home / Self Care | Payer: Commercial Managed Care - PPO | Attending: Urgent Care | Admitting: Urgent Care

## 2016-11-23 DIAGNOSIS — Z8719 Personal history of other diseases of the digestive system: Secondary | ICD-10-CM

## 2016-11-23 DIAGNOSIS — R1032 Left lower quadrant pain: Secondary | ICD-10-CM

## 2016-11-23 MED ORDER — ONDANSETRON 8 MG PO TBDP: 8.0000 mg | ORAL_TABLET | Freq: Three times a day (TID) | ORAL | 0 refills | Status: DC | PRN

## 2016-11-23 MED ORDER — AMOXICILLIN-POT CLAVULANATE 875-125 MG PO TABS: 1.0000 | ORAL_TABLET | Freq: Three times a day (TID) | ORAL | 0 refills | Status: DC

## 2016-11-23 NOTE — ED Provider Notes (Signed)
MRN: [id number] DOB: 03/11/1960  Subjective:   Yolanda Brown is a 56 y.o. female with pmh of diverticulitis presenting for chief complaint of Abdominal Pain  Reports 1 day history of gnawing, constant, lower abdominal pain, L>R. Has also had mild nausea without vomiting, loose stools yesterday but is improved today. Has a history of diverticulitis, her abdominal pain today is very similar to her previous belly pain when she had diverticulitis. Denies fever, bloody stools, dysuria, hematuria, urinary frequency. She has a GI doctor in [place], [place], has not seen him in several months, is due for colonoscopy.  No current facility-administered medications for this encounter.   Current Outpatient Prescriptions:  .  ALPRAZolam (XANAX) 0.5 MG tablet, take 1 TABLET BY MOUTH TWICE DAILY AS NEEDED for anxiety, Disp: 20 tablet, Rfl: 0 .  amLODipine (NORVASC) 5 MG tablet, Take 1 tablet (5 mg total) by mouth daily., Disp: 30 tablet, Rfl: 0 .  amoxicillin-clavulanate (AUGMENTIN) 875-125 MG tablet, Take 1 tablet by mouth 3 (three) times daily with meals., Disp: 21 tablet, Rfl: 0 .  benzonatate (TESSALON) 100 MG capsule, Take 2 capsules (200 mg total) by mouth 3 (three) times daily as needed for cough., Disp: 30 capsule, Rfl: 0 .  buPROPion (WELLBUTRIN XL) 300 MG 24 hr tablet, Take 1 tablet (300 mg total) by mouth daily., Disp: 30 tablet, Rfl: 1 .  diphenhydrAMINE (BENADRYL) 25 MG tablet, Take 1 tablet (25 mg total) by mouth every 6 (six) hours. X 3 days, then as needed, Disp: 20 tablet, Rfl: 0 .  HYDROcodone-acetaminophen (NORCO/VICODIN) 5-325 MG tablet, Take 1 tablet by mouth every 6 (six) hours as needed for moderate pain. (Patient not taking: Reported on 07/24/2015), Disp: 20 tablet, Rfl: 0 .  hydrocortisone cream 1 %, Apply to affected area 2 times daily, Disp: 15 g, Rfl: 0 .  ipratropium (ATROVENT) 0.06 % nasal spray, Place 2 sprays into both nostrils 4 (four) times daily., Disp: 15 mL, Rfl: 0 .  Multiple  Vitamin (MULITIVITAMIN WITH MINERALS) TABS, Take 1 tablet by mouth every morning. , Disp: , Rfl:  .  ondansetron (ZOFRAN-ODT) 8 MG disintegrating tablet, Take 1 tablet (8 mg total) by mouth every 8 (eight) hours as needed for nausea., Disp: 15 tablet, Rfl: 0 .  Phentermine-Topiramate (QSYMIA) 7.5-46 MG CP24, Take 1 capsule by mouth every morning., Disp: 30 capsule, Rfl: 0 .  triamterene-hydrochlorothiazide (MAXZIDE-25) 37.5-25 MG tablet, take 1 TABLET BY MOUTH EVERY DAY, Disp: 30 tablet, Rfl: 0 .  Vitamin D, Ergocalciferol, (DRISDOL) 50000 units CAPS capsule, Take 1 capsule (50,000 Units total) by mouth every 7 (seven) days., Disp: 4.5 capsule, Rfl: 3   Yolanda Brown is allergic to aspirin.  Yolanda Brown  has a past medical history of Allergy; Anxiety (2013); Depression (2013); Diverticulitis (06/2013); DVT (deep vein thrombosis) in pregnancy [facility]); GERD (gastroesophageal reflux disease); Hypertension; and Vitamin D deficiency. Also  has a past surgical history that includes Cholecystectomy; Colonoscopy (2011); Wisdom tooth extraction; and Abdominal hysterectomy.  Objective:   Vitals: BP 113/73   Pulse 91   Temp 98.3 F (36.8 C) (Oral)   Resp 16   Ht  (1.702 m)   Wt 262 lb (118.8 kg)   SpO2 94%   BMI 41.04 kg/m   Physical Exam  Constitutional: She is oriented to person, place, and time. She appears well-developed and well-nourished. She appears distressed (secondary to her belly pain).  HENT:  Mouth/Throat: Oropharynx is clear and moist.  Eyes: No scleral icterus.  Cardiovascular: Normal rate,  regular rhythm and intact distal pulses.  Exam reveals no gallop and no friction rub.   No murmur heard. Pulmonary/Chest: No respiratory distress. She has no wheezes. She has no rales.  Abdominal: Soft. Bowel sounds are normal. She exhibits no distension and no mass. There is tenderness (LLQ>RLQ). There is no rebound and no guarding.  Neurological: She is alert and oriented to person, place, and time.   Skin: Skin is warm and dry. She is not diaphoretic.   Assessment and Plan :   Left lower quadrant pain  History of diverticulitis   Start empiric treatment for diverticulitis with Augmentin. Counseled patient on potential for adverse effects with medications prescribed today, patient verbalized understanding. Return-to-clinic precautions discussed, patient verbalized understanding.   Wallis Bamberg, PA-C [facility]  11/23/2016  4:51 PM    Wallis Bamberg, PA-C 11/23/16 1712

## 2016-11-23 NOTE — ED Triage Notes (Signed)
PT reports severe cramping lower abdominal pain with nausea. PT reports relief after passing gas. PT has history of diverticulitis and reports this feels similar.

## 2016-11-28 ENCOUNTER — Encounter (HOSPITAL_COMMUNITY): Payer: Self-pay

## 2016-11-28 ENCOUNTER — Emergency Department (HOSPITAL_COMMUNITY)
Admission: EM | Admit: 2016-11-28 | Discharge: 2016-11-28 | Disposition: A | Discharge status: Home / Self Care | Payer: Commercial Managed Care - PPO | Attending: Emergency Medicine | Admitting: Emergency Medicine

## 2016-11-28 ENCOUNTER — Emergency Department (HOSPITAL_COMMUNITY): Payer: Commercial Managed Care - PPO

## 2016-11-28 DIAGNOSIS — Y92009 Unspecified place in unspecified non-institutional (private) residence as the place of occurrence of the external cause: Secondary | ICD-10-CM | POA: Diagnosis not present

## 2016-11-28 DIAGNOSIS — S060X1A Concussion with loss of consciousness of 30 minutes or less, initial encounter: Secondary | ICD-10-CM | POA: Diagnosis not present

## 2016-11-28 DIAGNOSIS — W01198A Fall on same level from slipping, tripping and stumbling with subsequent striking against other object, initial encounter: Secondary | ICD-10-CM | POA: Insufficient documentation

## 2016-11-28 DIAGNOSIS — Z79899 Other long term (current) drug therapy: Secondary | ICD-10-CM | POA: Diagnosis not present

## 2016-11-28 DIAGNOSIS — R51 Headache: Secondary | ICD-10-CM | POA: Diagnosis present

## 2016-11-28 DIAGNOSIS — Y999 Unspecified external cause status: Secondary | ICD-10-CM | POA: Insufficient documentation

## 2016-11-28 DIAGNOSIS — I10 Essential (primary) hypertension: Secondary | ICD-10-CM | POA: Diagnosis not present

## 2016-11-28 DIAGNOSIS — Y9389 Activity, other specified: Secondary | ICD-10-CM | POA: Insufficient documentation

## 2016-11-28 NOTE — ED Notes (Signed)
Bed: WA10 Expected date:  Expected time:  Means of arrival:  Comments: 

## 2016-11-28 NOTE — ED Triage Notes (Signed)
Pt c/o headache and abdominal pain.  Pt seen recently for the abdominal pain at urgent care a week ago.  Pt told to take tylenol and was not given diagnosis.  Hx of diverticulitis.  Pt given script for zofran but could not afford.  Pt also fell a week ago.  States she passed out. Has had headache since then.

## 2016-11-28 NOTE — ED Notes (Signed)
Patient left prior to receiving discharge instructions. 

## 2016-11-28 NOTE — ED Notes (Signed)
Patient left with family member after MD said she would be discharged

## 2016-11-28 NOTE — ED Provider Notes (Addendum)
[facility]-EMERGENCY DEPT Provider Note   CSN: [id number] Arrival date & time: 11/28/16  7829     History   Chief Complaint Chief Complaint  Patient presents with  . Abdominal Pain  . Headache    HPI Yolanda Brown is a 56 y.o. female.Patient reports that she was coughing last week and during a coughing episode she became unconscious, fell striking her head at left parietal area against a spacer heater. She's had intermittent headaches since the event accompanied by nausea. She was seen at urgent care center on 11/23/2016 for abdominal pain similar to diverticulitis that she's had in the past, treated presumptively for diverticulitis with Augmentin. Abdominal pain has resolved. She continues to complain of nausea. Cough has resolved. Her complaint presently is headache which waxes and wanes at left parietal area coming by nausea. No vomiting no fever last bowel movement yesterday, normal no urinary symptoms no shortness of breath no chest pain. She was prescribed Zofran for nausea which she did not fill due to cost. Nothing makes symptoms better or worse. No other associated symptoms  HPI  Past Medical History:  Diagnosis Date  . Allergy   . Anxiety 2013   has seen Dr. Evelene Croon prior  . Depression 2013   Dr. Evelene Croon prior  . Diverticulitis 06/2013   3 total episodes  . DVT (deep vein thrombosis) in pregnancy (HCC)   . GERD (gastroesophageal reflux disease)   . Hypertension   . Vitamin D deficiency     Patient Active Problem List   Diagnosis Date Noted  . Obesity 08/06/2015  . Impaired fasting blood sugar 08/06/2015  . Routine general medical examination at a health care facility 08/06/2015  . Vaccine counseling 08/06/2015  . Screening for breast cancer 08/06/2015  . Chronic fatigue 08/06/2015  . Vitamin D deficiency 08/06/2015  . Non-restorative sleep 08/06/2015  . Snoring 08/06/2015  . Daytime somnolence 08/06/2015  . Anxiety and depression 01/25/2015  . History of DVT (deep vein  thrombosis) 01/25/2015  . Essential hypertension 01/25/2015    Past Surgical History:  Procedure Laterality Date  . ABDOMINAL HYSTERECTOMY     fibroids, still has ovaries  . CHOLECYSTECTOMY    . COLONOSCOPY  2011   [place], [place]; polyp and diverticulosis  . WISDOM TOOTH EXTRACTION      OB History    No data available       Home Medications    Prior to Admission medications   Medication Sig Start Date End Date Taking? Authorizing Provider  ALPRAZolam Prudy Feeler) 0.5 MG tablet take 1 TABLET BY MOUTH TWICE DAILY AS NEEDED for anxiety 11/13/16   Tysinger, Kermit Balo, PA-C  amLODipine (NORVASC) 5 MG tablet Take 1 tablet (5 mg total) by mouth daily. 11/13/16   Tysinger, Kermit Balo, PA-C  amoxicillin-clavulanate (AUGMENTIN) 875-125 MG tablet Take 1 tablet by mouth 3 (three) times daily with meals. 11/23/16   Wallis Bamberg, PA-C  benzonatate (TESSALON) 100 MG capsule Take 2 capsules (200 mg total) by mouth 3 (three) times daily as needed for cough. 11/05/16   Deatra Canter, FNP  buPROPion (WELLBUTRIN XL) 300 MG 24 hr tablet Take 1 tablet (300 mg total) by mouth daily. 11/13/15   Tysinger, Kermit Balo, PA-C  diphenhydrAMINE (BENADRYL) 25 MG tablet Take 1 tablet (25 mg total) by mouth every 6 (six) hours. X 3 days, then as needed 09/15/16   Chad, Emily, PA-C  HYDROcodone-acetaminophen (NORCO/VICODIN) 5-325 MG tablet Take 1 tablet by mouth every 6 (six) hours as needed  for moderate pain. Patient not taking: Reported on 07/24/2015 07/17/15   Tysinger, Kermit Balo, PA-C  hydrocortisone cream 1 % Apply to affected area 2 times daily 09/15/16   Chad, Emily, PA-C  ipratropium (ATROVENT) 0.06 % nasal spray Place 2 sprays into both nostrils 4 (four) times daily. 11/05/16   Deatra Canter, FNP  Multiple Vitamin (MULITIVITAMIN WITH MINERALS) TABS Take 1 tablet by mouth every morning.     [provider]  ondansetron (ZOFRAN-ODT) 8 MG disintegrating tablet Take 1 tablet (8 mg total) by mouth every 8 (eight) hours as  needed for nausea. 11/23/16   Wallis Bamberg, PA-C  Phentermine-Topiramate (QSYMIA) 7.5-46 MG CP24 Take 1 capsule by mouth every morning. 09/11/15   Tysinger, Kermit Balo, PA-C  triamterene-hydrochlorothiazide (MAXZIDE-25) 37.5-25 MG tablet take 1 TABLET BY MOUTH EVERY DAY 11/16/16   Tysinger, Kermit Balo, PA-C  Vitamin D, Ergocalciferol, (DRISDOL) 50000 units CAPS capsule Take 1 capsule (50,000 Units total) by mouth every 7 (seven) days. 08/07/15   Tysinger, Kermit Balo, PA-C    Family History Family History  Problem Relation Age of Onset  . Diabetes Mother   . Heart disease Mother   . Cancer Father        prostate, mets  . Diabetes Father   . Hypertension Sister   . Diabetes Sister   . Stroke Sister   . Hypertension Brother   . Obesity Brother   . Hypertension Brother   . Other Brother        died of sepsis s/p perforated bowel  . Obesity Brother   . Heart disease Sister   . Hypertension Sister   . Diabetes Sister   . Hypertension Other   . Diabetes Other   . Cancer Paternal Uncle        13 uncles all died with cancer, various types    Social History Social History  Substance Use Topics  . Smoking status: Never Smoker  . Smokeless tobacco: Never Used  . Alcohol use 1.2 oz/week    2 Glasses of wine per week     Comment: rare     Allergies   Aspirin   Review of Systems Review of Systems  Constitutional: Negative.   HENT: Negative.   Respiratory: Negative.   Cardiovascular: Negative.   Gastrointestinal: Positive for nausea.  Musculoskeletal: Negative.   Skin: Negative.   Neurological: Positive for headaches.  Psychiatric/Behavioral: Negative.   All other systems reviewed and are negative.    Physical Exam Updated Vital Signs BP 130/82 (BP Location: Left Arm)   Pulse 93   Temp 98.3 F (36.8 C) (Oral)   Resp 16   SpO2 98%   Physical Exam  Constitutional: She is oriented to person, place, and time. She appears well-developed and well-nourished.  HENT:  Head:  Normocephalic and atraumatic.  Eyes: Pupils are equal, round, and reactive to light. Conjunctivae are normal.  Neck: Neck supple. No tracheal deviation present. No thyromegaly present.  Cardiovascular: Normal rate and regular rhythm.   No murmur heard. Pulmonary/Chest: Effort normal and breath sounds normal.  Abdominal: Soft. Bowel sounds are normal. She exhibits no distension. There is no tenderness.  Obese  Musculoskeletal: Normal range of motion. She exhibits no edema or tenderness.  Neurological: She is alert and oriented to person, place, and time. No cranial nerve deficit. Coordination normal.  Gait normal Romberg normal pronator drift normal DTR symmetric bilaterally at knee jerk ankle jerk biceps historical and bilaterally  Skin: Skin is warm and  dry. No rash noted.  Psychiatric: She has a normal mood and affect.  Nursing note and vitals reviewed.    ED Treatments / Results  Labs (all labs ordered are listed, but only abnormal results are displayed) Labs Reviewed - No data to display  EKG  EKG Interpretation None      Results for orders placed or performed in visit on 08/06/15  Comprehensive metabolic panel  Result Value Ref Range   Sodium 141 135 - 146 mmol/L   Potassium 4.1 3.5 - 5.3 mmol/L   Chloride 104 98 - 110 mmol/L   CO2 28 20 - 31 mmol/L   Glucose, Bld 103 (H) 65 - 99 mg/dL   BUN 12 7 - 25 mg/dL   Creat 1.61 0.96 - 0.45 mg/dL   Total Bilirubin 0.4 0.2 - 1.2 mg/dL   Alkaline Phosphatase 67 33 - 130 U/L   AST 25 10 - 35 U/L   ALT 24 6 - 29 U/L   Total Protein 7.5 6.1 - 8.1 g/dL   Albumin 3.9 3.6 - 5.1 g/dL   Calcium 9.3 8.6 - 40.9 mg/dL  Lipid panel  Result Value Ref Range   Cholesterol 175 125 - 200 mg/dL   Triglycerides 811 (H) <150 mg/dL   HDL 37 (L) >=91 mg/dL   Total CHOL/HDL Ratio 4.7 <=5.0 Ratio   VLDL 39 (H) <30 mg/dL   LDL Cholesterol 99 <478 mg/dL  CBC  Result Value Ref Range   WBC 5.9 4.0 - 10.5 K/uL   RBC 5.15 (H) 3.80 - 5.10 MIL/uL     Hemoglobin 13.8 11.7 - 15.5 g/dL   HCT 29.5 62.1 - 30.8 %   MCV 81.4 80.0 - 100.0 fL   MCH 26.8 (L) 27.0 - 33.0 pg   MCHC 32.9 32.0 - 36.0 g/dL   RDW 65.7 (H) 84.6 - 96.2 %   Platelets 321 140 - 400 K/uL   MPV 10.8 7.5 - 12.5 fL  Hemoglobin A1c  Result Value Ref Range   Hgb A1c MFr Bld 6.6 (H) <5.7 %   Mean Plasma Glucose 143 mg/dL  Microalbumin / creatinine urine ratio  Result Value Ref Range   Creatinine, Urine 284 20 - 320 mg/dL   Microalb, Ur 9.1 Not estab mg/dL   Microalb Creat Ratio 32 (H) <30 mcg/mg creat  VITAMIN D 25 Hydroxy (Vit-D Deficiency, Fractures)  Result Value Ref Range   Vit D, 25-Hydroxy 29 (L) 30 - 100 ng/mL  TSH  Result Value Ref Range   TSH 0.74 mIU/L  POCT urinalysis dipstick  Result Value Ref Range   Color, UA yellow    Clarity, UA clear    Glucose, UA n    Bilirubin, UA n    Ketones, UA n    Spec Grav, UA >=1.030    Blood, UA n    pH, UA 6.0    Protein, UA +    Urobilinogen, UA negative    Nitrite, UA n    Leukocytes, UA Negative Negative   Ct Head Wo Contrast  Result Date: 11/28/2016 CLINICAL DATA:  Headaches since fall 1 week ago with syncope. Hit posterior and left lateral head. EXAM: CT HEAD WITHOUT CONTRAST TECHNIQUE: Contiguous axial images were obtained from the base of the skull through the vertex without intravenous contrast. COMPARISON:  None. FINDINGS: Brain: Ventricles are normal in size and configuration. All areas of the brain demonstrate normal gray-white matter attenuation. There is no mass, hemorrhage, edema or other evidence of  acute parenchymal abnormality. No extra-axial hemorrhage. Vascular: No hyperdense vessel or unexpected calcification. Skull: Normal. Negative for fracture or focal lesion. Sinuses/Orbits: No acute finding. Other: None IMPRESSION: Normal head CT. No intracranial mass, hemorrhage or edema. No skull fracture. Electronically Signed   By: Bary Richard M.D.   On: 11/28/2016 09:46    Radiology No results  found.  Procedures Procedures (including critical care time)  Medications Ordered in ED Medications - No data to display   Initial Impression / Assessment and Plan / ED Course  I have reviewed the triage vital signs and the nursing notes.  Pertinent labs & imaging results that were available during my care of the patient were reviewed by me and considered in my medical decision making (see chart for details).     Patient declined antiemetic medication or pain medication here. I offered to write prescription for Reglan which he declined. I don't feel that imaging of her abdomen or further diagnostic testing is warranted, as abdominal pain has resolved. She is in agreement. Syncopal event likely vasovagal is secondary to cough. Cough has resolved. She likely has mild concussive symptoms as result of striking head. Plan follow-up with PMD as needed  Final Clinical Impressions(s) / ED Diagnoses  Diagnosis #1 concussion Final diagnoses:  None   #2 abdominal pain-resolved New Prescriptions New Prescriptions   No medications on file     Doug Sou, MD 11/28/16 1001    Doug Sou, MD 11/28/16 1002    Doug Sou, MD 11/28/16 1011

## 2016-11-28 NOTE — Discharge Instructions (Signed)
Return if concern for any reason or see your primary care physician. CT scan of the brain today was normal

## 2017-01-20 DIAGNOSIS — S5001XA Contusion of right elbow, initial encounter: Secondary | ICD-10-CM | POA: Insufficient documentation

## 2017-01-21 ENCOUNTER — Encounter: Payer: Self-pay | Admitting: Medical

## 2017-02-11 ENCOUNTER — Encounter: Payer: Self-pay | Admitting: Family Medicine

## 2017-03-30 ENCOUNTER — Other Ambulatory Visit: Payer: Self-pay | Admitting: *Deleted

## 2017-03-30 DIAGNOSIS — Z1231 Encounter for screening mammogram for malignant neoplasm of breast: Secondary | ICD-10-CM

## 2017-04-23 ENCOUNTER — Emergency Department (HOSPITAL_COMMUNITY): Payer: Commercial Managed Care - PPO

## 2017-04-23 ENCOUNTER — Encounter (HOSPITAL_COMMUNITY): Payer: Self-pay | Admitting: Emergency Medicine

## 2017-04-23 ENCOUNTER — Other Ambulatory Visit: Payer: Self-pay

## 2017-04-23 ENCOUNTER — Emergency Department (HOSPITAL_BASED_OUTPATIENT_CLINIC_OR_DEPARTMENT_OTHER)
Admit: 2017-04-23 | Discharge: 2017-04-23 | Disposition: A | Discharge status: Home / Self Care | Payer: Commercial Managed Care - PPO | Attending: Emergency Medicine | Admitting: Emergency Medicine

## 2017-04-23 ENCOUNTER — Emergency Department (HOSPITAL_COMMUNITY)
Admission: EM | Admit: 2017-04-23 | Discharge: 2017-04-23 | Disposition: A | Discharge status: Home / Self Care | Payer: Commercial Managed Care - PPO | Attending: Emergency Medicine | Admitting: Emergency Medicine

## 2017-04-23 DIAGNOSIS — M7989 Other specified soft tissue disorders: Secondary | ICD-10-CM | POA: Diagnosis not present

## 2017-04-23 DIAGNOSIS — Z86718 Personal history of other venous thrombosis and embolism: Secondary | ICD-10-CM | POA: Insufficient documentation

## 2017-04-23 DIAGNOSIS — R0600 Dyspnea, unspecified: Secondary | ICD-10-CM | POA: Insufficient documentation

## 2017-04-23 DIAGNOSIS — Z7901 Long term (current) use of anticoagulants: Secondary | ICD-10-CM | POA: Diagnosis not present

## 2017-04-23 DIAGNOSIS — Z79899 Other long term (current) drug therapy: Secondary | ICD-10-CM | POA: Insufficient documentation

## 2017-04-23 DIAGNOSIS — M79609 Pain in unspecified limb: Secondary | ICD-10-CM | POA: Diagnosis not present

## 2017-04-23 DIAGNOSIS — I10 Essential (primary) hypertension: Secondary | ICD-10-CM | POA: Insufficient documentation

## 2017-04-23 DIAGNOSIS — M79604 Pain in right leg: Secondary | ICD-10-CM | POA: Insufficient documentation

## 2017-04-23 LAB — COMPREHENSIVE METABOLIC PANEL
ALT: 25 U/L (ref 14–54)
AST: 32 U/L (ref 15–41)
Albumin: 3.5 g/dL (ref 3.5–5.0)
Alkaline Phosphatase: 78 U/L (ref 38–126)
Anion gap: 13 (ref 5–15)
BUN: 9 mg/dL (ref 6–20)
CO2: 22 mmol/L (ref 22–32)
Calcium: 8.9 mg/dL (ref 8.9–10.3)
Chloride: 103 mmol/L (ref 101–111)
Creatinine, Ser: 0.88 mg/dL (ref 0.44–1.00)
GFR calc Af Amer: >60 mL/min (ref >60)
GFR calc non Af Amer: >60 mL/min (ref >60)
Glucose, Bld: 118 mg/dL — ABNORMAL HIGH (ref 65–99)
Potassium: 3.7 mmol/L (ref 3.5–5.1)
Sodium: 138 mmol/L (ref 135–145)
Total Bilirubin: 0.3 mg/dL (ref 0.3–1.2)
Total Protein: 7.6 g/dL (ref 6.5–8.1)

## 2017-04-23 LAB — CBC
HCT: 43.9 % (ref 36.0–46.0)
Hemoglobin: 14.1 g/dL (ref 12.0–15.0)
MCH: 26.9 pg (ref 26.0–34.0)
MCHC: 32.1 g/dL (ref 30.0–36.0)
MCV: 83.8 fL (ref 78.0–100.0)
Platelets: 312 10*3/uL (ref 150–400)
RBC: 5.24 MIL/uL — ABNORMAL HIGH (ref 3.87–5.11)
RDW: 15.5 % (ref 11.5–15.5)
WBC: 9.4 10*3/uL (ref 4.0–10.5)

## 2017-04-23 LAB — D-DIMER, QUANTITATIVE: D-Dimer, Quant: <0.27 ug/mL-FEU (ref 0.00–0.50)

## 2017-04-23 MED ORDER — IOPAMIDOL (ISOVUE-370) INJECTION 76%
INTRAVENOUS | Status: AC
  Administered 2017-04-23: 100 mL
  Filled 2017-04-23: qty 100

## 2017-04-23 MED ORDER — OXYCODONE-ACETAMINOPHEN 5-325 MG PO TABS
1.0000 | ORAL_TABLET | Freq: Once | ORAL | Status: AC
  Administered 2017-04-23: 1 via ORAL
  Filled 2017-04-23: qty 1

## 2017-04-23 MED ORDER — CYCLOBENZAPRINE HCL 10 MG PO TABS: 10.0000 mg | ORAL_TABLET | Freq: Two times a day (BID) | ORAL | 0 refills | Status: DC | PRN

## 2017-04-23 NOTE — ED Triage Notes (Signed)
Patient with right lower leg pain since last week.  Patient states that it hurts to walk on leg and nothing OTC makes it any better.  Right leg is swollen, painful to touch.   She had a DVT over a year ago, was on therapy and has been off for a year.

## 2017-04-23 NOTE — ED Provider Notes (Signed)
[facility] EMERGENCY DEPARTMENT Provider Note   CSN: [id number] Arrival date & time: 04/23/17  7425     History   Chief Complaint Chief Complaint  Patient presents with  . Leg Pain    HPI Yolanda Brown is a 57 y.o. female with a h/o of obsesity, DVT, HTN, and GERD who presents to the emergency department with a chief complaint of right leg pain.  She endorses a constant, nagging right leg pain that extends from the calf up to the mid thigh that began 1 week ago, but has continued to worsen.  She reports the pain was so severe last night that she was unable to sleep after 1 AM.  Pain is worse when she straightens her leg and improved when she externally rotates it.  She states that the current pain feels like her previous DVT, which was treated with Lovenox and Eliquis for 6 months.  It was suspected to be secondary to HRT, which she no longer takes.   She also endorses mild dyspnea with walking over the last week.  She denies fever, chills, rash, redness or swelling to the bilateral lower extremities, nausea, vomiting, diarrhea, dizziness, headache, or lightheadedness.  She treated her symptoms with Aleve without improvement.  She is a non-smoker.  No recent long travel, surgery, or immobilization.  No family history of DVT or PE.  The history is provided by the patient. No language interpreter was used.    Past Medical History:  Diagnosis Date  . Allergy   . Anxiety 2013   has seen Dr. Evelene Croon prior  . Depression 2013   Dr. Evelene Croon prior  . Diverticulitis 06/2013   3 total episodes  . DVT (deep vein thrombosis) in pregnancy (HCC)   . GERD (gastroesophageal reflux disease)   . Hypertension   . Vitamin D deficiency     Patient Active Problem List   Diagnosis Date Noted  . Obesity 08/06/2015  . Impaired fasting blood sugar 08/06/2015  . Routine general medical examination at a health care facility 08/06/2015  . Vaccine counseling 08/06/2015  . Screening  for breast cancer 08/06/2015  . Chronic fatigue 08/06/2015  . Vitamin D deficiency 08/06/2015  . Non-restorative sleep 08/06/2015  . Snoring 08/06/2015  . Daytime somnolence 08/06/2015  . Anxiety and depression 01/25/2015  . History of DVT (deep vein thrombosis) 01/25/2015  . Essential hypertension 01/25/2015    Past Surgical History:  Procedure Laterality Date  . ABDOMINAL HYSTERECTOMY     fibroids, still has ovaries  . CHOLECYSTECTOMY    . COLONOSCOPY  2011   [place], [place]; polyp and diverticulosis  . WISDOM TOOTH EXTRACTION      OB History    No data available       Home Medications    Prior to Admission medications   Medication Sig Start Date End Date Taking? Authorizing Provider  ALPRAZolam Prudy Feeler) 0.5 MG tablet take 1 TABLET BY MOUTH TWICE DAILY AS NEEDED for anxiety 11/13/16  Yes Tysinger, Kermit Balo, PA-C  amLODipine (NORVASC) 5 MG tablet Take 1 tablet (5 mg total) by mouth daily. 11/13/16  Yes Tysinger, Kermit Balo, PA-C  benzonatate (TESSALON) 100 MG capsule Take 2 capsules (200 mg total) by mouth 3 (three) times daily as needed for cough. 11/05/16  Yes Deatra Canter, FNP  buPROPion (WELLBUTRIN XL) 300 MG 24 hr tablet Take 1 tablet (300 mg total) by mouth daily. 11/13/15  Yes Tysinger, Kermit Balo, PA-C  diphenhydrAMINE (BENADRYL) 25 MG  tablet Take 1 tablet (25 mg total) by mouth every 6 (six) hours. X 3 days, then as needed Patient taking differently: Take 25 mg by mouth every 6 (six) hours as needed for itching or allergies. X 3 days, then as needed 09/15/16  Yes West, Emily, PA-C  Multiple Vitamin (MULITIVITAMIN WITH MINERALS) TABS Take 1 tablet by mouth every morning.    Yes [provider]  Omeprazole-Sodium Bicarbonate (ZEGERID OTC PO) Take 1 tablet by mouth daily.   Yes [provider]  ondansetron (ZOFRAN-ODT) 8 MG disintegrating tablet Take 1 tablet (8 mg total) by mouth every 8 (eight) hours as needed for nausea. 11/23/16  Yes Wallis Bamberg, PA-C    triamterene-hydrochlorothiazide (MAXZIDE-25) 37.5-25 MG tablet take 1 TABLET BY MOUTH EVERY DAY 11/16/16  Yes Tysinger, Kermit Balo, PA-C  Vitamin D, Ergocalciferol, (DRISDOL) 50000 units CAPS capsule Take 1 capsule (50,000 Units total) by mouth every 7 (seven) days. 08/07/15  Yes Tysinger, Kermit Balo, PA-C  cyclobenzaprine (FLEXERIL) 10 MG tablet Take 1 tablet (10 mg total) by mouth 2 (two) times daily as needed for muscle spasms. 04/23/17   Gifford Ballon A, PA-C  dabigatran (PRADAXA) 150 MG CAPS capsule Take 1 capsule (150 mg total) by mouth 2 (two) times daily. Start taking 10-12 hours after the final Lovenox dose 06/29/14 07/15/14  Pisciotta, Joni Reining, PA-C    Family History Family History  Problem Relation Age of Onset  . Diabetes Mother   . Heart disease Mother   . Cancer Father        prostate, mets  . Diabetes Father   . Hypertension Sister   . Diabetes Sister   . Stroke Sister   . Hypertension Brother   . Obesity Brother   . Hypertension Brother   . Other Brother        died of sepsis s/p perforated bowel  . Obesity Brother   . Heart disease Sister   . Hypertension Sister   . Diabetes Sister   . Hypertension Other   . Diabetes Other   . Cancer Paternal Uncle        13 uncles all died with cancer, various types    Social History Social History   Tobacco Use  . Smoking status: Never Smoker  . Smokeless tobacco: Never Used  Substance Use Topics  . Alcohol use: Yes    Alcohol/week: 1.2 oz    Types: 2 Glasses of wine per week    Comment: rare  . Drug use: No     Allergies   Aspirin   Review of Systems Review of Systems  Constitutional: Negative for activity change, chills and fever.  Respiratory: Positive for shortness of breath. Negative for cough.   Cardiovascular: Negative for chest pain.  Gastrointestinal: Negative for abdominal pain, diarrhea, nausea and vomiting.  Musculoskeletal: Positive for arthralgias, gait problem and myalgias. Negative for back pain.   Skin: Negative for rash and wound.  Allergic/Immunologic: Negative for immunocompromised state.  Neurological: Negative for dizziness, weakness, light-headedness, numbness and headaches.     Physical Exam Updated Vital Signs BP (!) 124/93 (BP Location: Right Arm)   Pulse 85   Temp 97.9 F (36.6 C) (Oral)   Resp 16   SpO2 96%   Physical Exam  Constitutional: She is oriented to person, place, and time. No distress.  Obese female.   HENT:  Head: Normocephalic.  Eyes: Conjunctivae are normal.  Neck: Normal range of motion. Neck supple.  Cardiovascular: Normal rate, regular rhythm, normal heart  sounds and intact distal pulses. Exam reveals no gallop and no friction rub.  No murmur heard. Pulmonary/Chest: Effort normal. No stridor. No respiratory distress. She has no wheezes. She has no rales. She exhibits no tenderness.  Abdominal: Soft. She exhibits no distension.  Musculoskeletal: Normal range of motion. She exhibits tenderness. She exhibits no edema or deformity.  Tender to palpation to the right calf that extends up the medial aspect of the right mid-thigh.  She is nontender to palpation over the anterior, lateral, or posterior thigh.  No obvious right leg edema.  No overlying warmth or erythema.  Full active and passive range of motion of the bilateral ankles, knees, and hips.  5 out of 5 strength against resistance with dorsiflexion and plantarflexion.  Sensation is intact throughout.  Neurological: She is alert and oriented to person, place, and time.  Skin: Skin is warm. Capillary refill takes less than 2 seconds. No rash noted. She is not diaphoretic.  Psychiatric: Her behavior is normal.  Nursing note and vitals reviewed.    ED Treatments / Results  Labs (all labs ordered are listed, but only abnormal results are displayed) Labs Reviewed  CBC - Abnormal; Notable for the following components:      Result Value   RBC 5.24 (*)    All other components within normal limits   COMPREHENSIVE METABOLIC PANEL - Abnormal; Notable for the following components:   Glucose, Bld 118 (*)    All other components within normal limits  D-DIMER, QUANTITATIVE (NOT AT [facility])    EKG  EKG Interpretation  Date/Time:  Friday April 23 2017 07:11:15 EST Ventricular Rate:  89 PR Interval:    QRS Duration: 89 QT Interval:  392 QTC Calculation: 477 R Axis:   50 Text Interpretation:  Sinus rhythm Confirmed by Palumbo, April ([id number]) on 04/23/2017 7:13:28 AM       Radiology Ct Angio Chest Pe W And/or Wo Contrast  Result Date: 04/23/2017 CLINICAL DATA:  Chest pain.  History of DVT EXAM: CT ANGIOGRAPHY CHEST WITH CONTRAST TECHNIQUE: Multidetector CT imaging of the chest was performed using the standard protocol during bolus administration of intravenous contrast. Multiplanar CT image reconstructions and MIPs were obtained to evaluate the vascular anatomy. CONTRAST:  ISOVUE-370 IOPAMIDOL (ISOVUE-370) INJECTION 76% COMPARISON:  None. FINDINGS: Cardiovascular: Satisfactory opacification of the pulmonary arteries to the segmental level. No evidence of pulmonary embolism when accounting for mild intermittent motion artifact and diminished detail from soft tissue attenuation. Normal heart size. No pericardial effusion. Mediastinum/Nodes: Moderate sliding hiatal hernia. Negative for adenopathy Lungs/Pleura: The central airways are clear. There is no edema, consolidation, effusion, or pneumothorax. Upper Abdomen: Negative Musculoskeletal: Spondylosis.  No acute finding. Review of the MIP images confirms the above findings. IMPRESSION: 1. Negative for pulmonary embolism or other acute finding. 2. Moderate sliding hiatal hernia. Electronically Signed   By: Marnee Spring M.D.   On: 04/23/2017 10:20    Procedures Procedures (including critical care time)  Medications Ordered in ED Medications  oxyCODONE-acetaminophen (PERCOCET/ROXICET) 5-325 MG per tablet 1 tablet (1 tablet Oral Given  04/23/17 0823)  iopamidol (ISOVUE-370) 76 % injection (100 mLs  Contrast Given 04/23/17 0958)     Initial Impression / Assessment and Plan / ED Course  I have reviewed the triage vital signs and the nursing notes.  Pertinent labs & imaging results that were available during my care of the patient were reviewed by me and considered in my medical decision making (see chart for details).  57 year old female with a h/o of obsesity, DVT, HTN, and GERD who presents to the emergency department with a chief complaint of right leg pain.  Previous DVT was suspected to be secondary to HRT, which she no longer takes.  CBC and CMP are unremarkable.  DVT study of the right leg is unremarkable.  CT Chest angio is negative.  Moderate sliding hiatal hernia was demonstrated, which the patient is aware.  Pain significantly improved with Percocet.  She currently has no shortness of breath.  Will treat with RICE therapy and anti-inflammatories.  Strict return precautions given.  She is currently in no acute distress.  The patient is safe for discharge home at this time.  Final Clinical Impressions(s) / ED Diagnoses   Final diagnoses:  Right leg pain    ED Discharge Orders        Ordered    cyclobenzaprine (FLEXERIL) 10 MG tablet  2 times daily PRN     04/23/17 1109       Gitty Osterlund A, PA-C 04/23/17 1349    Palumbo, April, MD 04/28/17 [id number]

## 2017-04-23 NOTE — Discharge Instructions (Signed)
Take 600 mg of ibuprofen with food every 8 hours as needed for pain or 650 mg of Tylenol every 6 hours as needed for pain.  Apply ice to areas that are sore for 15-20 minutes as frequently as needed.  Please elevate the extremity above the level of your heart when you are resting.  Stretch as your pain allows.  You can also take 1 tablet of Flexeril up to 2 times daily as needed for muscle pain or spasms.  Please do not drive or work while taking this medication because it can make you drowsy.  If your symptoms do not start to improve in the next 3-4 days, please follow-up with your primary care provider.  If you develop new or worsening symptoms, including a rash over the area, if the leg becomes red, hot, or swollen, if you develop chest pain, or fever chills, or other concerning symptoms, please return to the emergency department for reevaluation.

## 2017-04-23 NOTE — Progress Notes (Signed)
Preliminary results by tech - Right Lower Ext. Venous Duplex Completed. Negative for deep and superficial vein thrombosis and Baker's cyst. Willie Loy, BS, RDMS, RVT  

## 2017-04-26 ENCOUNTER — Ambulatory Visit
Admission: RE | Admit: 2017-04-26 | Discharge: 2017-04-26 | Disposition: A | Discharge status: Home / Self Care | Payer: Commercial Managed Care - PPO | Source: Ambulatory Visit | Attending: *Deleted | Admitting: *Deleted

## 2017-04-26 DIAGNOSIS — Z1231 Encounter for screening mammogram for malignant neoplasm of breast: Secondary | ICD-10-CM | POA: Diagnosis not present

## 2017-08-31 ENCOUNTER — Emergency Department (HOSPITAL_COMMUNITY): Payer: Self-pay

## 2017-08-31 ENCOUNTER — Encounter (HOSPITAL_COMMUNITY): Payer: Self-pay

## 2017-08-31 ENCOUNTER — Emergency Department (HOSPITAL_COMMUNITY)
Admission: EM | Admit: 2017-08-31 | Discharge: 2017-08-31 | Disposition: A | Discharge status: Home / Self Care | Payer: Self-pay | Attending: Emergency Medicine | Admitting: Emergency Medicine

## 2017-08-31 ENCOUNTER — Other Ambulatory Visit: Payer: Self-pay

## 2017-08-31 DIAGNOSIS — Z79899 Other long term (current) drug therapy: Secondary | ICD-10-CM | POA: Insufficient documentation

## 2017-08-31 DIAGNOSIS — I471 Supraventricular tachycardia: Secondary | ICD-10-CM | POA: Insufficient documentation

## 2017-08-31 DIAGNOSIS — K449 Diaphragmatic hernia without obstruction or gangrene: Secondary | ICD-10-CM | POA: Insufficient documentation

## 2017-08-31 DIAGNOSIS — I10 Essential (primary) hypertension: Secondary | ICD-10-CM | POA: Insufficient documentation

## 2017-08-31 LAB — COMPREHENSIVE METABOLIC PANEL
ALT: 34 U/L (ref 0–44)
AST: 45 U/L — ABNORMAL HIGH (ref 15–41)
Albumin: 3.6 g/dL (ref 3.5–5.0)
Alkaline Phosphatase: 57 U/L (ref 38–126)
Anion gap: 13 (ref 5–15)
BUN: 20 mg/dL (ref 6–20)
CO2: 21 mmol/L — ABNORMAL LOW (ref 22–32)
Calcium: 9.5 mg/dL (ref 8.9–10.3)
Chloride: 105 mmol/L (ref 98–111)
Creatinine, Ser: 1.21 mg/dL — ABNORMAL HIGH (ref 0.44–1.00)
GFR calc Af Amer: 56 mL/min — ABNORMAL LOW (ref >60)
GFR calc non Af Amer: 48 mL/min — ABNORMAL LOW (ref >60)
Glucose, Bld: 146 mg/dL — ABNORMAL HIGH (ref 70–99)
Potassium: 3.7 mmol/L (ref 3.5–5.1)
Sodium: 139 mmol/L (ref 135–145)
Total Bilirubin: 0.7 mg/dL (ref 0.3–1.2)
Total Protein: 7.6 g/dL (ref 6.5–8.1)

## 2017-08-31 LAB — CBC WITH DIFFERENTIAL/PLATELET
Abs Immature Granulocytes: 0 10*3/uL (ref 0.0–0.1)
Basophils Absolute: 0.1 10*3/uL (ref 0.0–0.1)
Basophils Relative: 1 %
Eosinophils Absolute: 0.3 10*3/uL (ref 0.0–0.7)
Eosinophils Relative: 3 %
HCT: 44.6 % (ref 36.0–46.0)
Hemoglobin: 14.1 g/dL (ref 12.0–15.0)
Immature Granulocytes: 0 %
Lymphocytes Relative: 41 %
Lymphs Abs: 4.4 10*3/uL — ABNORMAL HIGH (ref 0.7–4.0)
MCH: 27 pg (ref 26.0–34.0)
MCHC: 31.6 g/dL (ref 30.0–36.0)
MCV: 85.3 fL (ref 78.0–100.0)
Monocytes Absolute: 0.7 10*3/uL (ref 0.1–1.0)
Monocytes Relative: 7 %
Neutro Abs: 5.3 10*3/uL (ref 1.7–7.7)
Neutrophils Relative %: 48 %
Platelets: 326 10*3/uL (ref 150–400)
RBC: 5.23 MIL/uL — ABNORMAL HIGH (ref 3.87–5.11)
RDW: 16.7 % — ABNORMAL HIGH (ref 11.5–15.5)
WBC: 10.9 10*3/uL — ABNORMAL HIGH (ref 4.0–10.5)

## 2017-08-31 LAB — URINALYSIS, ROUTINE W REFLEX MICROSCOPIC
Bilirubin Urine: NEGATIVE
Glucose, UA: NEGATIVE mg/dL
Hgb urine dipstick: NEGATIVE
Ketones, ur: NEGATIVE mg/dL
Leukocytes, UA: NEGATIVE
Nitrite: NEGATIVE
Protein, ur: NEGATIVE mg/dL
Specific Gravity, Urine: 1.015 (ref 1.005–1.030)
pH: 6 (ref 5.0–8.0)

## 2017-08-31 LAB — TSH: TSH: 2.854 u[IU]/mL (ref 0.350–4.500)

## 2017-08-31 LAB — TROPONIN I: Troponin I: <0.03 ng/mL (ref <0.03)

## 2017-08-31 LAB — T4, FREE: Free T4: 1.01 ng/dL (ref 0.82–1.77)

## 2017-08-31 MED ORDER — IOPAMIDOL (ISOVUE-370) INJECTION 76%
INTRAVENOUS | Status: AC
  Filled 2017-08-31: qty 100

## 2017-08-31 MED ORDER — SODIUM CHLORIDE 0.9 % IV BOLUS
500.0000 mL | Freq: Once | INTRAVENOUS | Status: AC
  Administered 2017-08-31: 500 mL via INTRAVENOUS

## 2017-08-31 MED ORDER — ONDANSETRON HCL 4 MG/2ML IJ SOLN
4.0000 mg | Freq: Once | INTRAMUSCULAR | Status: AC
  Administered 2017-08-31: 4 mg via INTRAVENOUS
  Filled 2017-08-31: qty 2

## 2017-08-31 MED ORDER — IOPAMIDOL (ISOVUE-370) INJECTION 76%
100.0000 mL | Freq: Once | INTRAVENOUS | Status: AC | PRN
  Administered 2017-08-31: 100 mL via INTRAVENOUS

## 2017-08-31 NOTE — ED Notes (Signed)
Patient transported to X-ray 

## 2017-08-31 NOTE — ED Triage Notes (Signed)
Pt states she felt her HR increase at work and she checked her pulse and it was elevated. Pt states recent episode of same 2 weeks ago. Onset today around 0600.

## 2017-08-31 NOTE — ED Notes (Addendum)
Pt made aware of need for urine specimen. Pt unable to provide urine sample at this time.

## 2017-08-31 NOTE — ED Provider Notes (Signed)
[facility] Provider Note   CSN: [id number] Arrival date & time: 08/31/17  1316     History   Chief Complaint Chief Complaint  Patient presents with  . Tachycardia    HPI Stephania FragminFaith B Deford is a 57 y.o. female.  HPI She began having palpitations, lightheadedness, shortness of breath and chest tightness around 7 AM this morning to getting off work.  States she had one similar episode in the past.  She states she does drink coffee routinely.  No new lower extremity swelling or pain. Past Medical History:  Diagnosis Date  . Allergy   . Anxiety 2013   has seen Dr. Evelene CroonKaur prior  . Depression 2013   Dr. Evelene CroonKaur prior  . Diverticulitis 06/2013   3 total episodes  . DVT (deep vein thrombosis) in pregnancy (HCC)   . GERD (gastroesophageal reflux disease)   . Hypertension   . Vitamin D deficiency     Patient Active Problem List   Diagnosis Date Noted  . Obesity 08/06/2015  . Impaired fasting blood sugar 08/06/2015  . Routine general medical examination at a health care facility 08/06/2015  . Vaccine counseling 08/06/2015  . Screening for breast cancer 08/06/2015  . Chronic fatigue 08/06/2015  . Vitamin D deficiency 08/06/2015  . Non-restorative sleep 08/06/2015  . Snoring 08/06/2015  . Daytime somnolence 08/06/2015  . Anxiety and depression 01/25/2015  . History of DVT (deep vein thrombosis) 01/25/2015  . Essential hypertension 01/25/2015    Past Surgical History:  Procedure Laterality Date  . ABDOMINAL HYSTERECTOMY     fibroids, still has ovaries  . BREAST BIOPSY Left 2012   stereo/neg  . CHOLECYSTECTOMY    . [place]  [place]   [place]; polyp and diverticulosis  . WISDOM TOOTH EXTRACTION       OB History   None      Home Medications    Prior to Admission medications   Medication Sig Start Date End Date Taking? Authorizing Provider  ALPRAZolam Prudy Feeler(XANAX) 0.5 MG tablet take 1 TABLET BY MOUTH TWICE DAILY AS NEEDED for  anxiety 11/13/16  Yes Tysinger, Kermit Baloavid S, PA-C  amLODipine (NORVASC) 5 MG tablet Take 1 tablet (5 mg total) by mouth daily. 11/13/16  Yes Tysinger, Kermit Baloavid S, PA-C  Multiple Vitamin (MULITIVITAMIN WITH MINERALS) TABS Take 1 tablet by mouth every morning.    Yes [provider]  Omeprazole-Sodium Bicarbonate (ZEGERID OTC PO) Take 1 tablet by mouth daily.   Yes [provider]  triamterene-hydrochlorothiazide (MAXZIDE-25) 37.5-25 MG tablet take 1 TABLET BY MOUTH EVERY DAY 11/16/16  Yes Tysinger, Kermit Baloavid S, PA-C  benzonatate (TESSALON) 100 MG capsule Take 2 capsules (200 mg total) by mouth 3 (three) times daily as needed for cough. Patient not taking: Reported on 08/31/2017 11/05/16   Deatra Canterxford, William J, FNP  buPROPion (WELLBUTRIN XL) 300 MG 24 hr tablet Take 1 tablet (300 mg total) by mouth daily. Patient not taking: Reported on 08/31/2017 11/13/15   Tysinger, Kermit Baloavid S, PA-C  cyclobenzaprine (FLEXERIL) 10 MG tablet Take 1 tablet (10 mg total) by mouth 2 (two) times daily as needed for muscle spasms. Patient not taking: Reported on 08/31/2017 04/23/17   McDonald, Pedro EarlsMia A, PA-C  diphenhydrAMINE (BENADRYL) 25 MG tablet Take 1 tablet (25 mg total) by mouth every 6 (six) hours. X 3 days, then as needed Patient not taking: Reported on 08/31/2017 09/15/16   Trixie DredgeWest, Emily, PA-C  ondansetron (ZOFRAN-ODT) 8 MG disintegrating tablet Take 1 tablet (8 mg  total) by mouth every 8 (eight) hours as needed for nausea. Patient not taking: Reported on 08/31/2017 11/23/16   Wallis Bamberg, PA-C  Vitamin D, Ergocalciferol, (DRISDOL) 50000 units CAPS capsule Take 1 capsule (50,000 Units total) by mouth every 7 (seven) days. Patient not taking: Reported on 08/31/2017 08/07/15   Tysinger, Kermit Balo, PA-C    Family History Family History  Problem Relation Age of Onset  . Diabetes Mother   . Heart disease Mother   . Cancer Father        prostate, mets  . Diabetes Father   . Hypertension Sister   . Diabetes Sister   . Stroke Sister    . Hypertension Brother   . Obesity Brother   . Hypertension Brother   . Other Brother        died of sepsis s/p perforated bowel  . Obesity Brother   . Heart disease Sister   . Hypertension Sister   . Diabetes Sister   . Hypertension Other   . Diabetes Other   . Cancer Paternal Uncle        13 uncles all died with cancer, various types    Social History Social History   Tobacco Use  . Smoking status: Never Smoker  . Smokeless tobacco: Never Used  Substance Use Topics  . Alcohol use: Yes    Alcohol/week: 1.2 oz    Types: 2 Glasses of wine per week    Comment: rare  . Drug use: No     Allergies   Aspirin   Review of Systems Review of Systems  Constitutional: Positive for fatigue. Negative for chills and fever.  Eyes: Negative for visual disturbance.  Respiratory: Positive for chest tightness and shortness of breath. Negative for cough.   Cardiovascular: Positive for palpitations. Negative for chest pain and leg swelling.  Gastrointestinal: Negative for abdominal pain, constipation, diarrhea, nausea and vomiting.  Genitourinary: Negative for dysuria, flank pain and frequency.  Musculoskeletal: Negative for back pain, myalgias and neck pain.  Skin: Negative for rash and wound.  Neurological: Positive for light-headedness. Negative for weakness, numbness and headaches.  Psychiatric/Behavioral: The patient is nervous/anxious.   All other systems reviewed and are negative.    Physical Exam Updated Vital Signs BP 116/78   Pulse 85   Temp 98.2 F (36.8 C) (Oral)   Resp 16   SpO2 98%   Physical Exam  Constitutional: She is oriented to person, place, and time. She appears well-developed and well-nourished.  HENT:  Head: Normocephalic and atraumatic.  Mouth/Throat: Oropharynx is clear and moist. No oropharyngeal exudate.  Eyes: Pupils are equal, round, and reactive to light. EOM are normal.  Neck: Normal range of motion. Neck supple.  Cardiovascular: Regular  rhythm.  Tachycardia  Pulmonary/Chest: Effort normal and breath sounds normal.  Abdominal: Soft. Bowel sounds are normal. There is no tenderness. There is no rebound and no guarding.  Musculoskeletal: Normal range of motion. She exhibits no edema or tenderness.  No lower extremity swelling, asymmetry or tenderness.  Neurological: She is alert and oriented to person, place, and time.  Moves all extremities without focal deficit.  Sensation intact.  Skin: Skin is warm and dry. No rash noted. No erythema.  Psychiatric: Her behavior is normal.  Anxious appearing  Nursing note and vitals reviewed.    ED Treatments / Results  Labs (all labs ordered are listed, but only abnormal results are displayed) Labs Reviewed  CBC WITH DIFFERENTIAL/PLATELET - Abnormal; Notable for the following components:  Result Value   WBC 10.9 (*)    RBC 5.23 (*)    RDW 16.7 (*)    Lymphs Abs 4.4 (*)    All other components within normal limits  COMPREHENSIVE METABOLIC PANEL - Abnormal; Notable for the following components:   CO2 21 (*)    Glucose, Bld 146 (*)    Creatinine, Ser 1.21 (*)    AST 45 (*)    GFR calc non Af Amer 48 (*)    GFR calc Af Amer 56 (*)    All other components within normal limits  TROPONIN I  TSH  T4, FREE  URINALYSIS, ROUTINE W REFLEX MICROSCOPIC    EKG EKG Interpretation  Date/Time:  Tuesday August 31 2017 16:44:21 EDT Ventricular Rate:  88 PR Interval:    QRS Duration: 87 QT Interval:  394 QTC Calculation: 477 R Axis:   74 Text Interpretation:  Sinus rhythm svt resolved since last tracing Confirmed by Linwood Dibbles [phone]) on 08/31/2017 4:48:16 PM Also confirmed by Linwood Dibbles [phone]), editor Elita Quick ([id number])  on 09/01/2017 7:28:42 AM   Radiology No results found.  Procedures Procedures (including critical care time)  Medications Ordered in ED Medications  sodium chloride 0.9 % bolus 500 mL (0 mLs Intravenous Stopped 08/31/17 1515)  ondansetron (ZOFRAN)  injection 4 mg (4 mg Intravenous Given 08/31/17 1444)  sodium chloride 0.9 % bolus 500 mL (0 mLs Intravenous Stopped 08/31/17 1834)  iopamidol (ISOVUE-370) 76 % injection 100 mL (100 mLs Intravenous Contrast Given 08/31/17 1651)     Initial Impression / Assessment and Plan / ED Course  I have reviewed the triage vital signs and the nursing notes.  Pertinent labs & imaging results that were available during my care of the patient were reviewed by me and considered in my medical decision making (see chart for details).    Patient converted to sinus rhythm after modified Valsalva maneuver performed.  She continued to have dyspnea with exertion and tachycardia despite remaining in sinus rhythm.  Signed out to oncoming emergency physician pending CT angios chest and reevaluation. Final Clinical Impressions(s) / ED Diagnoses   Final diagnoses:  SVT (supraventricular tachycardia) (HCC)  Hiatal hernia    ED Discharge Orders    None       Loren Racer, MD 09/03/17 1902

## 2017-08-31 NOTE — Discharge Instructions (Addendum)
Consider following up with a cardiologist, continue current medications, return as needed for worsening symptoms

## 2017-08-31 NOTE — ED Provider Notes (Signed)
Pt seen by Dr Ranae PalmsYelverton, please see his note.   Plan was to follow up on the CT scan.  CT scan without any acute abnormalities.  Patient is aware that she has a hiatal hernia.  Patient's blood pressure has improved while she was in the ED.  She is feeling much better and is ready for discharge.   Yolanda Brown, Yolanda Chavira, MD 08/31/17 [phone]

## 2017-08-31 NOTE — ED Notes (Signed)
Patient transported to CT 

## 2017-09-23 DIAGNOSIS — R0600 Dyspnea, unspecified: Secondary | ICD-10-CM | POA: Insufficient documentation

## 2017-09-23 DIAGNOSIS — R079 Chest pain, unspecified: Secondary | ICD-10-CM | POA: Insufficient documentation

## 2017-10-13 ENCOUNTER — Encounter (INDEPENDENT_AMBULATORY_CARE_PROVIDER_SITE_OTHER): Payer: Self-pay

## 2017-10-13 ENCOUNTER — Ambulatory Visit (INDEPENDENT_AMBULATORY_CARE_PROVIDER_SITE_OTHER): Payer: PRIVATE HEALTH INSURANCE | Admitting: Cardiology

## 2017-10-13 ENCOUNTER — Encounter: Payer: Self-pay | Admitting: *Deleted

## 2017-10-13 ENCOUNTER — Encounter

## 2017-10-13 ENCOUNTER — Encounter: Payer: Self-pay | Admitting: Cardiology

## 2017-10-13 ENCOUNTER — Telehealth: Payer: Self-pay | Admitting: *Deleted

## 2017-10-13 VITALS — BP 146/94 | HR 79 | Ht 67.0 in | Wt 272.0 lb

## 2017-10-13 DIAGNOSIS — R0600 Dyspnea, unspecified: Secondary | ICD-10-CM | POA: Diagnosis not present

## 2017-10-13 DIAGNOSIS — I471 Supraventricular tachycardia: Secondary | ICD-10-CM

## 2017-10-13 DIAGNOSIS — R0683 Snoring: Secondary | ICD-10-CM | POA: Diagnosis not present

## 2017-10-13 MED ORDER — METOPROLOL SUCCINATE ER 50 MG PO TB24: 50.0000 mg | ORAL_TABLET | Freq: Every day | ORAL | 3 refills | Status: DC

## 2017-10-13 NOTE — Progress Notes (Signed)
10/13/2017 Yolanda Brown   11-17-1960  [id number]  Primary Physician Marva Panda, NP Primary Cardiologist: New (Dr. Anne Fu DOD)  Reason for Visit/CC: New Pt Evaluation for SVT  HPI:  Yolanda Brown is a 57 y.o. female who is being seen today, as a new pt, for the evaluation of SVT, at the request of Jac Canavan, PA-C.  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 Her PMH is notable for provoked DVT in 2016, in the setting of HRT. She was treated with Xarelto x 6 months. Also has a h/o HTN, prediabetes and obesity. She reports history of snoring and fatigue during wake hours but has never had a sleep study. She works night shift, thus does not get much physical activity as it is hard to fit into her schedule (typically sleeping during the day). She notes a FH of heart disease. Her mother died with CHF at age 48. She is unsure if she had coronary disease. Her sister also had CHF but had other medical problems and died from noncardiac causes. She denies any h/o tobacco use.   She is here today for evaluation given recurrent SVT.  She notes 3 episodes over the past 3 months.  Each episode has occurred at rest.  She notes sudden development of tachypalpitations with heart rates reaching the 160s and 170s. She feels SOB and dizzy. She denies any associated CP. 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  She reports that she was given labetalol the first episode which reduced her heart rate and put her back in normal sinus rhythm.  The second and third episodes both broke with vagal maneuvers.  Her third episode, she was evaluated in the [facility] emergency department.  Laboratory work including TSH, electrolytes and CBC were all within normal limits.  UA was also negative.  Given her recurrent arrhythmia, she was referred to our office for further management and evaluation.  She is currently asymptomatic in clinic today.  I have  reviewed her EKG from the [facility] emergency department along with Dr. Anne Fu DOD and confirmed that she did have documented SVT.  Her medications are have been reviewed and she does not appear to be on any rate control lowering medications.  She is on both amlodipine and hydrochlorothiazide for blood pressure.  Her blood pressure is elevated in clinic today at 146/94 and resting pulse rate is 79 bpm.  Given her history of snoring, we had her fill out the Epworth sleepiness scale and her total is elevated at 15.  Current Meds  Medication Sig  . ALPRAZolam (XANAX) 0.5 MG tablet take 1 TABLET BY MOUTH TWICE DAILY AS NEEDED for anxiety  . amLODipine (NORVASC) 5 MG tablet Take 1 tablet (5 mg total) by mouth daily.  . Multiple Vitamin (MULITIVITAMIN WITH MINERALS) TABS Take 1 tablet by mouth every morning.   [name] Bicarbonate (ZEGERID OTC PO) Take 1 tablet by mouth daily.  Marland Kitchen triamterene-hydrochlorothiazide (MAXZIDE-25) 37.5-25 MG tablet take 1 TABLET BY MOUTH EVERY DAY   Allergies  Allergen Reactions  . Aspirin Hives   Past Medical History:  Diagnosis Date  . Allergy   . Anxiety 2013   has seen Dr. Evelene Croon prior  . Depression 2013   Dr. Evelene Croon prior  . Diverticulitis 06/2013   3 total episodes  . DVT (deep vein thrombosis) in pregnancy (HCC)   . GERD (gastroesophageal reflux disease)   .  Hypertension   . Vitamin D deficiency    Family History  Problem Relation Age of Onset  . Diabetes Mother   . Heart disease Mother   . Cancer Father        prostate, mets  . Diabetes Father   . Hypertension Sister   . Diabetes Sister   . Stroke Sister   . Hypertension Brother   . Obesity Brother   . Hypertension Brother   . Other Brother        died of sepsis s/p perforated bowel  . Obesity Brother   . Heart disease Sister   . Hypertension Sister   . Diabetes Sister   . Hypertension Other   . Diabetes Other   . Cancer Paternal Uncle        13 uncles all died with cancer, various  types   Past Surgical History:  Procedure Laterality Date  . ABDOMINAL HYSTERECTOMY     fibroids, still has ovaries  . BREAST BIOPSY Left 2012   stereo/neg  . CHOLECYSTECTOMY    . COLONOSCOPY  2011   [place], [place]; polyp and diverticulosis  . WISDOM TOOTH EXTRACTION     Social History   Socioeconomic History  . Marital status: Married    Spouse name: Not on file  . Number of children: Not on file  . Years of education: Not on file  . Highest education level: Not on file  Occupational History  . Not on file  Social Needs  . Financial resource strain: Not on file  . Food insecurity:    Worry: Not on file    Inability: Not on file  . Transportation needs:    Medical: Not on file    Non-medical: Not on file  Tobacco Use  . Smoking status: Never Smoker  . Smokeless tobacco: Never Used  Substance and Sexual Activity  . Alcohol use: Yes    Alcohol/week: 1.2 oz    Types: 2 Glasses of wine per week    Comment: rare  . Drug use: No  . Sexual activity: Not on file  Lifestyle  . Physical activity:    Days per week: Not on file    Minutes per session: Not on file  . Stress: Not on file  Relationships  . Social connections:    Talks on phone: Not on file    Gets together: Not on file    Attends religious service: Not on file    Active member of club or organization: Not on file    Attends meetings of clubs or organizations: Not on file    Relationship status: Not on file  . Intimate partner violence:    Fear of current or ex partner: Not on file    Emotionally abused: Not on file    Physically abused: Not on file    Forced sexual activity: Not on file  Other Topics Concern  . 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  Cardiovascular: negative for chest pain, dyspnea on exertion, edema, orthopnea,  palpitations, paroxysmal nocturnal dyspnea or shortness of breath Dermatological: negative for rash Respiratory: negative for cough or wheezing Urologic: negative for hematuria Abdominal: negative for nausea, vomiting, diarrhea, bright red blood per rectum, melena, or hematemesis Neurologic: negative for visual changes, syncope, or dizziness All other systems reviewed and  are otherwise negative except as noted above.   Physical Exam:  Blood pressure (!) 146/94, pulse 79, height 5\' 7"  (1.702 m), weight 272 lb (123.4 kg), SpO2 94 %.  General appearance: alert, cooperative, no distress and moderately obese Neck: no carotid bruit and no JVD Lungs: clear to auscultation bilaterally Heart: regular rate and rhythm, S1, S2 normal, no murmur, click, rub or gallop Extremities: extremities normal, atraumatic, no cyanosis or edema Pulses: 2+ and symmetric Skin: Skin color, texture, turgor normal. No rashes or lesions Neurologic: Grossly normal  EKG not performed today, ED EKG reviewed, SVT confirmed -- personally reviewed   ASSESSMENT AND PLAN:   1. PSVT: 3 episodes in the last 3 months, breaking after IV labetalol and vagal maneuvers. She has never recurred adenosine. [facility] showed normal TSH and electrolytes. I have discussed case with Dr. Anne FuSkains, DOD. We will initiate beta-blocker therapy with metoprolol succinate, 50 mg daily.  If she has recurrent episodes of SVT, she may take an extra tablet of metoprolol and try vagal maneuvers.  We also discussed the importance of avoiding triggers such as excess caffeine and alcohol.  I also recommended adequate hydration and weight loss.  Given her history of obesity, snoring, fatigue during wake hours and elevated Epworth Sleepiness Scale score at 15, we will have her undergo a sleep study to rule out obstructive sleep apnea.  We will also obtain an echocardiogram to assess LV function, valvular anatomy and chamber size.  We will have her follow-up  in 6 months for repeat evaluation.  If she has recurrent SVT despite treatment with beta-blocker therapy, then we will refer to EP for consideration for SVT ablation.  She will call us if she has any frequent recurrence between now and her six-month follow-up.   2. HTN: mildly elevated w/ amlodipine and HCTZ. Given her recurrent SVT, we will start BB therapy w/ metoprolol 50 mg daily. She will monitor her BP at home/work.   3. Obesity: Body mass index is 42.6 kg/m. Pt acknowledges that she needs to loose weight and is motivated to try. We discussed diet modification and elimination of fast foods. Try to fit physical activity into day if possible.    4. Snoring:  Epworth sleepiness scale =15. Pt is obese with frequent PSVT. We will arrange sleep study to r/o OSA.    Follow-Up w/ Dr. Anne FuSkains in 6 months.   Brittainy Delmer IslamSimmons PA-C, [facility] 10/13/2017 2:46 PM

## 2017-10-13 NOTE — Patient Instructions (Addendum)
Medication Instructions:   START TAKING METOPROLOL SUCCINATE  50 MG ONCE A DAY    IF ANY EPISODES OF SVT MAY TAKE AN EXTRA TABLET AS NEEDED  If you need a refill on your cardiac medications before your next appointment, please call your pharmacy.  Labwork: NONE ORDERED  TODAY    Testing/Procedures: Your physician has requested that you have an echocardiogram. Echocardiography is a painless test that uses sound waves to create images of your heart. It provides your doctor with information about the size and shape of your heart and how well your heart's chambers and valves are working. This procedure takes approximately one hour. There are no restrictions for this procedure.  Your physician has recommended that you have a sleep study. This test records several body functions during sleep, including: brain activity, eye movement, oxygen and carbon dioxide blood levels, heart rate and rhythm, breathing rate and rhythm, the flow of air through your mouth and nose, snoring, body muscle movements, and chest and belly movement. SOME WILL CONTACT YOU BACK FOR FURTHER STEPS.    Follow-Up:  Your physician wants you to follow-up in:  IN  6  MONTHS WITH DR Anne FuSKAINS  You will receive a reminder letter in the mail two months in advance. If you don't receive a letter, please call our office to schedule the follow-up appointment.   Any Other Special Instructions Will Be Listed Below (If Applicable).  MAKE SURE  YOU AVOID ANY CAFFEINE DUE TO YOU CAN INCREASE HEART RATE

## 2017-10-13 NOTE — Telephone Encounter (Signed)
PT NEEDS SLEEP STUDY Yolanda Brown, Shana M, CMA  P Cv Div Sleep Studies  Cc: Reesa ChewJones, [name] G, CMA

## 2017-10-13 NOTE — Telephone Encounter (Signed)
This encounter was created in error - please disregard.

## 2017-10-14 ENCOUNTER — Telehealth: Payer: Self-pay | Admitting: *Deleted

## 2017-10-14 NOTE — Telephone Encounter (Signed)
Left message to return a call. I need to ask patient about her insurance status before scheduling sleep study. Insurance on file terminated per CSR on 05/06/17.

## 2017-10-18 ENCOUNTER — Other Ambulatory Visit: Payer: Self-pay

## 2017-10-18 ENCOUNTER — Ambulatory Visit (HOSPITAL_COMMUNITY): Payer: PRIVATE HEALTH INSURANCE | Attending: Cardiology

## 2017-10-18 DIAGNOSIS — R0683 Snoring: Secondary | ICD-10-CM

## 2017-10-18 DIAGNOSIS — Z6841 Body Mass Index (BMI) 40.0 and over, adult: Secondary | ICD-10-CM | POA: Insufficient documentation

## 2017-10-18 DIAGNOSIS — I10 Essential (primary) hypertension: Secondary | ICD-10-CM | POA: Diagnosis not present

## 2017-10-18 DIAGNOSIS — R0600 Dyspnea, unspecified: Secondary | ICD-10-CM

## 2017-10-18 DIAGNOSIS — E669 Obesity, unspecified: Secondary | ICD-10-CM | POA: Diagnosis not present

## 2017-10-18 DIAGNOSIS — I471 Supraventricular tachycardia: Secondary | ICD-10-CM

## 2017-10-18 NOTE — Telephone Encounter (Signed)
Left message to return a call to discuss her insurance coverage.

## 2017-10-19 ENCOUNTER — Encounter: Payer: Self-pay | Admitting: Physician Assistant

## 2017-10-20 ENCOUNTER — Telehealth: Payer: Self-pay

## 2017-10-20 NOTE — Telephone Encounter (Signed)
-----   Message from Beatrice LecherScott T Weaver, [place]-C sent at 10/19/2017  5:24 PM EDT ----- Results reviewed for Robbie LisBrittainy Simmons, PA-C This study demonstrates:  Normal ejection fraction and no significant valvular abnormalities. Medication changes / Follow up studies / Other recommendations:    - Continue current medications and follow up as planned.  Please send results to the PCP:  Marva PandaMillsaps, Kimberly, NP  Tereso NewcomerScott Weaver, PA-C 10/19/2017 5:23 PM

## 2017-10-20 NOTE — Telephone Encounter (Signed)
Notes recorded by Sigurd Sosapp, Jamarquis Crull, RN on 10/20/2017 at 8:19 AM EDT LPMTCB 8/14 ------

## 2017-10-21 NOTE — Telephone Encounter (Signed)
Left message for her to call back to give updated insurance so that we can get her sleep study pre certed. The current [employer] insurance on file Per Kendal HymenBonnie with [employer] benefits states that it terminated on 05/06/17. This is the 3rd phone message that has been left for the patient. No further attempts will be made. We will pursue the sleep study when and if patient ever returns a call. Message will be forwarded to ordering provider for review.

## 2017-10-25 ENCOUNTER — Encounter: Payer: Self-pay | Admitting: *Deleted

## 2017-11-01 ENCOUNTER — Telehealth: Payer: Self-pay | Admitting: Cardiology

## 2017-11-01 NOTE — Telephone Encounter (Signed)
Spoke with patient, she was informing us that she received a letter regarding the results and stated that someone at our office had already called her. She asked if the results had changed. I informed her of the results and she expressed understanding.

## 2017-11-01 NOTE — Telephone Encounter (Signed)
Follow up:    Patient calling regarding [employer]CO results. Patient hung up

## 2017-11-02 ENCOUNTER — Telehealth: Payer: Self-pay | Admitting: *Deleted

## 2017-11-02 NOTE — Telephone Encounter (Signed)
Patient returned a call and notified me that she now has [employer] insurance. She has not provided a insurance card to get pre certification information from. I will be on vacation after today until 11/02/17. I will follow up once I return to the office.

## 2017-11-02 NOTE — Telephone Encounter (Signed)
Coralee Northina called to inform me that she had spoken with the patient and she tells Coralee Northina that she provided a paper copy of her insurance information when she was her. Per Coralee NorthNina she explained to the patient that we will still need the card so that it can get scanned into the computer. She provider verbal information to Coralee Northina over the telephone. Coralee Northina has faxed the written information to me. I will try to obtain precertification from the paper copy provided to me.

## 2017-11-04 ENCOUNTER — Telehealth: Payer: Self-pay | Admitting: *Deleted

## 2017-11-04 NOTE — Telephone Encounter (Signed)
Left message in lab sleep study appointment has been scheduled for 12/05/17 @ [facility]. The contact # for [facility] left for patient to refer to if she needs to reschedule the appointment.

## 2017-12-05 ENCOUNTER — Ambulatory Visit (HOSPITAL_BASED_OUTPATIENT_CLINIC_OR_DEPARTMENT_OTHER): Payer: PRIVATE HEALTH INSURANCE

## 2017-12-14 DIAGNOSIS — R229 Localized swelling, mass and lump, unspecified: Secondary | ICD-10-CM | POA: Insufficient documentation

## 2017-12-22 ENCOUNTER — Ambulatory Visit (HOSPITAL_BASED_OUTPATIENT_CLINIC_OR_DEPARTMENT_OTHER): Payer: No Typology Code available for payment source | Attending: Cardiology | Admitting: Cardiology

## 2017-12-22 VITALS — Ht 66.0 in | Wt 250.0 lb

## 2017-12-22 DIAGNOSIS — G4733 Obstructive sleep apnea (adult) (pediatric): Secondary | ICD-10-CM | POA: Insufficient documentation

## 2017-12-22 DIAGNOSIS — R0683 Snoring: Secondary | ICD-10-CM

## 2017-12-22 DIAGNOSIS — R0600 Dyspnea, unspecified: Secondary | ICD-10-CM | POA: Diagnosis present

## 2017-12-22 DIAGNOSIS — I471 Supraventricular tachycardia: Secondary | ICD-10-CM | POA: Insufficient documentation

## 2017-12-22 NOTE — Procedures (Signed)
Patient Name: Yolanda Brown, Yolanda Brown Date: 12/22/2017 Gender: Female D.O.B: 15-Sep-1960 Age (years): 57 Referring Provider: Lyda Jester Height (inches): 59 Interpreting Physician: Fransico Him MD, ABSM Weight (lbs): 250 RPSGT: Jacolyn Reedy BMI: 40 MRN: [id number]  CLINICAL INFORMATION  Sleep Study Type: Split Night CPAP Indication for sleep study: Snoring Epworth Sleepiness Score: 11  SLEEP STUDY TECHNIQUE  As per the AASM Manual for the Scoring of Sleep and Associated Events v2.3 (April 2016) with a hypopnea requiring 4% desaturations. The channels recorded and monitored were frontal, central and occipital EEG, electrooculogram (EOG), submentalis EMG (chin), nasal and oral airflow, thoracic and abdominal wall motion, anterior tibialis EMG, snore microphone, electrocardiogram, and pulse oximetry. Continuous positive airway pressure (CPAP) was initiated when the patient met split night criteria and was titrated according to treat sleep-disordered breathing.  MEDICATIONS  Medications self-administered by patient taken the night of the study : N/A  RESPIRATORY PARAMETERS  Diagnostic Total AHI (/hr):68.7 RDI (/hr):103.0 OA Index (/hr):1.9 CA Index (/hr):0.0 REM AHI (/hr):N/A NREM AHI (/hr):68.7 Supine AHI (/hr):N/A Non-supine AHI (/hr):68.7 Min O2 Sat (%):80.0 Mean O2 (%):90.7 Time below 88% (min):28.1  Titration Optimal Pressure (cm):11 AHI at Optimal Pressure (/hr):0.0 Min O2 at Optimal Pressure (%):86.0 Supine % at Optimal (%):0 Sleep % at Optimal (%):98  SLEEP ARCHITECTURE  The recording time for the entire night was 374.8 minutes. During a baseline period of 138.8 minutes, the patient slept for 125.8 minutes in REM and nonREM, yielding a sleep efficiency of 90.6%%. Sleep onset after lights out was 1.0 minutes with a REM latency of N/A minutes. The patient spent 39.6%% of the night in stage N1 sleep, 60.4%% in stage N2 sleep, 0.0%% in stage N3 and 0% in  REM.  During the titration period of 229.1 minutes, the patient slept for 224.5 minutes in REM and nonREM, yielding a sleep efficiency of 98.0%%. Sleep onset after CPAP initiation was 0.8 minutes with a REM latency of 64.0 minutes. The patient spent 6.9%% of the night in stage N1 sleep, 71.9%% in stage N2 sleep, 0.0%% in stage N3 and 21.2% in REM.  CARDIAC DATA  The 2 lead EKG demonstrated sinus rhythm. The mean heart rate was 100.0 beats per minute. Other EKG findings include: None.  LEG MOVEMENT DATA  The total Periodic Limb Movements of Sleep (PLMS) were 0. The PLMS index was 0.0 .  IMPRESSIONS  - Severe obstructive sleep apnea occurred during the diagnostic portion of the study (AHI = 68.7/hour). An optimal PAP pressure was selected for this patient ( 11 cm of water) - No significant central sleep apnea occurred during the diagnostic portion of the study (CAI = 0.0/hour). - Moderate oxygen desaturation was noted during the diagnostic portion of the study (Min O2 =80.0%). - The patient snored with moderate snoring volume during the diagnostic portion of the study. - No cardiac abnormalities were noted during this study. - Clinically significant periodic limb movements did not occur during sleep.  DIAGNOSIS  - Obstructive Sleep Apnea (327.23 [G47.33 ICD-10])  RECOMMENDATIONS  - Trial of CPAP therapy on 11 cm H2O with a Small size Fisher&Paykel Full Face Mask Simplus mask and heated humidification. - Avoid alcohol, sedatives and other CNS depressants that may worsen sleep apnea and disrupt normal sleep architecture. - Sleep hygiene should be reviewed to assess factors that may improve sleep quality. - Weight management and regular exercise should be initiated or continued. - Return to [facility] Center for re-evaluation after 10 weeks of therapy  [Electronically signed]  12/22/2017 11:07 PM  Fransico Him MD, ABSM Diplomate, American Board of Sleep Medicine

## 2017-12-28 ENCOUNTER — Telehealth: Payer: Self-pay | Admitting: *Deleted

## 2017-12-28 NOTE — Telephone Encounter (Addendum)
Informed patient of sleep study results and patient understanding was verbalized. Patient understands her sleep study showed they have significant  sleep apnea and had successful PAP titration and will be set up with PAP unit.  Order are in EPIC. We will get an overnight pulse ox on PAP therapy also. Upon patient request DME selection is [facility].Marland Kitchen Patient understands she will be contacted by [facility] to set up her cpap. Patient understands to call if [facility] does not contact her with new setup in a timely manner. Patient understands they will be called once confirmation has been received from [facility] that they have received their new machine to schedule 10 week follow up appointment.  [facility] notified of new cpap order  Please add to airview Patient was grateful for the call and thanked me.

## 2017-12-28 NOTE — Telephone Encounter (Signed)
-----   Message from Quintella Reichert, MD sent at 12/22/2017 11:11 PM EDT ----- Please let patient know that they have significant sleep apnea and had successful PAP titration and will be set up with PAP unit.  Please let DME know that order is in EPIC.  Please set patient up for OV in 10 weeks.  Please get an overnight pulse ox on PAP therapy

## 2017-12-31 DIAGNOSIS — R638 Other symptoms and signs concerning food and fluid intake: Secondary | ICD-10-CM | POA: Insufficient documentation

## 2018-01-02 DIAGNOSIS — R110 Nausea: Secondary | ICD-10-CM | POA: Insufficient documentation

## 2018-01-07 ENCOUNTER — Encounter: Payer: Self-pay | Admitting: Internal Medicine

## 2018-01-07 ENCOUNTER — Ambulatory Visit (INDEPENDENT_AMBULATORY_CARE_PROVIDER_SITE_OTHER): Payer: PRIVATE HEALTH INSURANCE | Admitting: Internal Medicine

## 2018-01-07 ENCOUNTER — Encounter: Payer: Self-pay | Admitting: Gastroenterology

## 2018-01-07 VITALS — BP 126/84 | HR 93 | Ht 66.0 in | Wt 251.0 lb

## 2018-01-07 DIAGNOSIS — R05 Cough: Secondary | ICD-10-CM

## 2018-01-07 DIAGNOSIS — Z87898 Personal history of other specified conditions: Secondary | ICD-10-CM

## 2018-01-07 DIAGNOSIS — R0600 Dyspnea, unspecified: Secondary | ICD-10-CM

## 2018-01-07 DIAGNOSIS — K219 Gastro-esophageal reflux disease without esophagitis: Secondary | ICD-10-CM

## 2018-01-07 DIAGNOSIS — R0609 Other forms of dyspnea: Secondary | ICD-10-CM

## 2018-01-07 DIAGNOSIS — K449 Diaphragmatic hernia without obstruction or gangrene: Secondary | ICD-10-CM

## 2018-01-07 DIAGNOSIS — R059 Cough, unspecified: Secondary | ICD-10-CM

## 2018-01-07 LAB — NITRIC OXIDE: Nitric Oxide: 17

## 2018-01-07 NOTE — Patient Instructions (Addendum)
ICD-10-CM   1. Dyspnea on exertion R06.09 Nitric oxide    CANCELED: Nitric oxide  2. History of wheezing Z87.898   3. Cough R05   4. Hiatal hernia with GERD K21.9    K44.9    Dyspnea on exertion - Plan: Nitric oxide, CANCELED: Nitric oxide History of wheezing  - likely related to weight and stiff heart muscle and physical deconditioning -Currently low probability for asthma -We discussed and agreed we will hold off on empiric inhaler trial -He contacted his primary care physician and consider a trial of Lasix -Agree weight loss would be beneficial -Refer to pulmonary rehab at [facility]  Cough Hiatal hernia with GERD  -Cough appears to be from hiatal hernia at night with acid reflux -Agree with taking Zegerid -Refer to GI doc - Dr Arty Baumgartner or [place]  Followup 3 months or sooner if needed

## 2018-01-07 NOTE — Progress Notes (Signed)
Subjective:    Patient ID: Yolanda Brown, female    DOB: [birth year], 59 y.o.   MRN: [id number]   PCP Everardo Beals, NP  HPI  IOV 01/07/2018  Chief Complaint  Patient presents with  . Pulm Consult    Referred by Herschel [place] for DOE. Per patient, she notices the DOE when walking. She recently had a sleep study done and was advised that she needed to wear a CPAP at night.     Yolanda Brown 57 y.o. 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  She says that she returned back to working at [employer] ICU approximately 1 year ago.  In the last 6 months she has noticed insidious onset of shortness of breath with exertion relieved by rest.  Class III levels.  Even walking between rooms in the ICU is difficult making her dyspneic.  In fact today when we walked 185 feet x 3 laps on room air she got quite dyspneic and tachycardic but without any desaturation.  She was even tachycardic at rest.  Symptoms are relieved by rest.  Dyspnea associated with a 30 pound weight gain in the last 1 year which she admits is due to being more sedentary and dietary indiscretion.  Hypothyroidism has been ruled out.  There is associated wheeze with the dyspnea and this happens mainly when she gets dyspneic. Also the last few months she is reporting insidious onset of dry cough.  This happens mostly at night.  Wakes her up from the night every few nights.  While the dyspnea is progressive the cough itself is stable.  Cough is mild to moderate in intensity.  She is aware of a hiatal hernia but does not know the details.  She denies any asthma or allergies but baseline use of those are shown below and slightly high for a long period of time.\   In April 2016 she suffered a lower extremity DVT related to hormone treatment.  She says that she is never had a diagnosis of PE.  In fact February 2019 and June 2019 she has had CT angiogram chest that ruled out  pulmonary embolism.  Both are associated with moderate sliding hiatal hernia.  I personally visualized the CT scan of the chest and confirmed the findings.   FenO 01/07/2018 - 17 ppb and normal  Simple office walk 185 feet x  3 laps goal with forehead probe 01/07/2018   O2 used Room air  Number laps completed 3  Comments about pace slow  Resting Pulse Ox/HR 98% and 114/min  Final Pulse Ox/HR 97% and 126/min  Desaturated </= 88% no  Desaturated <= 3% points n  Got Tachycardic >/= 90/min Yes even at rest  Symptoms at end of test Very dyspneic and stopped  Miscellaneous comments x      Exhaled nitric oxide today    Results for REMONIA, OTTE (MRN [id number]) as of 01/07/2018 11:08  Ref. Range 02/26/2007 10:00 02/26/2008 20:54 12/03/2009 12:59 07/29/2011 10:07 05/01/2012 00:40 05/01/2012 00:55 06/23/2012 21:00 05/18/2013 13:20 06/10/2013 07:29 06/29/2014 14:58 07/17/2015 14:25 08/06/2015 13:19 04/23/2017 08:35 08/31/2017 13:43  Eosinophils Absolute Latest Ref Range: 0.0 - 0.7 K/uL 0.4 0.3  0.7   0.4  0.4 0.5    0.3   \    Results for ARIANN, KHAIMOV (MRN [id number]) as of 01/07/2018 11:08  Ref. Range 08/31/2017 13:43  Creatinine Latest Ref Range: 0.44 - 1.00 mg/dL 1.21 (H)  Results for JONET, MATHIES (MRN [id number]) as of 01/07/2018 11:08  Ref. Range 08/31/2017 13:43  Hemoglobin Latest Ref Range: 12.0 - 15.0 g/dL 14.1     Echo 10/18/17  - ef 55-60%, Gr 1 diast dysfn. Unable to determine PASP   has a past medical history of Allergy, Anxiety (2013), Depression (2013), Diverticulitis (06/2013), DVT (deep vein thrombosis) in pregnancy, GERD (gastroesophageal reflux disease), Hypertension, Paroxysmal SVT (supraventricular tachycardia) ([place]), and Vitamin D deficiency.   reports that she has never smoked. She has never used smokeless tobacco.  Past Surgical History:  Procedure Laterality Date  . ABDOMINAL HYSTERECTOMY     fibroids, still has ovaries  . BREAST BIOPSY Left 2012   stereo/neg  .  CHOLECYSTECTOMY    . COLONOSCOPY  2011   [place], [place]; polyp and diverticulosis  . WISDOM TOOTH EXTRACTION      Allergies  Allergen Reactions  . Aspirin Hives    Immunization History  Administered Date(s) Administered  . Hepatitis B 01/24/1992, 02/28/1992, 11/05/1992, 09/03/2005  . Influenza Split 12/16/2014  . Influenza-Unspecified 12/03/2015  . MMR 03/22/2006  . PPD Test 09/24/2014, 10/06/2014, 10/06/2014  . Tdap 07/02/2009  . Varicella 09/03/2005    Family History  Problem Relation Age of Onset  . Diabetes Mother   . Heart disease Mother   . Cancer Father        prostate, mets  . Diabetes Father   . Hypertension Sister   . Diabetes Sister   . Stroke Sister   . Hypertension Brother   . Obesity Brother   . Hypertension Brother   . Other Brother        died of sepsis s/p perforated bowel  . Obesity Brother   . Heart disease Sister   . Hypertension Sister   . Diabetes Sister   . Hypertension Other   . Diabetes Other   . Cancer Paternal Uncle        13 uncles all died with cancer, various types     Current Outpatient Medications:  .  ALPRAZolam (XANAX) 0.5 MG tablet, take 1 TABLET BY MOUTH TWICE DAILY AS NEEDED for anxiety, Disp: 20 tablet, Rfl: 0 .  amLODipine (NORVASC) 5 MG tablet, Take 1 tablet (5 mg total) by mouth daily., Disp: 30 tablet, Rfl: 0 .  metoprolol succinate (TOPROL-XL) 50 MG 24 hr tablet, Take 1 tablet (50 mg total) by mouth daily. MAY TAKE EXTRA TABLET FOR SVT EPISODE AS NEEDED, Disp: 90 tablet, Rfl: 3 .  Multiple Vitamin (MULITIVITAMIN WITH MINERALS) TABS, Take 1 tablet by mouth every morning. , Disp: , Rfl:  .  Omeprazole-Sodium Bicarbonate (ZEGERID OTC PO), Take 1 tablet by mouth daily., Disp: , Rfl:  .  triamterene-hydrochlorothiazide (MAXZIDE-25) 37.5-25 MG tablet, take 1 TABLET BY MOUTH EVERY DAY, Disp: 30 tablet, Rfl: 0  Review of Systems  Constitutional: Negative for fever and unexpected weight change.  HENT: Negative for congestion,  dental problem, ear pain, nosebleeds, postnasal drip, rhinorrhea, sinus pressure, sneezing, sore throat and trouble swallowing.   Eyes: Negative for redness and itching.  Respiratory: Positive for shortness of breath. Negative for cough, chest tightness and wheezing.   Cardiovascular: Negative for palpitations and leg swelling.  Gastrointestinal: Negative for nausea and vomiting.  Genitourinary: Negative for dysuria.  Musculoskeletal: Negative for joint swelling.  Skin: Negative for rash.  Allergic/Immunologic: Negative.  Negative for environmental allergies, food allergies and immunocompromised state.  Neurological: Negative for headaches.  Hematological: Does not bruise/bleed easily.  Psychiatric/Behavioral: Negative for dysphoric  mood. The patient is not nervous/anxious.        Objective:   Physical Exam  Constitutional: She is oriented to person, place, and time. She appears well-developed and well-nourished. No distress.  Obese morbid  HENT:  Head: Normocephalic and atraumatic.  Right Ear: External ear normal.  Left Ear: External ear normal.  Mouth/Throat: Oropharynx is clear and moist. No oropharyngeal exudate.  M,allampatti class 2-3  Eyes: Pupils are equal, round, and reactive to light. Conjunctivae and EOM are normal. Right eye exhibits no discharge. Left eye exhibits no discharge. No scleral icterus.  Neck: Normal range of motion. Neck supple. No JVD present. No tracheal deviation present. No thyromegaly present.  Cardiovascular: Normal rate, regular rhythm, normal heart sounds and intact distal pulses. Exam reveals no gallop and no friction rub.  No murmur heard. Pulmonary/Chest: Effort normal and breath sounds normal. No respiratory distress. She has no wheezes. She has no rales. She exhibits no tenderness.  No wheezing even when dyspenic  Abdominal: Soft. Bowel sounds are normal. She exhibits no distension and no mass. There is no tenderness. There is no rebound and no  guarding.  Musculoskeletal: Normal range of motion. She exhibits no edema or tenderness.  Lymphadenopathy:    She has no cervical adenopathy.  Neurological: She is alert and oriented to person, place, and time. She has normal reflexes. No cranial nerve deficit. She exhibits normal muscle tone. Coordination normal.  Skin: Skin is warm and dry. No rash noted. She is not diaphoretic. No erythema. No pallor.  Psychiatric: She has a normal mood and affect. Her behavior is normal. Judgment and thought content normal.  Vitals reviewed.    Vitals:   01/07/18 1049  BP: 126/84  Pulse: 93  SpO2: 94%  Weight: 251 lb (113.9 kg)  Height: _0  (1.676 m)    Estimated body mass index is 40.51 kg/m as calculated from the following:   Height as of this encounter: _1  (1.676 m).   Weight as of this encounter: 251 lb (113.9 kg).       Assessment & Plan:     ICD-10-CM   1. Dyspnea on exertion R06.09 Nitric oxide    CANCELED: Nitric oxide  2. History of wheezing Z87.898   3. Cough R05   4. Hiatal hernia with GERD K21.9    K44.9     Patient Instructions     ICD-10-CM   1. Dyspnea on exertion R06.09 Nitric oxide    CANCELED: Nitric oxide  2. History of wheezing Z87.898   3. Cough R05   4. Hiatal hernia with GERD K21.9    K44.9    Dyspnea on exertion - Plan: Nitric oxide, CANCELED: Nitric oxide History of wheezing  - likely related to weight and stiff heart muscle and physical deconditioning -Currently low probability for asthma -We discussed and agreed we will hold off on empiric inhaler trial -He contacted his primary care physician and consider a trial of Lasix -Agree weight loss would be beneficial -Refer to pulmonary rehab at [facility]  Cough Hiatal hernia with GERD  -Cough appears to be from hiatal hernia at night with acid reflux -Agree with taking Zegerid -Refer to GI doc - Dr Verdia Kuba or [place]  Followup 3 months or sooner if needed      SIGNATURE      Dr. Brand Males, M.D., F.C.C.P,  Pulmonary and Critical Care Medicine Staff Physician, [facility] Director - Interstitial Lung Disease  Program  [facility] [place], [place], [address]  Pager: [phone], If no answer or between  15:00h - 7:00h: call [phone] Telephone: [phone]  11:44 AM 01/07/2018

## 2018-01-07 NOTE — Addendum Note (Signed)
Addended by: Kerin Ransom on: 01/07/2018 11:51 AM   Modules accepted: Orders

## 2018-01-10 ENCOUNTER — Telehealth: Payer: Self-pay | Admitting: Cardiology

## 2018-01-10 NOTE — Telephone Encounter (Signed)
  When she was given her sleep study results she was told someone would get in touch with her regarding a CPAP set up. She has not heard anything and would like to know what the next step is.

## 2018-01-10 NOTE — Telephone Encounter (Signed)
Reached out to the patient and informed her that the insurance needs 15 business days to make a decision to approve or deny her cpap. Pt is aware and agreeable to this treatment.

## 2018-02-08 ENCOUNTER — Ambulatory Visit: Payer: PRIVATE HEALTH INSURANCE | Admitting: Gastroenterology

## 2018-02-09 ENCOUNTER — Telehealth (HOSPITAL_COMMUNITY): Payer: Self-pay

## 2018-02-09 NOTE — Telephone Encounter (Signed)
Attempted to contact pt in regards to PR, left voicemail.

## 2018-02-15 ENCOUNTER — Encounter (HOSPITAL_COMMUNITY): Payer: Self-pay

## 2018-02-15 NOTE — Telephone Encounter (Signed)
Attempted to contact pt in regards to Pulmonary rehab. LMTCB  Mailed letter

## 2018-02-24 NOTE — Telephone Encounter (Signed)
3rd attempted to contact pt in regards to Pulmonary Rehab.  LMTCB

## 2018-03-23 ENCOUNTER — Ambulatory Visit
Admission: EM | Admit: 2018-03-23 | Discharge: 2018-03-23 | Disposition: A | Discharge status: Home / Self Care | Payer: Self-pay | Attending: Physician Assistant | Admitting: Physician Assistant

## 2018-03-23 ENCOUNTER — Encounter: Payer: Self-pay | Admitting: Emergency Medicine

## 2018-03-23 ENCOUNTER — Telehealth (HOSPITAL_COMMUNITY): Payer: Self-pay

## 2018-03-23 DIAGNOSIS — M545 Low back pain, unspecified: Secondary | ICD-10-CM

## 2018-03-23 MED ORDER — METHYLPREDNISOLONE SODIUM SUCC 125 MG IJ SOLR
125.0000 mg | Freq: Once | INTRAMUSCULAR | Status: AC
  Administered 2018-03-23: 125 mg via INTRAMUSCULAR

## 2018-03-23 MED ORDER — METHOCARBAMOL 500 MG PO TABS: 500.0000 mg | ORAL_TABLET | Freq: Two times a day (BID) | ORAL | 0 refills | Status: DC

## 2018-03-23 MED ORDER — DICLOFENAC SODIUM 75 MG PO TBEC: 75.0000 mg | DELAYED_RELEASE_TABLET | Freq: Two times a day (BID) | ORAL | 0 refills | Status: DC

## 2018-03-23 MED ORDER — KETOROLAC TROMETHAMINE 60 MG/2ML IM SOLN
60.0000 mg | Freq: Once | INTRAMUSCULAR | Status: AC
  Administered 2018-03-23: 60 mg via INTRAMUSCULAR

## 2018-03-23 NOTE — Telephone Encounter (Signed)
No response from pt in regards to Pulmonary rehab.  Closed referral

## 2018-03-23 NOTE — ED Provider Notes (Signed)
[facility]    CSN: [id number] Arrival date & time: 03/23/18  1044     History   Chief Complaint Chief Complaint  Patient presents with  . Back Pain    HPI Yolanda Brown is a 58 y.o. female.   The history is provided by the patient. No language interpreter was used.  Back Pain  Location:  Lumbar spine and thoracic spine Quality:  Aching Pain severity:  Moderate Pain is:  Same all the time Timing:  Constant Progression:  Worsening Chronicity:  New Relieved by:  Nothing Worsened by:  Nothing Ineffective treatments:  None tried Associated symptoms: no numbness, no paresthesias and no weakness     Past Medical History:  Diagnosis Date  . Allergy   . Anxiety 2013   has seen Dr. Evelene CroonKaur prior  . Depression 2013   Dr. Evelene CroonKaur prior  . Diverticulitis 06/2013   3 total episodes  . DVT (deep vein thrombosis) in pregnancy   . GERD (gastroesophageal reflux disease)   . Hypertension   . Paroxysmal SVT (supraventricular tachycardia) ([facility])    Echo 8/19:  EF 55-60, Gr 1 DD, normal RVSF  . Vitamin D deficiency     Patient Active Problem List   Diagnosis Date Noted  . Obesity 08/06/2015  . Impaired fasting blood sugar 08/06/2015  . Routine general medical examination at a health care facility 08/06/2015  . Vaccine counseling 08/06/2015  . Screening for breast cancer 08/06/2015  . Chronic fatigue 08/06/2015  . Vitamin D deficiency 08/06/2015  . Non-restorative sleep 08/06/2015  . Snoring 08/06/2015  . Daytime somnolence 08/06/2015  . Anxiety and depression 01/25/2015  . History of DVT (deep vein thrombosis) 01/25/2015  . Essential hypertension 01/25/2015    Past Surgical History:  Procedure Laterality Date  . ABDOMINAL HYSTERECTOMY     fibroids, still has ovaries  . BREAST BIOPSY Left 2012   stereo/neg  . CHOLECYSTECTOMY    . COLONOSCOPY  2011   [place], [place]; polyp and diverticulosis  . WISDOM TOOTH EXTRACTION      OB History   No obstetric  history on file.      Home Medications    Prior to Admission medications   Medication Sig Start Date End Date Taking? Authorizing Provider  ALPRAZolam Prudy Feeler(XANAX) 0.5 MG tablet take 1 TABLET BY MOUTH TWICE DAILY AS NEEDED for anxiety 11/13/16   Tysinger, Kermit Baloavid S, PA-C  amLODipine (NORVASC) 5 MG tablet Take 1 tablet (5 mg total) by mouth daily. 11/13/16   Tysinger, Kermit Baloavid S, PA-C  metoprolol succinate (TOPROL-XL) 50 MG 24 hr tablet Take 1 tablet (50 mg total) by mouth daily. MAY TAKE EXTRA TABLET FOR SVT EPISODE AS NEEDED 10/13/17 01/11/18  Robbie LisSimmons, Brittainy M, PA-C  Multiple Vitamin (MULITIVITAMIN WITH MINERALS) TABS Take 1 tablet by mouth every morning.     [provider]  Omeprazole-Sodium Bicarbonate (ZEGERID OTC PO) Take 1 tablet by mouth daily.    [provider]  triamterene-hydrochlorothiazide (MAXZIDE-25) 37.5-25 MG tablet take 1 TABLET BY MOUTH EVERY DAY 11/16/16   Tysinger, Kermit Baloavid S, PA-C    Family History Family History  Problem Relation Age of Onset  . Diabetes Mother   . Heart disease Mother   . Cancer Father        prostate, mets  . Diabetes Father   . Hypertension Sister   . Diabetes Sister   . Stroke Sister   . Hypertension Brother   . Obesity Brother   . Hypertension Brother   .  Other Brother        died of sepsis s/p perforated bowel  . Obesity Brother   . Heart disease Sister   . Hypertension Sister   . Diabetes Sister   . Hypertension Other   . Diabetes Other   . Cancer Paternal Uncle        13 uncles all died with cancer, various types    Social History Social History   Tobacco Use  . Smoking status: Never Smoker  . Smokeless tobacco: Never Used  Substance Use Topics  . Alcohol use: Yes    Alcohol/week: 2.0 standard drinks    Types: 2 Glasses of wine per week    Comment: rare  . Drug use: No     Allergies   Aspirin   Review of Systems Review of Systems  Musculoskeletal: Positive for back pain.  Neurological: Negative for  weakness, numbness and paresthesias.  All other systems reviewed and are negative.    Physical Exam Triage Vital Signs ED Triage Vitals  Enc Vitals Group     BP      Pulse      Resp      Temp      Temp src      SpO2      Weight      Height      Head Circumference      Peak Flow      Pain Score      Pain Loc      Pain Edu?      Excl. in GC?    No data found.  Updated Vital Signs There were no vitals taken for this visit.  Visual Acuity Right Eye Distance:   Left Eye Distance:   Bilateral Distance:    Right Eye Near:   Left Eye Near:    Bilateral Near:     Physical Exam Vitals signs reviewed.  Constitutional:      Appearance: Normal appearance.  HENT:     Head: Normocephalic and atraumatic.     Mouth/Throat:     Mouth: Mucous membranes are moist.  Eyes:     Pupils: Pupils are equal, round, and reactive to light.  Neck:     Musculoskeletal: Normal range of motion.  Cardiovascular:     Rate and Rhythm: Normal rate.     Pulses: Normal pulses.  Pulmonary:     Effort: Pulmonary effort is normal.  Abdominal:     General: Abdomen is flat.  Musculoskeletal:        General: Tenderness present.     Comments: Tender diffuse spine from nv and ns intact   Skin:    General: Skin is warm.  Neurological:     General: No focal deficit present.     Mental Status: She is alert.  Psychiatric:        Mood and Affect: Mood normal.      UC Treatments / Results  Labs (all labs ordered are listed, but only abnormal results are displayed) Labs Reviewed - No data to display  EKG None  Radiology No results found.  Procedures Procedures (including critical care time)  Medications Ordered in UC Medications  ketorolac (TORADOL) injection 60 mg (has no administration in time range)  methylPREDNISolone sodium succinate (SOLU-MEDROL) 125 mg/2 mL injection 125 mg (has no administration in time range)    Initial Impression / Assessment and Plan / UC Course  I  have reviewed the triage vital signs and the nursing notes.  Pertinent labs & imaging results that were available during my care of the patient were reviewed by me and considered in my medical decision making (see chart for details).    Pt given solumedrol and torodol   Final Clinical Impressions(s) / UC Diagnoses   Final diagnoses:  Acute low back pain, unspecified back pain laterality, unspecified whether sciatica present   Discharge Instructions   None    ED Prescriptions    Medication Sig Dispense Auth. Provider   diclofenac (VOLTAREN) 75 MG EC tablet Take 1 tablet (75 mg total) by mouth 2 (two) times daily. 20 tablet ,  K, [place]   methocarbamol (ROBAXIN) 500 MG tablet Take 1 tablet (500 mg total) by mouth 2 (two) times daily. 20 tablet [place] Areas, [place]     Controlled Substance Prescriptions [place] Controlled Substance Registry consulted? Not Applicable   Elson Areas, [place] 03/23/18 1132

## 2018-03-23 NOTE — ED Triage Notes (Signed)
[facility], acute on chronic in nature.  States this flare up began yesterday, but she has had problems "for years".  C/o neck pain, lower back pain, radiation down her left leg.

## 2018-03-23 NOTE — ED Notes (Signed)
Patient able to ambulate independently  

## 2018-07-04 ENCOUNTER — Telehealth: Payer: Self-pay | Admitting: Cardiology

## 2018-07-04 NOTE — Telephone Encounter (Signed)
New message     Virtual video visit scheduled for 07-08-18.  Pt states that she sometimes has SOB and wanted the NP to know.  Pt gave consent for visit along with new ins information.  YOUR CARDIOLOGY TEAM HAS ARRANGED FOR AN E-VISIT FOR YOUR APPOINTMENT - PLEASE REVIEW IMPORTANT INFORMATION BELOW SEVERAL DAYS PRIOR TO YOUR APPOINTMENT  Due to the recent COVID-19 pandemic, we are transitioning in-person office visits to tele-medicine visits in an effort to decrease unnecessary exposure to our patients, their families, and staff. These visits are billed to your insurance just like a normal visit is. We also encourage you to sign up for MyChart if you have not already done so. You will need a smartphone if possible. For patients that do not have this, we can still complete the visit using a regular telephone but do prefer a smartphone to enable video when possible. You may have a family member that lives with you that can help. If possible, we also ask that you have a blood pressure cuff and scale at home to measure your blood pressure, heart rate and weight prior to your scheduled appointment. Patients with clinical needs that need an in-person evaluation and testing will still be able to come to the office if absolutely necessary. If you have any questions, feel free to call our office.     YOUR PROVIDER WILL BE USING THE FOLLOWING PLATFORM TO COMPLETE YOUR VISIT: Doximity   IF USING MYCHART - How to Download the MyChart App to Your SmartPhone   - If Apple, go to [employer] and type in MyChart in the search bar and download the app. If Android, ask patient to go to [employer] and type in [place] in the search bar and download the app. The app is free but as with any other app downloads, your phone may require you to verify saved payment information or Apple/Android password.  - You will need to then log into the app with your MyChart username and password, and select [facility] as your  healthcare provider to link the account.  - When it is time for your visit, go to the MyChart app, find appointments, and click Begin Video Visit. Be sure to Select Allow for your device to access the Microphone and Camera for your visit. You will then be connected, and your provider will be with you shortly.  **If you have any issues connecting or need assistance, please contact MyChart service desk [phone])**  **If using a computer, in order to ensure the best quality for your visit, you will need to use either of the following Internet Browsers: Agricultural consultant or Microsoft Edge**   IF USING DOXIMITY or [email or web address] - The staff will give you instructions on receiving your link to join the meeting the day of your visit.      2-3 DAYS BEFORE YOUR APPOINTMENT  You will receive a telephone call from one of our HeartCare team members - your caller ID may say "Unknown caller." If this is a video visit, we will walk you through how to get the video launched on your phone. We will remind you check your blood pressure, heart rate and weight prior to your scheduled appointment. If you have an Apple Watch or Kardia, please upload any pertinent ECG strips the day before or morning of your appointment to MyChart. Our staff will also make sure you have reviewed the consent and agree to move forward with your scheduled tele-health visit.  THE DAY OF YOUR APPOINTMENT  Approximately 15 minutes prior to your scheduled appointment, you will receive a telephone call from one of [facility] team - your caller ID may say "Unknown caller."  Our staff will confirm medications, vital signs for the day and any symptoms you may be experiencing. Please have this information available prior to the time of visit start. It may also be helpful for you to have a pad of paper and pen handy for any instructions given during your visit. They will also walk you through joining the smartphone meeting if this is a  video visit.    CONSENT FOR TELE-HEALTH VISIT - PLEASE REVIEW  I hereby voluntarily request, consent and authorize [facility] and its employed or contracted physicians, physician assistants, nurse practitioners or other licensed health care professionals (the Practitioner), to provide me with telemedicine health care services (the Services") as deemed necessary by the treating Practitioner. I acknowledge and consent to receive the Services by the Practitioner via telemedicine. I understand that the telemedicine visit will involve communicating with the Practitioner through live audiovisual communication technology and the disclosure of certain medical information by electronic transmission. I acknowledge that I have been given the opportunity to request an in-person assessment or other available alternative prior to the telemedicine visit and am voluntarily participating in the telemedicine visit.  I understand that I have the right to withhold or withdraw my consent to the use of telemedicine in the course of my care at any time, without affecting my right to future care or treatment, and that the Practitioner or I may terminate the telemedicine visit at any time. I understand that I have the right to inspect all information obtained and/or recorded in the course of the telemedicine visit and may receive copies of available information for a reasonable fee.  I understand that some of the potential risks of receiving the Services via telemedicine include:   Delay or interruption in medical evaluation due to technological equipment failure or disruption;  Information transmitted may not be sufficient (e.g. poor resolution of images) to allow for appropriate medical decision making by the Practitioner; and/or   In rare instances, security protocols could fail, causing a breach of personal health information.  Furthermore, I acknowledge that it is my responsibility to provide information about my  medical history, conditions and care that is complete and accurate to the best of my ability. I acknowledge that Practitioner's advice, recommendations, and/or decision may be based on factors not within their control, such as incomplete or inaccurate data provided by me or distortions of diagnostic images or specimens that may result from electronic transmissions. I understand that the practice of medicine is not an exact science and that Practitioner makes no warranties or guarantees regarding treatment outcomes. I acknowledge that I will receive a copy of this consent concurrently upon execution via email to the email address I last provided but may also request a printed copy by calling the office of [facility].    I understand that my insurance will be billed for this visit.   I have read or had this consent read to me.  I understand the contents of this consent, which adequately explains the benefits and risks of the Services being provided via telemedicine.   I have been provided ample opportunity to ask questions regarding this consent and the Services and have had my questions answered to my satisfaction.  I give my informed consent for the services to be provided through the  use of telemedicine in my medical care  By participating in this telemedicine visit I agree to the above.

## 2018-07-07 NOTE — Progress Notes (Signed)
Virtual Visit via Video Note   This visit type was conducted due to national recommendations for restrictions regarding the COVID-19 Pandemic (e.g. social distancing) in an effort to limit this patient's exposure and mitigate transmission in our community.  Due to her co-morbid illnesses, this patient is at least at moderate risk for complications without adequate follow up.  This format is felt to be most appropriate for this patient at this time.  All issues noted in this document were discussed and addressed.  A limited physical exam was performed with this format.  Please refer to the patient's chart for her consent to telehealth for [facility].   Date:  07/08/2018      ID:  Yolanda Brown, DOB 03/29/1960, MRN [id number]  Patient Location: Home Provider Location: Office  PCP:  Marva PandaMillsaps, Kimberly, NP  Cardiologist:  Donato SchultzMark Skains, MD  Electrophysiologist:  None   Evaluation Performed:  Follow-Up Visit  Chief Complaint:  DOE  History of Present Illness:    Yolanda Brown is a 58 y.o. female with SVT and increased DOE.  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 Her PMH is notable for provoked DVT in 2016, in the setting of HRT. She was treated with Xarelto x 6 months. Also has a h/o HTN, prediabetes and obesity. She reports history of snoring and fatigue during wake hours we ordered a sleep study that was + but her insurance would not pay for CPAP.  She now has new insurance and would like to be on CPAP.   She notes a FH of heart disease. Her mother died with CHF at age 60. She is unsure if she had coronary disease. Her sister also had CHF but had other medical problems and died from noncardiac causes. She denies any h/o tobacco use.   She was seen for SVT - 3 episodes over 3 months.  Each episode has occurred at rest.  She notes sudden development of tachypalpitations with heart rates reaching the 160s and 170s. She  feels SOB and dizzy. She denies any associated CP. 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  She reports that she was given labetalol the first episode which reduced her heart rate and put her back in normal sinus rhythm.  The second and third episodes both broke with vagal maneuvers.  Her third episode, she was [facility] in [facility].  Laboratory work including TSH, electrolytes and CBC were all within normal limits.  UA was also negative.  Given her recurrent arrhythmia, she was referred to our office for further management and evaluation.  toprol XL 50 mg was started.  And an extra tablet if break through SVT.   Discussed the importance of avoiding triggers such as excess caffeine and alcohol.    Echo was done EF 55-60% G1DD, no valvular abnormalities  Had sleep study and was to receive CPAP but her insurance would not pay.    Today she has had no further episodes of SVT.  Her BP is elevated.  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  She has been more DOE especially at work wearing N95 masks.  No chest pain, but she is concerned with family hx of heart issues.  Her BP is up as well.  Some may be related to stress of caring for COVID pts, she  has sleep apnea but no CPAP will arrange for CPAP now.  She can hardly walk up and down stairs due to dyspnea.  We also discussed her obesity and discussed wt loss centers, there is one in [place] by [facility] that she could go with less cost.  Also discussed the mask and how it makes [place] more aware that we are SOB, and need for exercise, start with 5 min twice a day.  And work up to 10 min twice a day.     The patient does not have symptoms concerning for COVID-19 infection (fever, chills, cough, or new shortness of breath).    Past Medical History:  Diagnosis Date  . Allergy   . Anxiety 2013   has seen Dr. Evelene Croon prior  . Depression 2013   Dr. Evelene Croon prior  . Diverticulitis  06/2013   3 total episodes  . DVT (deep vein thrombosis) in pregnancy   . GERD (gastroesophageal reflux disease)   . Hypertension   . Paroxysmal SVT (supraventricular tachycardia) (HCC)    Echo 8/19:  EF 55-60, Gr 1 DD, normal RVSF  . Vitamin D deficiency    Past Surgical History:  Procedure Laterality Date  . ABDOMINAL HYSTERECTOMY     fibroids, still has ovaries  . BREAST BIOPSY Left 2012   stereo/neg  . CHOLECYSTECTOMY    . COLONOSCOPY  2011   [place], [place]; polyp and diverticulosis  . WISDOM TOOTH EXTRACTION       Current Meds  Medication Sig  . ALPRAZolam (XANAX) 0.5 MG tablet take 1 TABLET BY MOUTH TWICE DAILY AS NEEDED for anxiety  . amLODipine (NORVASC) 5 MG tablet Take 1 tablet (5 mg total) by mouth daily.  . Multiple Vitamin (MULITIVITAMIN WITH MINERALS) TABS Take 1 tablet by mouth every morning.   [name] Bicarbonate (ZEGERID OTC PO) Take 1 tablet by mouth daily.  Marland Kitchen triamterene-hydrochlorothiazide (MAXZIDE-25) 37.5-25 MG tablet Take 1 tablet by mouth daily.  . [DISCONTINUED] amLODipine (NORVASC) 5 MG tablet Take 1 tablet (5 mg total) by mouth daily.  . [DISCONTINUED] triamterene-hydrochlorothiazide (MAXZIDE-25) 37.5-25 MG tablet take 1 TABLET BY MOUTH EVERY DAY     Allergies:   Aspirin   Social History   Tobacco Use  . Smoking status: Never Smoker  . Smokeless tobacco: Never Used  Substance Use Topics  . Alcohol use: Yes    Alcohol/week: 2.0 standard drinks    Types: 2 Glasses of wine per week    Comment: rare  . Drug use: No     Family Hx: The patient's family history includes Cancer in her father and paternal uncle; Diabetes in her father, mother, sister, sister, and another family member; Heart disease in her mother and sister; Hypertension in her brother, brother, sister, sister, and another family member; Obesity in her brother and brother; Other in her brother; Stroke in her sister.  ROS:   Please see the history of present illness.     General:no colds or fevers, + weight gain Skin:no rashes or ulcers HEENT:no blurred vision, no congestion CV:see HPI PUL:see HPI GI:no diarrhea constipation or melena, no indigestion GU:no hematuria, no dysuria MS:no joint pain, no claudication Neuro:no syncope, no lightheadedness Endo:no diabetes, no thyroid disease  All other systems reviewed and are negative.   Prior CV studies:   The following studies were reviewed today:  Echo 10/18/17 Study Conclusions  - Left ventricle: The cavity size was normal. Wall thickness was   normal. Systolic function was normal. The estimated ejection  fraction was in the range of 55% to 60%. Although no diagnostic   regional wall motion abnormality was identified, this possibility   cannot be completely excluded on the basis of this study. Doppler   parameters are consistent with abnormal left ventricular   relaxation (grade 1 diastolic dysfunction). - Aortic valve: There was no stenosis. - Mitral valve: There was no significant regurgitation. - Right ventricle: The cavity size was normal. Systolic function   was normal. - Pulmonary arteries: No complete TR doppler jet so unable to   estimate PA systolic pressure. - Systemic veins: IVC not visualized.  Impressions:  - Normal LV size with EF 55-60%. Normal RV size and systolic   function. No significant valvular abnormalities.   Labs/Other Tests and Data Reviewed:    EKG:  An ECG dated 08/31/17 was personally reviewed today and demonstrated:  normal SR improved from EKG earlier of SVT  Recent Labs: 08/31/2017: ALT 34; BUN 20; Creatinine, Ser 1.21; Hemoglobin 14.1; Platelets 326; Potassium 3.7; Sodium 139; TSH 2.854   Recent Lipid Panel Lab Results  Component Value Date/Time   CHOL 175 08/06/2015 01:19 PM   TRIG 193 (H) 08/06/2015 01:19 PM   HDL 37 (L) 08/06/2015 01:19 PM   CHOLHDL 4.7 08/06/2015 01:19 PM   LDLCALC 99 08/06/2015 01:19 PM    Wt Readings from Last 3  Encounters:  07/08/18 265 lb (120.2 kg)  01/07/18 251 lb (113.9 kg)  12/22/17 250 lb (113.4 kg)     Objective:    Vital Signs:  BP (!) 140/92   Pulse 86   Ht  (1.676 m)   Wt 265 lb (120.2 kg)   BMI 42.77 kg/m    VITAL SIGNS:  reviewed  General:  Awake female in no acute distress Neuro alert and oriented X 3 MAE, follows commands Lungs can complete sentences without SOB Psych: pleasant affect  ASSESSMENT & PLAN:    1. DOE -she had this at last visit but has increased.  Does have FH of CAD.  Her echo with normal EF.  Pt is working with high stress, her BP is elevated and not been able to begin CPAP.  She has seen pulmonary and told she did not have asthma.  Will plan on lexiscan myoview  This could be anginal equivalent-- Though cardiac CTA may give more clear results.  Will discuss with Dr. Anne Fu and if COVID testing needs to be done prior to procedure.  2. SVT no further episodes on BB 3. HTN, will increase toprol to 75 mg daily, this may help with dyspnea as well  She will call in her BP next week on higher dose.  4. Morbid Obesity she is aware of need to lose wt, gave name and number for [facility] weight management system.  5. OSA need for CPAP will send to sleep team need for CPAP and has new insurance carrier. Sleep study was in OCT.   COVID-19 Education: The signs and symptoms of COVID-19 were discussed with the patient and how to seek care for testing (follow up with PCP or arrange E-visit).  The importance of social distancing was discussed today.  Time:   Today, I have spent 15 minutes with the patient with telehealth technology discussing the above problems.     Medication Adjustments/Labs and Tests Ordered: Current medicines are reviewed at length with the patient today.  Concerns regarding medicines are outlined above.   Tests Ordered: Orders Placed This Encounter  Procedures  . MYOCARDIAL PERFUSION  IMAGING    Medication Changes: Meds ordered this encounter   Medications  . amLODipine (NORVASC) 5 MG tablet    Sig: Take 1 tablet (5 mg total) by mouth daily.    Dispense:  90 tablet    Refill:  3  . triamterene-hydrochlorothiazide (MAXZIDE-25) 37.5-25 MG tablet    Sig: Take 1 tablet by mouth daily.    Dispense:  90 tablet    Refill:  3  . metoprolol succinate (TOPROL XL) 25 MG 24 hr tablet    Sig: Take 3 tablets (75 mg total) by mouth daily.    Dispense:  90 tablet    Refill:  2    Disposition:  Follow up in 2 month(s)  Signed, Nada Boozer, NP  07/08/2018 11:30 AM    [facility]

## 2018-07-08 ENCOUNTER — Encounter: Payer: Self-pay | Admitting: *Deleted

## 2018-07-08 ENCOUNTER — Other Ambulatory Visit: Payer: Self-pay

## 2018-07-08 ENCOUNTER — Telehealth (INDEPENDENT_AMBULATORY_CARE_PROVIDER_SITE_OTHER): Payer: PRIVATE HEALTH INSURANCE | Admitting: Cardiology

## 2018-07-08 ENCOUNTER — Encounter: Payer: Self-pay | Admitting: Cardiology

## 2018-07-08 ENCOUNTER — Telehealth: Payer: Self-pay | Admitting: *Deleted

## 2018-07-08 VITALS — BP 140/92 | HR 86 | Ht 66.0 in | Wt 265.0 lb

## 2018-07-08 DIAGNOSIS — I471 Supraventricular tachycardia: Secondary | ICD-10-CM

## 2018-07-08 DIAGNOSIS — R0609 Other forms of dyspnea: Secondary | ICD-10-CM

## 2018-07-08 DIAGNOSIS — G4733 Obstructive sleep apnea (adult) (pediatric): Secondary | ICD-10-CM

## 2018-07-08 DIAGNOSIS — R079 Chest pain, unspecified: Secondary | ICD-10-CM

## 2018-07-08 DIAGNOSIS — I10 Essential (primary) hypertension: Secondary | ICD-10-CM

## 2018-07-08 DIAGNOSIS — R0600 Dyspnea, unspecified: Secondary | ICD-10-CM

## 2018-07-08 MED ORDER — AMLODIPINE BESYLATE 5 MG PO TABS: 5.0000 mg | ORAL_TABLET | Freq: Every day | ORAL | 3 refills | Status: DC

## 2018-07-08 MED ORDER — METOPROLOL SUCCINATE ER 25 MG PO TB24: 75.0000 mg | ORAL_TABLET | Freq: Every day | ORAL | 2 refills | Status: DC

## 2018-07-08 MED ORDER — TRIAMTERENE-HCTZ 37.5-25 MG PO TABS: 1.0000 | ORAL_TABLET | Freq: Every day | ORAL | 3 refills | Status: DC

## 2018-07-08 NOTE — Telephone Encounter (Signed)
-----   Message from Elliot Cousin, [place] sent at 07/08/2018  8:48 AM EDT ----- Yolanda Brown,  See Laura's message below re: CPAP.  Let me know if there is anything we need to do. Thanks! ----- Message ----- From: Leone Brand, NP Sent: 07/08/2018   8:18 AM EDT To: Elliot Cousin, RMA  So increase toprol XL to 75 mg daily - so 25 mg 3 daily at same time.  For now she can take 1.5 of her tabs she has until she receives new script.   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    On instructions place [facility] [address] [place] [phone]  And pt with sleep apnea and her insurance would not pay for CPAP, now new insurance maybe have [facility] or [facility] check into reordering.  I think her sleep study was in Oct 2019.      Follow up with Dr. Anne Fu in 2 months though if abnormal test we would see earlier.  Should be Dr. Anne Fu she was new last year.  and he has never seen    Exercise 10 min once a day for now by walking    Thanks.

## 2018-07-08 NOTE — Patient Instructions (Addendum)
Medication Instructions:  Your physician has recommended you make the following change in your medication:  1.  INCREASE the Metoprolol to 25 mg taking 3 tablets daily (at the same time).  You may use your 50 mg tablets and take 1 1/2 tablets daily to use them up.  **PLEASE MONITOR YOUR BLOOD PRESSURE AND CALL THE READINGS INTO [place] NEXT WEEK SO WE CAN MAKE SURE THE INCREASED METOPROLOL IS WORKING.. [phone]** We have refilled the Amlodipine and Maxzide to [facility], which is on file.  If you need a refill on your cardiac medications before your next appointment, please call your pharmacy.   Lab work: None ordered  If you have labs (blood work) drawn today and your tests are completely normal, you will receive your results only by:  MyChart Message (if you have MyChart) OR  A paper copy in the mail If you have any lab test that is abnormal or we need to change your treatment, we will call you to review the results.  Testing/Procedures: Your physician has requested that you have a lexiscan myoview. For further information please visit [email or web address] Please follow instruction sheet, as given.  See Instructions:      [facility] Cardiovascular Imaging at [facility] [address], [address] [place], [place] [address] Phone: [phone]  Jul 08, 2018    Yolanda Brown DOB: 1960/04/11 MRN: [id number] [address] [place]line [place] [address]   Dear [name],  You are scheduled for a Myocardial Perfusion Imaging Study on:   at .  Please arrive 15 minutes prior to your appointment time for registration and insurance purposes.  The test will take approximately 3 to 4 hours to complete; you may bring reading material.  If someone comes with you to your appointment, they will need to remain in the main lobby due to limited space in the testing area. **If you are pregnant or breastfeeding, please notify the nuclear lab prior to your appointment**  How to prepare  for your Myocardial Perfusion Test:  Do not eat or drink 3 hours prior to your test, except you may have water.  Do not consume products containing caffeine (regular or decaffeinated) 12 hours prior to your test. (ex: coffee, chocolate, sodas, tea).  Do bring a list of your current medications with you.  If not listed below, you may take your medications as normal.  Do wear comfortable clothes (no dresses or overalls) and walking shoes, tennis shoes preferred (No heels or open toe shoes are allowed).  Do NOT wear cologne, perfume, aftershave, or lotions (deodorant is allowed).  If these instructions are not followed, your test will have to be rescheduled.  Please report to [address] for your test.  If you have questions or concerns about your appointment, you can call the Nuclear Lab at [phone].  If you cannot keep your appointment, please provide 24 hours notification to the Nuclear Lab, to avoid a possible $50 charge to your account.  Follow-Up: At [facility], you and your health needs are our priority.  As part of our continuing mission to provide you with exceptional heart care, we have created designated Provider Care Teams.  These Care Teams include your primary Cardiologist (physician) and Advanced Practice Providers (APPs -  Physician Assistants and Nurse Practitioners) who all work together to provide you with the care you need, when you need it. You will need a follow up appointment in 2 months.  You are scheduled  for a Pensions consultant with Dr, Anne Fu, 09/06/2018 at 8:40.  Depending upon the test results, this could change.  Any Other Special Instructions Will Be Listed Below (If Applicable). [facility] [address] [place] [phone]  Continue to exercise at least 10 minutes a day by walking    Cardiac Nuclear Scan A cardiac nuclear scan is a test that is done to check the flow of blood to your heart. It is done  when you are resting and when you are exercising. The test looks for problems such as:  Not enough blood reaching a portion of the heart.  The heart muscle not working as it should. You may need this test if:  You have heart disease.  You have had lab results that are not normal.  You have had heart surgery or a balloon procedure to open up blocked arteries (angioplasty).  You have chest pain.  You have shortness of breath. In this test, a special dye (tracer) is put into your bloodstream. The tracer will travel to your heart. A camera will then take pictures of your heart to see how the tracer moves through your heart. This test is usually done at a hospital and takes 2-4 hours. Tell a doctor about:  Any allergies you have.  All medicines you are taking, including vitamins, herbs, eye drops, creams, and over-the-counter medicines.  Any problems you or family members have had with anesthetic medicines.  Any blood disorders you have.  Any surgeries you have had.  Any medical conditions you have.  Whether you are pregnant or may be pregnant. What are the risks? Generally, this is a safe test. However, problems may occur, such as:  Serious chest pain and heart attack. This is only a risk if the stress portion of the test is done.  Rapid heartbeat.  A feeling of warmth in your chest. This feeling usually does not last long.  Allergic reaction to the tracer. What happens before the test?  Ask your doctor about changing or stopping your normal medicines. This is important.  Follow instructions from your doctor about what you cannot eat or drink.  Remove your jewelry on the day of the test. What happens during the test?  An IV tube will be inserted into one of your veins.  Your doctor will give you a small amount of tracer through the IV tube.  You will wait for 20-40 minutes while the tracer moves through your bloodstream.  Your heart will be monitored with an  electrocardiogram (ECG).  You will lie down on an exam table.  Pictures of your heart will be taken for about 15-20 minutes.  You may also have a stress test. For this test, one of these things may be done: ? You will be asked to exercise on a treadmill or a stationary bike. ? You will be given medicines that will make your heart work harder. This is done if you are unable to exercise.  When blood flow to your heart has peaked, a tracer will again be given through the IV tube.  After 20-40 minutes, you will get back on the exam table. More pictures will be taken of your heart.  Depending on the tracer that is used, more pictures may need to be taken 3-4 hours later.  Your IV tube will be removed when the test is over. The test may vary among doctors and hospitals. What happens after the test?  Ask your doctor: ? Whether  you can return to your normal schedule, including diet, activities, and medicines. ? Whether you should drink more fluids. This will help to remove the tracer from your body. Drink enough fluid to keep your pee (urine) pale yellow.  Ask your doctor, or the department that is doing the test: ? When will my results be ready? ? How will I get my results? Summary  A cardiac nuclear scan is a test that is done to check the flow of blood to your heart.  Tell your doctor whether you are pregnant or may be pregnant.  Before the test, ask your doctor about changing or stopping your normal medicines. This is important.  Ask your doctor whether you can return to your normal activities. You may be asked to drink more fluids. This information is not intended to replace advice given to you by your health care provider. Make sure you discuss any questions you have with your health care provider. Document Released: 08/09/2017 Document Revised: 08/09/2017 Document Reviewed: 08/09/2017 Elsevier Interactive Patient Education  2019 [employer]

## 2018-07-14 ENCOUNTER — Telehealth: Payer: Self-pay | Admitting: Cardiology

## 2018-07-14 ENCOUNTER — Telehealth: Payer: Self-pay | Admitting: *Deleted

## 2018-07-14 DIAGNOSIS — R0600 Dyspnea, unspecified: Secondary | ICD-10-CM

## 2018-07-14 DIAGNOSIS — R0609 Other forms of dyspnea: Secondary | ICD-10-CM

## 2018-07-14 DIAGNOSIS — R079 Chest pain, unspecified: Secondary | ICD-10-CM

## 2018-07-14 NOTE — Telephone Encounter (Signed)
Call placed to pt.  Pt understands that Dr. Anne Fu and Nada Boozer, NP, want to order a Cardiac CT instead of Lexiscan Myoview.  Pt is high risk to Covid due to her job, and understands that she will need to be tested for Covid 1st, be isolated until her CT is done.  We are looking for covid testing 07/22/2018 and CT possibly on 07/25/2018. Vernona Rieger will call [place] Dec to arrange. I will send to precert to have her insurance taken care of,  Pt thanked me for the call and understands I will call her back with all instructions.   Please arrive at the [facility] main entrance of [facility] at xx:xx AM (30-45 minutes prior to test start time)  [facility] [address] [place], [place] [address] [phone]  Proceed to the [facility] Radiology Department (First Floor).  Please follow these instructions carefully (unless otherwise directed):   On the Night Before the Test: . Be sure to Drink plenty of water. . Do not consume any caffeinated/decaffeinated beverages or chocolate 12 hours prior to your test. . Do not take any antihistamines 12 hours prior to your test. .   On the Day of the Test: . Drink plenty of water. Do not drink any water within one hour of the test. . Do not eat any food 4 hours prior to the test. . You may take your regular medications prior to the test.  . Take metoprolol (Lopressor) two hours prior to test. . HOLD MAXZIDE the morning of the test.       After the Test: . Drink plenty of water. . After receiving IV contrast, you may experience a mild flushed feeling. This is normal. . On occasion, you may experience a mild rash up to 24 hours after the test. This is not dangerous. If this occurs, you can take Benadryl 25 mg and increase your fluid intake. . If you experience trouble breathing, this can be serious. If it is severe call 911 IMMEDIATELY. If it is mild, please call our office. . If you take any of these medications:  Glipizide/Metformin, Avandament, Glucavance, please do not take 48 hours after completing test.

## 2018-07-14 NOTE — Telephone Encounter (Signed)
Left message to call and schedule  Cardiac ct.

## 2018-07-14 NOTE — Telephone Encounter (Signed)
-----   Message from Leone Brand, NP sent at 07/14/2018  8:59 AM EDT ----- Victorino Dike on Ms Netzer- instead of nuc study lets do cardiac CTA instead (would trust the results more than nuc)  So I talked to Rockwell [place] and Ms Moyeda due to her high risk status will need a covid test on a Friday and self isolate after test then CT on Monday.  If that works for her schedule.   So if not - the test needs to be done 3 days prior to test and self isolation once test is done until CT.  Then once CT is done can go back to work.   For the COVID test I can do if y'all aren't sure, Victorino Dike here has instructions.   But if you can check with pt to see when best for her.  Tell her due to her job she is high risk so we are doing this for all procedures now.   Thanks.    ----- Message ----- From: Jake Bathe, MD Sent: 07/13/2018   4:32 PM EDT To: Leone Brand, NP  Testing for COVID-19 seems to be part of the work flow for elective cases now at hospital. I have not seen the protocols. Perhaps Sarah the nurse navigator for CT would know.  Mark ----- Message ----- From: Leone Brand, NP Sent: 07/13/2018   1:30 PM EDT To: Jake Bathe, MD  Hi on Ms. Sherilyn Cooter - I am going to do cardiac CTA instead of nuc study with her BMI better study.  We will tell her to wear N95 masks and to reschedule if any fever, cough etc.  Should I do COVID test 2 days before?  Thanks Vernona Rieger

## 2018-07-15 NOTE — Telephone Encounter (Signed)
Called to inform pt that she does not have to be tested for covid, that she would be required to wear a mask.  Left a detailed message on voicemail.  Pt also reminded to call back and schedule her CT.

## 2018-07-15 NOTE — Telephone Encounter (Signed)
Yolanda Brown - so I talked to [name] again about cardiac CTA and [name] will not need COVID testing.  So [name] will call pt with date and time of test.  If you can let [name] know.  Thanks.

## 2018-07-21 NOTE — Telephone Encounter (Signed)
Returned pts call re: covid testing.  Per Vernona Rieger, Dr. Delton See and Huntley Dec, pt does not need to have covid testing, that she is only required to wear a mask.  Pt was not available, will try to call back.

## 2018-07-28 ENCOUNTER — Telehealth (HOSPITAL_COMMUNITY): Payer: Self-pay | Admitting: Emergency Medicine

## 2018-07-28 NOTE — Telephone Encounter (Signed)
Left message on voicemail with name and callback number Lela Murfin RN Navigator Cardiac Imaging [facility] [phone] Office [phone] Cell  mychart inactive 

## 2018-07-28 NOTE — Telephone Encounter (Signed)
Tried numerous times to reach pt re Cardiac CT and Covid 19 testing. I have left a detailed message on pts voicemail to let her know that per, Huntley Dec, it is not required for pt.

## 2018-07-29 ENCOUNTER — Ambulatory Visit (HOSPITAL_COMMUNITY)
Admission: RE | Admit: 2018-07-29 | Discharge: 2018-07-29 | Disposition: A | Discharge status: Home / Self Care | Payer: PRIVATE HEALTH INSURANCE | Source: Ambulatory Visit | Attending: Cardiology | Admitting: Cardiology

## 2018-07-29 ENCOUNTER — Other Ambulatory Visit: Payer: Self-pay

## 2018-07-29 ENCOUNTER — Ambulatory Visit (HOSPITAL_COMMUNITY): Admission: RE | Admit: 2018-07-29 | Payer: PRIVATE HEALTH INSURANCE | Source: Ambulatory Visit

## 2018-07-29 DIAGNOSIS — R0609 Other forms of dyspnea: Secondary | ICD-10-CM | POA: Diagnosis not present

## 2018-07-29 DIAGNOSIS — R0600 Dyspnea, unspecified: Secondary | ICD-10-CM

## 2018-07-29 DIAGNOSIS — Z006 Encounter for examination for normal comparison and control in clinical research program: Secondary | ICD-10-CM

## 2018-07-29 DIAGNOSIS — R079 Chest pain, unspecified: Secondary | ICD-10-CM | POA: Diagnosis not present

## 2018-07-29 MED ORDER — DILTIAZEM HCL 25 MG/5ML IV SOLN
INTRAVENOUS | Status: AC
  Administered 2018-07-29: 5 mg
  Filled 2018-07-29: qty 5

## 2018-07-29 MED ORDER — DILTIAZEM HCL 25 MG/5ML IV SOLN
5.0000 mg | Freq: Once | INTRAVENOUS | Status: AC
  Administered 2018-07-29: 5 mg via INTRAVENOUS
  Filled 2018-07-29: qty 5

## 2018-07-29 MED ORDER — METOPROLOL TARTRATE 5 MG/5ML IV SOLN
5.0000 mg | INTRAVENOUS | Status: DC | PRN
  Administered 2018-07-29 (×3): 5 mg via INTRAVENOUS

## 2018-07-29 MED ORDER — DILTIAZEM LOAD VIA INFUSION: 10.0000 mg | Freq: Once | INTRAVENOUS | Status: DC

## 2018-07-29 MED ORDER — METOPROLOL TARTRATE 5 MG/5ML IV SOLN
INTRAVENOUS | Status: AC
  Filled 2018-07-29: qty 5

## 2018-07-29 MED ORDER — NITROGLYCERIN 0.4 MG SL SUBL
SUBLINGUAL_TABLET | SUBLINGUAL | Status: AC
  Filled 2018-07-29: qty 2

## 2018-07-29 MED ORDER — DILTIAZEM LOAD VIA INFUSION: 5.0000 mg | Freq: Once | INTRAVENOUS | Status: DC

## 2018-07-29 MED ORDER — NITROGLYCERIN 0.4 MG SL SUBL
0.8000 mg | SUBLINGUAL_TABLET | Freq: Once | SUBLINGUAL | Status: AC
  Administered 2018-07-29: 0.8 mg via SUBLINGUAL

## 2018-07-29 MED ORDER — IOHEXOL 350 MG/ML SOLN
100.0000 mL | Freq: Once | INTRAVENOUS | Status: AC | PRN
  Administered 2018-07-29: 100 mL via INTRAVENOUS

## 2018-07-29 MED ORDER — METOPROLOL TARTRATE 5 MG/5ML IV SOLN
INTRAVENOUS | Status: AC
  Filled 2018-07-29: qty 10

## 2018-07-29 NOTE — Research (Signed)
Cadfem Informed Consent    Patient Name: Yolanda Brown    Subject met inclusion and exclusion criteria.  The informed consent form, study requirements and expectations were reviewed with the subject and questions and concerns were addressed prior to the signing of the consent form.  The subject verbalized understanding of the trail requirements.  The subject agreed to participate in the CADFEM trial and signed the informed consent.  The informed consent was obtained prior to performance of any protocol-specific procedures for the subject.  A copy of the signed informed consent was given to the subject and a copy was placed in the subject's medical record.   Neva Seat

## 2018-08-02 ENCOUNTER — Telehealth: Payer: Self-pay | Admitting: *Deleted

## 2018-08-02 NOTE — Telephone Encounter (Signed)
-----   Message from Yolanda Brand, NP sent at 08/01/2018  9:56 PM EDT ----- So good news, no CAD.  Ca+ score is 0 is low risk for CAD , please let her know she does have a hiatal hernia and that could be playing a role in the SOB.  But all this is reassuring.

## 2018-08-02 NOTE — Telephone Encounter (Signed)
Called pt re: CT Results, left a message for pt to call back.

## 2018-08-09 NOTE — Telephone Encounter (Signed)
2nd attempt to reach pt re: CT Results.  Left another message for pt to call back.

## 2018-08-17 ENCOUNTER — Encounter (HOSPITAL_COMMUNITY): Payer: Self-pay

## 2018-08-17 ENCOUNTER — Other Ambulatory Visit: Payer: Self-pay

## 2018-08-17 ENCOUNTER — Emergency Department (HOSPITAL_COMMUNITY)
Admission: EM | Admit: 2018-08-17 | Discharge: 2018-08-17 | Disposition: A | Discharge status: Home / Self Care | Payer: PRIVATE HEALTH INSURANCE | Attending: Emergency Medicine | Admitting: Emergency Medicine

## 2018-08-17 DIAGNOSIS — Z79899 Other long term (current) drug therapy: Secondary | ICD-10-CM | POA: Insufficient documentation

## 2018-08-17 DIAGNOSIS — N3000 Acute cystitis without hematuria: Secondary | ICD-10-CM | POA: Insufficient documentation

## 2018-08-17 DIAGNOSIS — Z9049 Acquired absence of other specified parts of digestive tract: Secondary | ICD-10-CM | POA: Insufficient documentation

## 2018-08-17 DIAGNOSIS — I10 Essential (primary) hypertension: Secondary | ICD-10-CM | POA: Insufficient documentation

## 2018-08-17 DIAGNOSIS — K5792 Diverticulitis of intestine, part unspecified, without perforation or abscess without bleeding: Secondary | ICD-10-CM | POA: Insufficient documentation

## 2018-08-17 DIAGNOSIS — R1032 Left lower quadrant pain: Secondary | ICD-10-CM | POA: Diagnosis present

## 2018-08-17 LAB — COMPREHENSIVE METABOLIC PANEL
ALT: 44 U/L (ref 0–44)
AST: 49 U/L — ABNORMAL HIGH (ref 15–41)
Albumin: 3.9 g/dL (ref 3.5–5.0)
Alkaline Phosphatase: 67 U/L (ref 38–126)
Anion gap: 11 (ref 5–15)
BUN: 9 mg/dL (ref 6–20)
CO2: 25 mmol/L (ref 22–32)
Calcium: 9 mg/dL (ref 8.9–10.3)
Chloride: 100 mmol/L (ref 98–111)
Creatinine, Ser: 0.87 mg/dL (ref 0.44–1.00)
GFR calc Af Amer: >60 mL/min (ref >60)
GFR calc non Af Amer: >60 mL/min (ref >60)
Glucose, Bld: 135 mg/dL — ABNORMAL HIGH (ref 70–99)
Potassium: 3.8 mmol/L (ref 3.5–5.1)
Sodium: 136 mmol/L (ref 135–145)
Total Bilirubin: 0.4 mg/dL (ref 0.3–1.2)
Total Protein: 8.5 g/dL — ABNORMAL HIGH (ref 6.5–8.1)

## 2018-08-17 LAB — CBC WITH DIFFERENTIAL/PLATELET
Abs Immature Granulocytes: 0.04 10*3/uL (ref 0.00–0.07)
Basophils Absolute: 0.1 10*3/uL (ref 0.0–0.1)
Basophils Relative: 1 %
Eosinophils Absolute: 0.4 10*3/uL (ref 0.0–0.5)
Eosinophils Relative: 3 %
HCT: 43.4 % (ref 36.0–46.0)
Hemoglobin: 13.6 g/dL (ref 12.0–15.0)
Immature Granulocytes: 0 %
Lymphocytes Relative: 20 %
Lymphs Abs: 2.6 10*3/uL (ref 0.7–4.0)
MCH: 25.7 pg — ABNORMAL LOW (ref 26.0–34.0)
MCHC: 31.3 g/dL (ref 30.0–36.0)
MCV: 81.9 fL (ref 80.0–100.0)
Monocytes Absolute: 1.1 10*3/uL — ABNORMAL HIGH (ref 0.1–1.0)
Monocytes Relative: 8 %
Neutro Abs: 8.7 10*3/uL — ABNORMAL HIGH (ref 1.7–7.7)
Neutrophils Relative %: 68 %
Platelets: 293 10*3/uL (ref 150–400)
RBC: 5.3 MIL/uL — ABNORMAL HIGH (ref 3.87–5.11)
RDW: 16.8 % — ABNORMAL HIGH (ref 11.5–15.5)
WBC: 13 10*3/uL — ABNORMAL HIGH (ref 4.0–10.5)
nRBC: 0 % (ref 0.0–0.2)

## 2018-08-17 LAB — URINALYSIS, ROUTINE W REFLEX MICROSCOPIC
Bilirubin Urine: NEGATIVE
Glucose, UA: NEGATIVE mg/dL
Ketones, ur: NEGATIVE mg/dL
Nitrite: NEGATIVE
Protein, ur: 30 mg/dL — AB
Specific Gravity, Urine: 1.016 (ref 1.005–1.030)
pH: 5 (ref 5.0–8.0)

## 2018-08-17 LAB — LIPASE, BLOOD: Lipase: 30 U/L (ref 11–51)

## 2018-08-17 MED ORDER — METRONIDAZOLE 500 MG PO TABS
500.0000 mg | ORAL_TABLET | Freq: Once | ORAL | Status: AC
  Administered 2018-08-17: 500 mg via ORAL
  Filled 2018-08-17: qty 1

## 2018-08-17 MED ORDER — ONDANSETRON HCL 4 MG/2ML IJ SOLN: 4.0000 mg | Freq: Once | INTRAMUSCULAR | Status: DC

## 2018-08-17 MED ORDER — ONDANSETRON HCL 4 MG/2ML IJ SOLN
4.0000 mg | Freq: Once | INTRAMUSCULAR | Status: AC
  Administered 2018-08-17: 4 mg via INTRAVENOUS
  Filled 2018-08-17: qty 2

## 2018-08-17 MED ORDER — HYDROCODONE-ACETAMINOPHEN 5-325 MG PO TABS: 1.0000 | ORAL_TABLET | ORAL | 0 refills | Status: DC | PRN

## 2018-08-17 MED ORDER — SODIUM CHLORIDE 0.9 % IV BOLUS
1000.0000 mL | Freq: Once | INTRAVENOUS | Status: AC
  Administered 2018-08-17: 1000 mL via INTRAVENOUS

## 2018-08-17 MED ORDER — METRONIDAZOLE 500 MG PO TABS: 500.0000 mg | ORAL_TABLET | Freq: Three times a day (TID) | ORAL | 0 refills | Status: DC

## 2018-08-17 MED ORDER — SODIUM CHLORIDE 0.9 % IV SOLN: INTRAVENOUS | Status: DC

## 2018-08-17 MED ORDER — HYDROMORPHONE HCL 1 MG/ML IJ SOLN
0.5000 mg | INTRAMUSCULAR | Status: DC | PRN
  Filled 2018-08-17: qty 1

## 2018-08-17 MED ORDER — CIPROFLOXACIN HCL 500 MG PO TABS: 500.0000 mg | ORAL_TABLET | Freq: Two times a day (BID) | ORAL | 0 refills | Status: DC

## 2018-08-17 MED ORDER — CIPROFLOXACIN IN D5W 400 MG/200ML IV SOLN
400.0000 mg | Freq: Two times a day (BID) | INTRAVENOUS | Status: DC
  Administered 2018-08-17: 400 mg via INTRAVENOUS
  Filled 2018-08-17: qty 200

## 2018-08-17 NOTE — ED Provider Notes (Signed)
[facility]-EMERGENCY DEPT Provider Note   CSN: [id number] Arrival date & time: 08/17/18  1415    History   Chief Complaint Chief Complaint  Patient presents with  . Abdominal Pain  . Nausea    HPI Yolanda Brown is a 58 y.o. female.     HPI Pt has history of diverticulitis.  She had a bout last month.  Yest she started having diarrhea then 3 am she started with severe pain in the LLQ.  It feels like her prior diverticulitis.  Since this am she feels the urge to have a BM but has not been able to.  No fevers.  Nausea without vomiting.  No trouble urinating.  Past Medical History:  Diagnosis Date  . Allergy   . Anxiety 2013   has seen Dr. Evelene CroonKaur prior  . Depression 2013   Dr. Evelene CroonKaur prior  . Diverticulitis 06/2013   3 total episodes  . DVT (deep vein thrombosis) in pregnancy   . GERD (gastroesophageal reflux disease)   . Hypertension   . Paroxysmal SVT (supraventricular tachycardia) (HCC)    Echo 8/19:  EF 55-60, Gr 1 DD, normal RVSF  . Vitamin D deficiency     Patient Active Problem List   Diagnosis Date Noted  . Obesity 08/06/2015  . Impaired fasting blood sugar 08/06/2015  . Routine general medical examination at a health care facility 08/06/2015  . Vaccine counseling 08/06/2015  . Screening for breast cancer 08/06/2015  . Chronic fatigue 08/06/2015  . Vitamin D deficiency 08/06/2015  . Non-restorative sleep 08/06/2015  . Snoring 08/06/2015  . Daytime somnolence 08/06/2015  . Anxiety and depression 01/25/2015  . History of DVT (deep vein thrombosis) 01/25/2015  . Essential hypertension 01/25/2015    Past Surgical History:  Procedure Laterality Date  . ABDOMINAL HYSTERECTOMY     fibroids, still has ovaries  . BREAST BIOPSY Left 2012   stereo/neg  . CHOLECYSTECTOMY    . [place]OS[place]  2011   [place]; polyp and diverticulosis  . WISDOM TOOTH EXTRACTION       OB History   No obstetric history on file.      Home Medications     Prior to Admission medications   Medication Sig Start Date End Date Taking? Authorizing Provider  ALPRAZolam Prudy Feeler(XANAX) 0.5 MG tablet take 1 TABLET BY MOUTH TWICE DAILY AS NEEDED for anxiety 11/13/16   Tysinger, Kermit Baloavid S, PA-C  amLODipine (NORVASC) 5 MG tablet Take 1 tablet (5 mg total) by mouth daily. 07/08/18   Leone BrandIngold, Laura R, NP  ciprofloxacin (CIPRO) 500 MG tablet Take 1 tablet (500 mg total) by mouth 2 (two) times daily. 08/17/18   Linwood DibblesKnapp, Morris Markham, MD  HYDROcodone-acetaminophen (NORCO/VICODIN) 5-325 MG tablet Take 1 tablet by mouth every 4 (four) hours as needed. 08/17/18   Linwood DibblesKnapp, Damontay Alred, MD  metoprolol succinate (TOPROL XL) 25 MG 24 hr tablet Take 3 tablets (75 mg total) by mouth daily. 07/08/18   Leone BrandIngold, Laura R, NP  metroNIDAZOLE (FLAGYL) 500 MG tablet Take 1 tablet (500 mg total) by mouth 3 (three) times daily. 08/17/18   Linwood DibblesKnapp, Lara Palinkas, MD  Multiple Vitamin (MULITIVITAMIN WITH MINERALS) TABS Take 1 tablet by mouth every morning.     [provider]  Omeprazole-Sodium Bicarbonate (ZEGERID OTC PO) Take 1 tablet by mouth daily.    [provider]  triamterene-hydrochlorothiazide (MAXZIDE-25) 37.5-25 MG tablet Take 1 tablet by mouth daily. 07/08/18   Leone BrandIngold, Laura R, NP    Family History Family  History  Problem Relation Age of Onset  . Diabetes Mother   . Heart disease Mother   . Cancer Father        prostate, mets  . Diabetes Father   . Hypertension Sister   . Diabetes Sister   . Stroke Sister   . Hypertension Brother   . Obesity Brother   . Hypertension Brother   . Other Brother        died of sepsis s/p perforated bowel  . Obesity Brother   . Heart disease Sister   . Hypertension Sister   . Diabetes Sister   . Hypertension Other   . Diabetes Other   . Cancer Paternal Uncle        13 uncles all died with cancer, various types    Social History Social History   Tobacco Use  . Smoking status: Never Smoker  . Smokeless tobacco: Never Used  Substance Use Topics  . Alcohol  use: Yes    Alcohol/week: 2.0 standard drinks    Types: 2 Glasses of wine per week    Comment: rare  . Drug use: No     Allergies   Aspirin   Review of Systems Review of Systems  All other systems reviewed and are negative.    Physical Exam Updated Vital Signs BP (!) 138/98   Pulse 97   Temp 98 F (36.7 C) (Oral)   Resp 16   SpO2 94%   Physical Exam Vitals signs and nursing note reviewed.  Constitutional:      General: She is not in acute distress.    Appearance: She is well-developed.  HENT:     Head: Normocephalic and atraumatic.     Right Ear: External ear normal.     Left Ear: External ear normal.  Eyes:     General: No scleral icterus.       Right eye: No discharge.        Left eye: No discharge.     Conjunctiva/sclera: Conjunctivae normal.  Neck:     Musculoskeletal: Neck supple.     Trachea: No tracheal deviation.  Cardiovascular:     Rate and Rhythm: Normal rate and regular rhythm.  Pulmonary:     Effort: Pulmonary effort is normal. No respiratory distress.     Breath sounds: Normal breath sounds. No stridor. No wheezing or rales.  Abdominal:     General: Bowel sounds are normal. There is no distension.     Palpations: Abdomen is soft.     Tenderness: There is abdominal tenderness in the left lower quadrant. There is guarding. There is no rebound.  Musculoskeletal:        General: No tenderness.  Skin:    General: Skin is warm and dry.     Findings: No rash.  Neurological:     Mental Status: She is alert.     Cranial Nerves: No cranial nerve deficit (no facial droop, extraocular movements intact, no slurred speech).     Sensory: No sensory deficit.     Motor: No abnormal muscle tone or seizure activity.     Coordination: Coordination normal.      ED Treatments / Results  Labs (all labs ordered are listed, but only abnormal results are displayed) Labs Reviewed  COMPREHENSIVE METABOLIC PANEL - Abnormal; Notable for the following  components:      Result Value   Glucose, Bld 135 (*)    Total Protein 8.5 (*)    AST 49 (*)  All other components within normal limits  CBC WITH DIFFERENTIAL/PLATELET - Abnormal; Notable for the following components:   WBC 13.0 (*)    RBC 5.30 (*)    MCH 25.7 (*)    RDW 16.8 (*)    Neutro Abs 8.7 (*)    Monocytes Absolute 1.1 (*)    All other components within normal limits  URINALYSIS, ROUTINE W REFLEX MICROSCOPIC - Abnormal; Notable for the following components:   APPearance HAZY (*)    Hgb urine dipstick SMALL (*)    Protein, ur 30 (*)    Leukocytes,Ua LARGE (*)    Bacteria, UA MANY (*)    All other components within normal limits  LIPASE, BLOOD    EKG None  Radiology No results found.  Procedures Procedures (including critical care time)  Medications Ordered in ED Medications  sodium chloride 0.9 % bolus 1,000 mL (1,000 mLs Intravenous New Bag/Given (Non-Interop) 08/17/18 1709)    And  0.9 %  sodium chloride infusion (has no administration in time range)  HYDROmorphone (DILAUDID) injection 0.5 mg (has no administration in time range)  ciprofloxacin (CIPRO) IVPB 400 mg (has no administration in time range)  metroNIDAZOLE (FLAGYL) tablet 500 mg (has no administration in time range)  ondansetron (ZOFRAN) injection 4 mg (4 mg Intravenous Given 08/17/18 1710)     Initial Impression / Assessment and Plan / ED Course  I have reviewed the triage vital signs and the nursing notes.  Pertinent labs & imaging results that were available during my care of the patient were reviewed by me and considered in my medical decision making (see chart for details).      Patient's laboratory tests are notable for an elevated white blood cell count.  Her urinalysis also was abnormal suggesting the possibility of urinary tract infection although there is squamous epithelial cell contamination.  Patient has a history of diverticulitis.  Her presentation is certainly consistent with that.   Patient states she had a CT scan last month with a prior bout another medical facility that confirmed diverticulitis.  We discussed treating her empirically for acute diverticulitis versus doing another CT scan.  Considering the fact that she is afebrile and not having any vomiting I think it is reasonable to try empiric treatment.  Patient agrees.  Discussed the importance of outpatient follow-up and return precautions.  Final Clinical Impressions(s) / ED Diagnoses   Final diagnoses:  Diverticulitis  Acute cystitis without hematuria    ED Discharge Orders         Ordered    HYDROcodone-acetaminophen (NORCO/VICODIN) 5-325 MG tablet  Every 4 hours PRN     08/17/18 1828    ciprofloxacin (CIPRO) 500 MG tablet  2 times daily     08/17/18 1828    metroNIDAZOLE (FLAGYL) 500 MG tablet  3 times daily     08/17/18 Silva [id number]           Linwood DibblesKnapp, Jovaun Levene, MD 08/17/18 [phone]

## 2018-08-17 NOTE — Discharge Instructions (Signed)
Take the antibiotics as prescribed, follow-up with your primary care doctor, return to the ER for fever, worsening symptoms

## 2018-08-17 NOTE — ED Triage Notes (Signed)
Pt reports abdominal pain and nausea since last night. Pt reports hx of diverticulitis and states that she feels like this is a flare up.

## 2018-08-18 ENCOUNTER — Encounter: Payer: Self-pay | Admitting: *Deleted

## 2018-08-25 ENCOUNTER — Telehealth: Payer: Self-pay | Admitting: *Deleted

## 2018-08-25 NOTE — Telephone Encounter (Signed)
Pt received my letter re: her ct results.  She has been made aware of her Cardiac CT results. Pt appreciated the efforts that has been made to contact her.

## 2018-08-31 ENCOUNTER — Telehealth: Payer: Self-pay

## 2018-08-31 NOTE — Telephone Encounter (Signed)
YOUR CARDIOLOGY TEAM HAS ARRANGED FOR AN E-VISIT FOR YOUR APPOINTMENT - PLEASE REVIEW IMPORTANT INFORMATION BELOW SEVERAL DAYS PRIOR TO YOUR APPOINTMENT  Due to the recent COVID-19 pandemic, we are transitioning in-person office visits to tele-medicine visits in an effort to decrease unnecessary exposure to our patients, their families, and staff. These visits are billed to your insurance just like a normal visit is. We also encourage you to sign up for MyChart if you have not already done so. You will need a smartphone if possible. For patients that do not have this, we can still complete the visit using a regular telephone but do prefer a smartphone to enable video when possible. You may have a family member that lives with you that can help. If possible, we also ask that you have a blood pressure cuff and scale at home to measure your blood pressure, heart rate and weight prior to your scheduled appointment. Patients with clinical needs that need an in-person evaluation and testing will still be able to come to the office if absolutely necessary. If you have any questions, feel free to call our office.     YOUR PROVIDER WILL BE USING THE FOLLOWING PLATFORM TO COMPLETE YOUR VISIT: [email or web address]  . IF USING MYCHART - How to Download the MyChart App to Your SmartPhone   - If Apple, go to App Store and type in MyChart in the search bar and download the app. If Android, ask patient to go to Google Play Store and type in MyChart in the search bar and download the app. The app is free but as with any other app downloads, your phone may require you to verify saved payment information or Apple/Android password.  - You will need to then log into the app with your MyChart username and password, and select [facility] as your healthcare provider to link the account.  - When it is time for your visit, go to the MyChart app, find appointments, and click Begin Video Visit. Be sure to Select Allow for your device to  access the Microphone and Camera for your visit. You will then be connected, and your provider will be with you shortly.  **If you have any issues connecting or need assistance, please contact MyChart service desk [phone] ([phone])**  **If using a computer, in order to ensure the best quality for your visit, you will need to use either of the following Internet Browsers: Google Chrome or Microsoft Edge**  . IF USING DOXIMITY or [email or web address].ME - The staff will give you instructions on receiving your link to join the meeting the day of your visit.      2-3 DAYS BEFORE YOUR APPOINTMENT  You will receive a telephone call from one of our [facility] team members - your caller ID may say "Unknown caller." If this is a video visit, we will walk you through how to get the video launched on your phone. We will remind you check your blood pressure, heart rate and weight prior to your scheduled appointment. If you have an Apple Watch or Kardia, please upload any pertinent ECG strips the day before or morning of your appointment to MyChart. Our staff will also make sure you have reviewed the consent and agree to move forward with your scheduled tele-health visit.     THE DAY OF YOUR APPOINTMENT  Approximately 15 minutes prior to your scheduled appointment, you will receive a telephone call from one of Heart[facility] team - your caller ID may say "Unknown caller."    Our staff will confirm medications, vital signs for the day and any symptoms you may be experiencing. Please have this information available prior to the time of visit start. It may also be helpful for you to have a pad of paper and pen handy for any instructions given during your visit. They will also walk you through joining the smartphone meeting if this is a video visit.    CONSENT FOR TELE-HEALTH VISIT - PLEASE REVIEW  I hereby voluntarily request, consent and authorize [facility] and its employed or contracted physicians, physician  assistants, nurse practitioners or other licensed health care professionals (the Practitioner), to provide me with telemedicine health care services (the "Services") as deemed necessary by the treating Practitioner. I acknowledge and consent to receive the Services by the Practitioner via telemedicine. I understand that the telemedicine visit will involve communicating with the Practitioner through live audiovisual communication technology and the disclosure of certain medical information by electronic transmission. I acknowledge that I have been given the opportunity to request an in-person assessment or other available alternative prior to the telemedicine visit and am voluntarily participating in the telemedicine visit.  I understand that I have the right to withhold or withdraw my consent to the use of telemedicine in the course of my care at any time, without affecting my right to future care or treatment, and that the Practitioner or I may terminate the telemedicine visit at any time. I understand that I have the right to inspect all information obtained and/or recorded in the course of the telemedicine visit and may receive copies of available information for a reasonable fee.  I understand that some of the potential risks of receiving the Services via telemedicine include:  . Delay or interruption in medical evaluation due to technological equipment failure or disruption; . Information transmitted may not be sufficient (e.g. poor resolution of images) to allow for appropriate medical decision making by the Practitioner; and/or  . In rare instances, security protocols could fail, causing a breach of personal health information.  Furthermore, I acknowledge that it is my responsibility to provide information about my medical history, conditions and care that is complete and accurate to the best of my ability. I acknowledge that Practitioner's advice, recommendations, and/or decision may be based on  factors not within their control, such as incomplete or inaccurate data provided by me or distortions of diagnostic images or specimens that may result from electronic transmissions. I understand that the practice of medicine is not an exact science and that Practitioner makes no warranties or guarantees regarding treatment outcomes. I acknowledge that I will receive a copy of this consent concurrently upon execution via email to the email address I last provided but may also request a printed copy by calling the office of [facility].    I understand that my insurance will be billed for this visit.   I have read or had this consent read to me. . I understand the contents of this consent, which adequately explains the benefits and risks of the Services being provided via telemedicine.  . I have been provided ample opportunity to ask questions regarding this consent and the Services and have had my questions answered to my satisfaction. . I give my informed consent for the services to be provided through the use of telemedicine in my medical care  By participating in this telemedicine visit I agree to the above.  

## 2018-09-06 ENCOUNTER — Other Ambulatory Visit: Payer: Self-pay

## 2018-09-06 ENCOUNTER — Encounter: Payer: Self-pay | Admitting: Cardiology

## 2018-09-06 ENCOUNTER — Telehealth (INDEPENDENT_AMBULATORY_CARE_PROVIDER_SITE_OTHER): Payer: Self-pay | Admitting: Cardiology

## 2018-09-06 VITALS — BP 146/92 | HR 86 | Ht 66.0 in | Wt 262.0 lb

## 2018-09-06 DIAGNOSIS — R0609 Other forms of dyspnea: Secondary | ICD-10-CM

## 2018-09-06 DIAGNOSIS — R0600 Dyspnea, unspecified: Secondary | ICD-10-CM

## 2018-09-06 DIAGNOSIS — I471 Supraventricular tachycardia: Secondary | ICD-10-CM

## 2018-09-06 DIAGNOSIS — I10 Essential (primary) hypertension: Secondary | ICD-10-CM

## 2018-09-06 NOTE — Patient Instructions (Signed)
Medication Instructions:  The current medical regimen is effective;  continue present plan and medications.  Please continue to work on weight loss through decreasing calories and increasing physical activity such as walking.  Follow-Up: Follow up as needed with Dr Anne FuSkains.  Thank you for choosing [facility]!!    General information related to counting and decreasing calorie intake.  Calorie Counting for Weight Loss Calories are units of energy. Your body needs a certain amount of calories from food to keep you going throughout the day. When you eat more calories than your body needs, your body stores the extra calories as fat. When you eat fewer calories than your body needs, your body burns fat to get the energy it needs. Calorie counting means keeping track of how many calories you eat and drink each day. Calorie counting can be helpful if you need to lose weight. If you make sure to eat fewer calories than your body needs, you should lose weight. Ask your health care provider what a healthy weight is for you. For calorie counting to work, you will need to eat the right number of calories in a day in order to lose a healthy amount of weight per week. A dietitian can help you determine how many calories you need in a day and will give you suggestions on how to reach your calorie goal.  A healthy amount of weight to lose per week is usually 1-2 lb (0.5-0.9 kg). This usually means that your daily calorie intake should be reduced by 500-750 calories.  Eating 1,200 - 1,500 calories per day can help most women lose weight.  Eating 1,500 - 1,800 calories per day can help most men lose weight. What is my plan? My goal is to have __________ calories per day. If I have this many calories per day, I should lose around __________ pounds per week. What do I need to know about calorie counting? In order to meet your daily calorie goal, you will need to:  Find out how many calories are in  each food you would like to eat. Try to do this before you eat.  Decide how much of the food you plan to eat.  Write down what you ate and how many calories it had. Doing this is called keeping a food log. To successfully lose weight, it is important to balance calorie counting with a healthy lifestyle that includes regular activity. Aim for 150 minutes of moderate exercise (such as walking) or 75 minutes of vigorous exercise (such as running) each week. Where do I find calorie information?  The number of calories in a food can be found on a Nutrition Facts label. If a food does not have a Nutrition Facts label, try to look up the calories online or ask your dietitian for help. Remember that calories are listed per serving. If you choose to have more than one serving of a food, you will have to multiply the calories per serving by the amount of servings you plan to eat. For example, the label on a package of bread might say that a serving size is 1 slice and that there are 90 calories in a serving. If you eat 1 slice, you will have eaten 90 calories. If you eat 2 slices, you will have eaten 180 calories. How do I keep a food log? Immediately after each meal, record the following information in your food log:  What you ate. Don't forget to include toppings, sauces, and other extras  on the food.  How much you ate. This can be measured in cups, ounces, or number of items.  How many calories each food and drink had.  The total number of calories in the meal. Keep your food log near you, such as in a small notebook in your pocket, or use a mobile app or website. Some programs will calculate calories for you and show you how many calories you have left for the day to meet your goal. What are some calorie counting tips?   Use your calories on foods and drinks that will fill you up and not leave you hungry: ? Some examples of foods that fill you up are nuts and nut butters, vegetables, lean  proteins, and high-fiber foods like whole grains. High-fiber foods are foods with more than 5 g fiber per serving. ? Drinks such as sodas, specialty coffee drinks, alcohol, and juices have a lot of calories, yet do not fill you up.  Eat nutritious foods and avoid empty calories. Empty calories are calories you get from foods or beverages that do not have many vitamins or protein, such as candy, sweets, and soda. It is better to have a nutritious high-calorie food (such as an avocado) than a food with few nutrients (such as a bag of chips).  Know how many calories are in the foods you eat most often. This will help you calculate calorie counts faster.  Pay attention to calories in drinks. Low-calorie drinks include water and unsweetened drinks.  Pay attention to nutrition labels for "low fat" or "fat free" foods. These foods sometimes have the same amount of calories or more calories than the full fat versions. They also often have added sugar, starch, or salt, to make up for flavor that was removed with the fat.  Find a way of tracking calories that works for you. Get creative. Try different apps or programs if writing down calories does not work for you. What are some portion control tips?  Know how many calories are in a serving. This will help you know how many servings of a certain food you can have.  Use a measuring cup to measure serving sizes. You could also try weighing out portions on a kitchen scale. With time, you will be able to estimate serving sizes for some foods.  Take some time to put servings of different foods on your favorite plates, bowls, and cups so you know what a serving looks like.  Try not to eat straight from a bag or box. Doing this can lead to overeating. Put the amount you would like to eat in a cup or on a plate to make sure you are eating the right portion.  Use smaller plates, glasses, and bowls to prevent overeating.  Try not to multitask (for example, watch  TV or use your computer) while eating. If it is time to eat, sit down at a table and enjoy your food. This will help you to know when you are full. It will also help you to be aware of what you are eating and how much you are eating. What are tips for following this plan? Reading food labels  Check the calorie count compared to the serving size. The serving size may be smaller than what you are used to eating.  Check the source of the calories. Make sure the food you are eating is high in vitamins and protein and low in saturated and trans fats. Shopping  Read nutrition labels while you  shop. This will help you make healthy decisions before you decide to purchase your food.  Make a grocery list and stick to it. Cooking  Try to cook your favorite foods in a healthier way. For example, try baking instead of frying.  Use low-fat dairy products. Meal planning  Use more fruits and vegetables. Half of your plate should be fruits and vegetables.  Include lean proteins like poultry and fish. How do I count calories when eating out?  Ask for smaller portion sizes.  Consider sharing an entree and sides instead of getting your own entree.  If you get your own entree, eat only half. Ask for a box at the beginning of your meal and put the rest of your entree in it so you are not tempted to eat it.  If calories are listed on the menu, choose the lower calorie options.  Choose dishes that include vegetables, fruits, whole grains, low-fat dairy products, and lean protein.  Choose items that are boiled, broiled, grilled, or steamed. Stay away from items that are buttered, battered, fried, or served with cream sauce. Items labeled "crispy" are usually fried, unless stated otherwise.  Choose water, low-fat milk, unsweetened iced tea, or other drinks without added sugar. If you want an alcoholic beverage, choose a lower calorie option such as a glass of wine or light beer.  Ask for dressings,  sauces, and syrups on the side. These are usually high in calories, so you should limit the amount you eat.  If you want a salad, choose a garden salad and ask for grilled meats. Avoid extra toppings like bacon, cheese, or fried items. Ask for the dressing on the side, or ask for olive oil and vinegar or lemon to use as dressing.  Estimate how many servings of a food you are given. For example, a serving of cooked rice is  cup or about the size of half a baseball. Knowing serving sizes will help you be aware of how much food you are eating at restaurants. The list below tells you how big or small some common portion sizes are based on everyday objects: ? 1 oz--4 stacked dice. ? 3 oz--1 deck of cards. ? 1 tsp--1 die. ? 1 Tbsp-- a ping-pong ball. ? 2 Tbsp--1 ping-pong ball. ?  cup-- baseball. ? 1 cup--1 baseball. Summary  Calorie counting means keeping track of how many calories you eat and drink each day. If you eat fewer calories than your body needs, you should lose weight.  A healthy amount of weight to lose per week is usually 1-2 lb (0.5-0.9 kg). This usually means reducing your daily calorie intake by 500-750 calories.  The number of calories in a food can be found on a Nutrition Facts label. If a food does not have a Nutrition Facts label, try to look up the calories online or ask your dietitian for help.  Use your calories on foods and drinks that will fill you up, and not on foods and drinks that will leave you hungry.  Use smaller plates, glasses, and bowls to prevent overeating. This information is not intended to replace advice given to you by your health care provider. Make sure you discuss any questions you have with your health care provider. Document Released: 02/23/2005 Document Revised: 11/12/2017 Document Reviewed: 01/24/2016 Elsevier Patient Education  2020 [employer].

## 2018-09-06 NOTE — Progress Notes (Signed)
Virtual Visit via Video Note   This visit type was conducted due to national recommendations for restrictions regarding the COVID-19 Pandemic (e.g. social distancing) in an effort to limit this patient's exposure and mitigate transmission in our community.  Due to her co-morbid illnesses, this patient is at least at moderate risk for complications without adequate follow up.  This format is felt to be most appropriate for this patient at this time.  All issues noted in this document were discussed and addressed.  A limited physical exam was performed with this format.  Please refer to the patient's chart for her consent [facility].   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 Hiatal hernia noted.   Mother 1845 had CHF.  SVT 3 episodes. ECHO 2019 55%EF. Toprol helping.   Overall she is doing very well.  She does have some shortness of breath with exertional activity but this is likely related to obesity.   The patient does not have symptoms concerning for COVID-19 infection (fever, chills, cough, or new shortness of breath).    Past Medical History:  Diagnosis Date  . Allergy   . Anxiety 2013   has seen Dr. Evelene CroonKaur prior  . Depression 2013   Dr. Evelene CroonKaur prior  . Diverticulitis 06/2013   3 total episodes  . DVT (deep vein thrombosis) in pregnancy   . GERD (gastroesophageal reflux disease)   . Hypertension   . Paroxysmal SVT (supraventricular tachycardia) (HCC)    Echo 8/19:  EF 55-60, Gr 1 DD, normal RVSF  . Vitamin D deficiency    Past Surgical History:  Procedure Laterality Date  .  ABDOMINAL HYSTERECTOMY     fibroids, still has ovaries  . BREAST BIOPSY Left 2012   stereo/neg  . CHOLECYSTECTOMY    . [place]  [place] polyp and diverticulosis  . WISDOM TOOTH EXTRACTION       Current Meds  Medication Sig  . ALPRAZolam (XANAX) 0.5 MG tablet take 1 TABLET BY MOUTH TWICE DAILY AS NEEDED for anxiety  . amLODipine (NORVASC) 5 MG tablet Take 1 tablet (5 mg total) by mouth daily.  . metoprolol tartrate (LOPRESSOR) 50 MG tablet Take 50 mg by mouth daily.  . Multiple Vitamin (MULITIVITAMIN WITH MINERALS) TABS Take 1 tablet by mouth every morning.   [name]. Omeprazole-Sodium Bicarbonate (ZEGERID OTC PO) Take 1 tablet by mouth daily.  Marland Kitchen. triamterene-hydrochlorothiazide (MAXZIDE-25) 37.5-25 MG tablet Take 1 tablet by mouth daily.     Allergies:   Aspirin   Social History   Tobacco Use  . Smoking status: Never Smoker  . Smokeless tobacco: Never Used  Substance Use Topics  . Alcohol use: Yes    Alcohol/week: 2.0 standard drinks    Types: 2 Glasses of wine per week    Comment: rare  . Drug use: No     Family Hx: The patient's family history includes Cancer in her father and paternal uncle; Diabetes in her father, mother, sister, sister, and another family member; Heart disease in her mother and sister; Hypertension in  her brother, brother, sister, sister, and another family member; Obesity in her brother and brother; Other in her brother; Stroke in her sister.  ROS:   Please see the history of present illness.    Denies any fevers chills nausea vomiting syncope bleeding All other systems reviewed and are negative.   Prior CV studies:   The following studies were reviewed today:  CT coronary calcium score 0 no CAD  Labs/Other Tests and Data Reviewed:    EKG:  Prior EKG showed sinus rhythm  Recent Labs: 08/17/2018: ALT 44; BUN 9; Creatinine, Ser 0.87; Hemoglobin 13.6; Platelets 293; Potassium 3.8; Sodium 136   Recent Lipid Panel Lab Results   Component Value Date/Time   CHOL 175 08/06/2015 01:19 PM   TRIG 193 (H) 08/06/2015 01:19 PM   HDL 37 (L) 08/06/2015 01:19 PM   CHOLHDL 4.7 08/06/2015 01:19 PM   LDLCALC 99 08/06/2015 01:19 PM    Wt Readings from Last 3 Encounters:  09/06/18 262 lb (118.8 kg)  07/08/18 265 lb (120.2 kg)  01/07/18 251 lb (113.9 kg)     Objective:    Vital Signs:  BP (!) 146/92   Pulse 86   Ht 5\' 6"  (1.676 m)   Wt 262 lb (118.8 kg)   BMI 42.29 kg/m    VITAL SIGNS:  reviewed GEN:  no acute distress EYES:  sclerae anicteric, EOMI - Extraocular Movements Intact RESPIRATORY:  normal respiratory effort, symmetric expansion SKIN:  no rash, lesions or ulcers. MUSCULOSKELETAL:  no obvious deformities. NEURO:  alert and oriented x 3, no obvious focal deficit PSYCH:  normal affect  ASSESSMENT & PLAN:    Dyspnea - Coronary CT very reassuring, calcium score 0, no CAD.  Echo also reassuring with normal EF.  Continue with exercise, weight loss.  Conditioning efforts.  Morbid obesity -Continue to encourage weight loss.  This will help with shortness of breath.  Essential hypertension -Multidrug regimen.  No changes made today.  Continue to follow with Everardo Beals, NP.  SVT -Continue with Toprol.  Please do not hesitate to let us know if you need further assistance.  COVID-19 Education: The signs and symptoms of COVID-19 were discussed with the patient and how to seek care for testing (follow up with PCP or arrange E-visit).  The importance of social distancing was discussed today.  Time:   Today, I have spent 11 minutes with the patient with telehealth technology discussing the above problems.     Medication Adjustments/Labs and Tests Ordered: Current medicines are reviewed at length with the patient today.  Concerns regarding medicines are outlined above.   Tests Ordered: No orders of the defined types were placed in this encounter.   Medication Changes: No orders of the defined  types were placed in this encounter.   Follow Up:  Virtual Visit or In Person prn  Signed, Candee Furbish, MD  09/06/2018 9:32 AM    [facility]

## 2018-10-27 ENCOUNTER — Other Ambulatory Visit: Payer: Self-pay | Admitting: Cardiology

## 2019-06-09 DIAGNOSIS — M545 Low back pain, unspecified: Secondary | ICD-10-CM | POA: Insufficient documentation

## 2019-06-09 DIAGNOSIS — N760 Acute vaginitis: Secondary | ICD-10-CM | POA: Insufficient documentation

## 2019-06-14 DIAGNOSIS — E119 Type 2 diabetes mellitus without complications: Secondary | ICD-10-CM | POA: Insufficient documentation

## 2019-06-21 DIAGNOSIS — E119 Type 2 diabetes mellitus without complications: Secondary | ICD-10-CM | POA: Insufficient documentation

## 2019-06-21 DIAGNOSIS — E7800 Pure hypercholesterolemia, unspecified: Secondary | ICD-10-CM | POA: Insufficient documentation

## 2019-06-23 DIAGNOSIS — Z01818 Encounter for other preprocedural examination: Secondary | ICD-10-CM | POA: Insufficient documentation

## 2019-06-23 DIAGNOSIS — Z8249 Family history of ischemic heart disease and other diseases of the circulatory system: Secondary | ICD-10-CM | POA: Insufficient documentation

## 2019-07-10 DIAGNOSIS — G4733 Obstructive sleep apnea (adult) (pediatric): Secondary | ICD-10-CM | POA: Insufficient documentation

## 2019-07-10 DIAGNOSIS — L811 Chloasma: Secondary | ICD-10-CM | POA: Insufficient documentation

## 2019-07-18 ENCOUNTER — Other Ambulatory Visit: Payer: Self-pay | Admitting: Cardiology

## 2019-08-01 DIAGNOSIS — Z20822 Contact with and (suspected) exposure to covid-19: Secondary | ICD-10-CM | POA: Insufficient documentation

## 2019-08-09 ENCOUNTER — Other Ambulatory Visit: Payer: Self-pay | Admitting: Specialist

## 2019-08-09 DIAGNOSIS — K449 Diaphragmatic hernia without obstruction or gangrene: Secondary | ICD-10-CM

## 2019-08-09 DIAGNOSIS — K219 Gastro-esophageal reflux disease without esophagitis: Secondary | ICD-10-CM

## 2019-08-25 ENCOUNTER — Other Ambulatory Visit: Payer: Self-pay | Admitting: Cardiology

## 2019-08-28 ENCOUNTER — Other Ambulatory Visit: Payer: Self-pay

## 2019-08-29 DIAGNOSIS — K5732 Diverticulitis of large intestine without perforation or abscess without bleeding: Secondary | ICD-10-CM | POA: Insufficient documentation

## 2019-09-02 ENCOUNTER — Other Ambulatory Visit: Payer: Self-pay | Admitting: Cardiology

## 2019-09-15 ENCOUNTER — Encounter (HOSPITAL_BASED_OUTPATIENT_CLINIC_OR_DEPARTMENT_OTHER): Payer: Self-pay | Admitting: *Deleted

## 2019-09-15 ENCOUNTER — Other Ambulatory Visit: Payer: Self-pay

## 2019-09-15 ENCOUNTER — Emergency Department (HOSPITAL_BASED_OUTPATIENT_CLINIC_OR_DEPARTMENT_OTHER): Payer: Self-pay

## 2019-09-15 ENCOUNTER — Inpatient Hospital Stay (HOSPITAL_BASED_OUTPATIENT_CLINIC_OR_DEPARTMENT_OTHER)
Admission: EM | Admit: 2019-09-15 | Discharge: 2019-09-21 | DRG: 392 | Disposition: A | Discharge status: Home / Self Care | Payer: Self-pay | Attending: Internal Medicine | Admitting: Internal Medicine

## 2019-09-15 DIAGNOSIS — I10 Essential (primary) hypertension: Secondary | ICD-10-CM | POA: Diagnosis present

## 2019-09-15 DIAGNOSIS — Z6841 Body Mass Index (BMI) 40.0 and over, adult: Secondary | ICD-10-CM

## 2019-09-15 DIAGNOSIS — Z823 Family history of stroke: Secondary | ICD-10-CM

## 2019-09-15 DIAGNOSIS — K219 Gastro-esophageal reflux disease without esophagitis: Secondary | ICD-10-CM | POA: Diagnosis present

## 2019-09-15 DIAGNOSIS — Z8719 Personal history of other diseases of the digestive system: Secondary | ICD-10-CM

## 2019-09-15 DIAGNOSIS — Z90710 Acquired absence of both cervix and uterus: Secondary | ICD-10-CM

## 2019-09-15 DIAGNOSIS — Z8249 Family history of ischemic heart disease and other diseases of the circulatory system: Secondary | ICD-10-CM

## 2019-09-15 DIAGNOSIS — K5732 Diverticulitis of large intestine without perforation or abscess without bleeding: Principal | ICD-10-CM | POA: Diagnosis present

## 2019-09-15 DIAGNOSIS — Z20822 Contact with and (suspected) exposure to covid-19: Secondary | ICD-10-CM | POA: Diagnosis present

## 2019-09-15 DIAGNOSIS — Z79899 Other long term (current) drug therapy: Secondary | ICD-10-CM

## 2019-09-15 DIAGNOSIS — E119 Type 2 diabetes mellitus without complications: Secondary | ICD-10-CM | POA: Diagnosis present

## 2019-09-15 DIAGNOSIS — Z833 Family history of diabetes mellitus: Secondary | ICD-10-CM

## 2019-09-15 DIAGNOSIS — Z7984 Long term (current) use of oral hypoglycemic drugs: Secondary | ICD-10-CM

## 2019-09-15 DIAGNOSIS — R7301 Impaired fasting glucose: Secondary | ICD-10-CM | POA: Diagnosis present

## 2019-09-15 DIAGNOSIS — R197 Diarrhea, unspecified: Secondary | ICD-10-CM

## 2019-09-15 DIAGNOSIS — K5792 Diverticulitis of intestine, part unspecified, without perforation or abscess without bleeding: Secondary | ICD-10-CM | POA: Diagnosis present

## 2019-09-15 HISTORY — DX: Barrett's esophagus without dysplasia: K22.70

## 2019-09-15 LAB — COMPREHENSIVE METABOLIC PANEL
ALT: 31 U/L (ref 0–44)
AST: 41 U/L (ref 15–41)
Albumin: 3.8 g/dL (ref 3.5–5.0)
Alkaline Phosphatase: 60 U/L (ref 38–126)
Anion gap: 12 (ref 5–15)
BUN: 10 mg/dL (ref 8–23)
CO2: 25 mmol/L (ref 22–32)
Calcium: 9.5 mg/dL (ref 8.9–10.3)
Chloride: 98 mmol/L (ref 98–111)
Creatinine, Ser: 0.83 mg/dL (ref 0.44–1.00)
GFR calc Af Amer: >60 mL/min (ref >60)
GFR calc non Af Amer: >60 mL/min (ref >60)
Glucose, Bld: 135 mg/dL — ABNORMAL HIGH (ref 70–99)
Potassium: 3.8 mmol/L (ref 3.5–5.1)
Sodium: 135 mmol/L (ref 135–145)
Total Bilirubin: 0.2 mg/dL — ABNORMAL LOW (ref 0.3–1.2)
Total Protein: 8.9 g/dL — ABNORMAL HIGH (ref 6.5–8.1)

## 2019-09-15 LAB — URINALYSIS, MICROSCOPIC (REFLEX)

## 2019-09-15 LAB — URINALYSIS, ROUTINE W REFLEX MICROSCOPIC
Bilirubin Urine: NEGATIVE
Glucose, UA: NEGATIVE mg/dL
Ketones, ur: NEGATIVE mg/dL
Nitrite: NEGATIVE
Protein, ur: 100 mg/dL — AB
Specific Gravity, Urine: 1.02 (ref 1.005–1.030)
pH: 6 (ref 5.0–8.0)

## 2019-09-15 LAB — CBC
HCT: 42.1 % (ref 36.0–46.0)
Hemoglobin: 13.5 g/dL (ref 12.0–15.0)
MCH: 25.9 pg — ABNORMAL LOW (ref 26.0–34.0)
MCHC: 32.1 g/dL (ref 30.0–36.0)
MCV: 80.7 fL (ref 80.0–100.0)
Platelets: 327 10*3/uL (ref 150–400)
RBC: 5.22 MIL/uL — ABNORMAL HIGH (ref 3.87–5.11)
RDW: 16.1 % — ABNORMAL HIGH (ref 11.5–15.5)
WBC: 10.3 10*3/uL (ref 4.0–10.5)
nRBC: 0 % (ref 0.0–0.2)

## 2019-09-15 LAB — SARS CORONAVIRUS 2 BY RT PCR (HOSPITAL ORDER, PERFORMED IN ~~LOC~~ HOSPITAL LAB): SARS Coronavirus 2: NEGATIVE

## 2019-09-15 LAB — LIPASE, BLOOD: Lipase: 28 U/L (ref 11–51)

## 2019-09-15 MED ORDER — IOHEXOL 300 MG/ML  SOLN
100.0000 mL | Freq: Once | INTRAMUSCULAR | Status: AC | PRN
  Administered 2019-09-15: 100 mL via INTRAVENOUS

## 2019-09-15 MED ORDER — ONDANSETRON HCL 4 MG/2ML IJ SOLN
4.0000 mg | Freq: Once | INTRAMUSCULAR | Status: AC
  Administered 2019-09-15: 4 mg via INTRAVENOUS
  Filled 2019-09-15: qty 2

## 2019-09-15 MED ORDER — MORPHINE SULFATE (PF) 4 MG/ML IV SOLN
4.0000 mg | Freq: Once | INTRAVENOUS | Status: AC
  Administered 2019-09-15: 4 mg via INTRAVENOUS
  Filled 2019-09-15: qty 1

## 2019-09-15 MED ORDER — SODIUM CHLORIDE 0.9% FLUSH
3.0000 mL | Freq: Once | INTRAVENOUS | Status: DC
  Filled 2019-09-15: qty 3

## 2019-09-15 MED ORDER — PIPERACILLIN-TAZOBACTAM 3.375 G IVPB 30 MIN
3.3750 g | Freq: Once | INTRAVENOUS | Status: AC
  Administered 2019-09-15: 3.375 g via INTRAVENOUS
  Filled 2019-09-15 (×2): qty 50

## 2019-09-15 MED ORDER — SODIUM CHLORIDE 0.9 % IV BOLUS
500.0000 mL | Freq: Once | INTRAVENOUS | Status: AC
  Administered 2019-09-15: 500 mL via INTRAVENOUS

## 2019-09-15 NOTE — ED Notes (Signed)
Patient transported to CT 

## 2019-09-15 NOTE — ED Triage Notes (Signed)
Abdominal pain. Diarrhea.Recent diagnosis of diverticulitis that has been treated x 3 with no improvement. 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

## 2019-09-15 NOTE — ED Provider Notes (Signed)
[facility] EMERGENCY DEPARTMENT Provider Note   CSN: [id number] Arrival date & time: 09/15/19  1648     History Chief Complaint  Patient presents with  . Abdominal Pain    Yolanda Brown is a 59 y.o. woman with history of diverticulitis, hiatal hernia, Barrett's esophagus, GERD, DM, and HTN who presents with abdominal pain and diarrhea in the setting of recently diagnosed diverticulitis.   Patient reports she was diagnosed with diverticulitis in early June while being worked up for possible bariatric surgery. Shortly after being diagnosed, she developed her characteristic LLQ pain and associated diarrhea. She reports a long history of diverticulitis which antibiotics have resolved. However, she has now completed three courses of antibiotics, including levofloxacin, metronidazole, and ciprofloxacin, and has continued LLQ and diarrhea. States the sharp pain is constant in nature, located primarily in the LLQ with radiation to the suprapubic area, and is alleviated minimally by Tylenol, passing gas, and fetal positioning. She also endorses nausea but no emesis. Has been able to eat and drink normally but has loose stools soon after any solids. Has tried Imodium with minimal relief. Denies recent fever/chills, dizziness, lightheadedness, HA, CP, SOB, blood in her stool, dysuria. States she sometimes feels like she cannot quite empty her entire bladder while peeing. Denies sick contacts or recent hospitalizations.      Past Medical History:  Diagnosis Date  . Allergy   . Anxiety 2013   has seen Dr. Evelene Croon prior  . Barrett esophagus   . Depression 2013   Dr. Evelene Croon prior  . Diverticulitis 06/2013   3 total episodes  . DVT (deep vein thrombosis) in pregnancy   . GERD (gastroesophageal reflux disease)   . Hypertension   . Paroxysmal SVT (supraventricular tachycardia) (HCC)    Echo 8/19:  EF 55-60, Gr 1 DD, normal RVSF  . Vitamin D deficiency     Patient Active Problem List    Diagnosis Date Noted  . Acute diverticulitis 09/15/2019  . Obesity 08/06/2015  . Impaired fasting blood sugar 08/06/2015  . Routine general medical examination at a health care facility 08/06/2015  . Vaccine counseling 08/06/2015  . Screening for breast cancer 08/06/2015  . Chronic fatigue 08/06/2015  . Vitamin D deficiency 08/06/2015  . Non-restorative sleep 08/06/2015  . Snoring 08/06/2015  . Daytime somnolence 08/06/2015  . Anxiety and depression 01/25/2015  . History of DVT (deep vein thrombosis) 01/25/2015  . Essential hypertension 01/25/2015    Past Surgical History:  Procedure Laterality Date  . ABDOMINAL HYSTERECTOMY     fibroids, still has ovaries  . BREAST BIOPSY Left 2012   stereo/neg  . CHOLECYSTECTOMY    . COLONOSCOPY  2011   [place], [place]; polyp and diverticulosis  . WISDOM TOOTH EXTRACTION       OB History   No obstetric history on file.     Family History  Problem Relation Age of Onset  . Diabetes Mother   . Heart disease Mother   . Cancer Father        prostate, mets  . Diabetes Father   . Hypertension Sister   . Diabetes Sister   . Stroke Sister   . Hypertension Brother   . Obesity Brother   . Hypertension Brother   . Other Brother        died of sepsis s/p perforated bowel  . Obesity Brother   . Heart disease Sister   . Hypertension Sister   . Diabetes Sister   . Hypertension Other   .  Diabetes Other   . Cancer Paternal Uncle        13 uncles all died with cancer, various types    Social History   Tobacco Use  . Smoking status: Never Smoker  . Smokeless tobacco: Never Used  Vaping Use  . Vaping Use: Never used  Substance Use Topics  . Alcohol use: Yes    Alcohol/week: 2.0 standard drinks    Types: 2 Glasses of wine per week    Comment: rare  . Drug use: No    Home Medications Prior to Admission medications   Medication Sig Start Date End Date Taking? Authorizing Provider  ALPRAZolam Prudy Feeler) 0.5 MG tablet take 1 TABLET BY  MOUTH TWICE DAILY AS NEEDED for anxiety 11/13/16  Yes Tysinger, Kermit Balo, PA-C  amLODipine (NORVASC) 5 MG tablet TAKE 1 TABLET BY MOUTH EVERY DAY 08/25/19  Yes Jake Bathe, MD  metFORMIN (GLUCOPHAGE) 1000 MG tablet Take 1,000 mg by mouth 2 (two) times daily with a meal.   Yes [provider]  metoprolol succinate (TOPROL-XL) 50 MG 24 hr tablet Take 1 tablet (50 mg total) by mouth daily. MAY TAKE EXTRA TABLET FOR SVT EPISODE AS NEEDED. Please make overdue appt with Dr. Anne Fu. 1st attempt 08/25/19  Yes Jake Bathe, MD  Multiple Vitamin (MULITIVITAMIN WITH MINERALS) TABS Take 1 tablet by mouth every morning.    Yes [provider]  Omeprazole-Sodium Bicarbonate (ZEGERID OTC PO) Take 1 tablet by mouth daily.   Yes [provider]  triamterene-hydrochlorothiazide (MAXZIDE-25) 37.5-25 MG tablet TAKE 1 TABLET BY MOUTH EVERY DAY 07/19/19  Yes Leone Brand, NP  metoprolol tartrate (LOPRESSOR) 50 MG tablet Take 50 mg by mouth daily.    [provider]    Allergies    Aspirin  Review of Systems   Review of Systems  Constitutional: Positive for fatigue. Negative for chills and fever.  Respiratory: Negative for chest tightness and shortness of breath.   Cardiovascular: Negative for chest pain and palpitations.  Gastrointestinal: Positive for abdominal pain, diarrhea and nausea. Negative for blood in stool and vomiting.  Genitourinary: Negative for dysuria, frequency and urgency.  Skin: Negative for rash.  Neurological: Negative for dizziness and headaches.    Physical Exam Updated Vital Signs BP 126/69 (BP Location: Left Arm)   Pulse 87   Temp 98.5 F (36.9 C) (Oral)   Resp 16   Ht 5\' 6"  (1.676 m)   Wt 122.9 kg   SpO2 96%   BMI 43.74 kg/m   Physical Exam Constitutional:      General: She is not in acute distress.    Appearance: She is obese. She is not diaphoretic.     Comments: Tired- and uncomfortable-appearing  HENT:     Head: Normocephalic and  atraumatic.     Mouth/Throat:     Mouth: Mucous membranes are moist.  Cardiovascular:     Rate and Rhythm: Normal rate and regular rhythm.     Heart sounds: Normal heart sounds.  Pulmonary:     Effort: Pulmonary effort is normal.     Breath sounds: Normal breath sounds.  Abdominal:     General: Bowel sounds are normal.     Palpations: Abdomen is soft. There is no mass.     Tenderness: There is abdominal tenderness in the suprapubic area and left lower quadrant. There is guarding. There is no rebound.  Skin:    General: Skin is warm and dry.  Neurological:  Mental Status: She is alert and oriented to person, place, and time.     ED Results / Procedures / Treatments   Labs (all labs ordered are listed, but only abnormal results are displayed) Labs Reviewed  COMPREHENSIVE METABOLIC PANEL - Abnormal; Notable for the following components:      Result Value   Glucose, Bld 135 (*)    Total Protein 8.9 (*)    Total Bilirubin 0.2 (*)    All other components within normal limits  CBC - Abnormal; Notable for the following components:   RBC 5.22 (*)    MCH 25.9 (*)    RDW 16.1 (*)    All other components within normal limits  URINALYSIS, ROUTINE W REFLEX MICROSCOPIC - Abnormal; Notable for the following components:   Hgb urine dipstick TRACE (*)    Protein, ur 100 (*)    Leukocytes,Ua SMALL (*)    All other components within normal limits  URINALYSIS, MICROSCOPIC (REFLEX) - Abnormal; Notable for the following components:   Bacteria, UA MANY (*)    All other components within normal limits  C DIFFICILE QUICK SCREEN W PCR REFLEX  URINE CULTURE  SARS CORONAVIRUS 2 BY RT PCR (HOSPITAL ORDER, PERFORMED IN [facility])  LIPASE, BLOOD    EKG None  Radiology CT ABDOMEN PELVIS W CONTRAST  Result Date: 09/15/2019 CLINICAL DATA:  Left lower quadrant pain for 3 weeks, worse over the past few days. Diverticulitis suspected. EXAM: CT ABDOMEN AND PELVIS WITH CONTRAST  TECHNIQUE: Multidetector CT imaging of the abdomen and pelvis was performed using the standard protocol following bolus administration of intravenous contrast. CONTRAST:  100mL OMNIPAQUE IOHEXOL 300 MG/ML  SOLN COMPARISON:  CT 07/13/2018 FINDINGS: Lower chest: Minor subsegmental atelectasis in the left lung base. Moderate to large hiatal hernia. Hepatobiliary: Enlarged liver spanning 24 cm cranial caudal. Postcholecystectomy. Common bile duct measures 17 mm at the porta hepatis with normal tapering. This appears similar to prior exam. Pancreas: No ductal dilatation or inflammation. Spleen: Normal in size without focal abnormality. Adrenals/Urinary Tract: Normal adrenal glands. Lobulated renal contours without hydronephrosis. No significant perinephric edema. There is symmetric excretion on delayed phase imaging. Urinary bladder is partially distended. No bladder wall thickening. Stomach/Bowel: Moderately large hiatal hernia. Greater than 50% of the stomach is intrathoracic. Normal positioning of the duodenum. There is no small bowel obstruction or inflammatory change. Normal appendix. Multifocal colonic diverticulosis, throughout the entire colon. Diverticular most prominent in the sigmoid colon. Minimal faint. Diverticular fat stranding at the junction of the descending and sigmoid, series 2, image 62. No perforation or abscess. Vascular/Lymphatic: Aortic atherosclerosis. No aortic aneurysm. Patent portal vein. Multiple small retroperitoneal nodes measure up to 7 mm. 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 There are no enlarged lymph nodes in the abdomen or pelvis. Reproductive: Uterus surgically absent. Ovaries are quiescent. No adnexal mass. Other: No free air, free fluid, or intra-abdominal fluid collection. Musculoskeletal: Spine degenerative change with primarily facet hypertrophy. There is degenerative disc disease at L5-S1. IMPRESSION: 1.  Acute uncomplicated diverticulitis at the junction of the descending and sigmoid colon. No perforation or abscess. 2. Moderately large hiatal hernia. 3. Hepatomegaly. Aortic Atherosclerosis (ICD10-I70.0). Electronically Signed   By: Narda RutherfordMelanie  Sanford M.D.   On: 09/15/2019 22:14    Medications Ordered in ED Medications  sodium chloride 0.9 % bolus 500 mL (0 mLs Intravenous Stopped 09/15/19 2307)  morphine 4 MG/ML injection 4 mg (4 mg Intravenous Given 09/15/19  2141)  ondansetron (ZOFRAN) injection 4 mg (4 mg Intravenous Given 09/15/19 2139)  iohexol (OMNIPAQUE) 300 MG/ML solution 100 mL (100 mLs Intravenous Contrast Given 09/15/19 2148)  piperacillin-tazobactam (ZOSYN) IVPB 3.375 g (0 g Intravenous Stopped 09/15/19 2322)    ED Course  I have reviewed the triage vital signs and the nursing notes.  Pertinent labs & imaging results that were available during my care of the patient were reviewed by me and considered in my medical decision making (see chart for details).    MDM Rules/Calculators/A&P                         Jansen Goodpasture Schlag is a 59 y.o. woman with history of diverticulitis, hiatal hernia, Barrett's esophagus, GERD, DM, and HTN who presents with abdominal pain and diarrhea in the setting of recently diagnosed diverticulitis.   Patient is non-toxic with stable vitals. LLQ pain is sharp and unrelenting. This is despite weeks of PO antibiotic treatment. CBC, CMP, lipase unremarkable. Urinalysis with bacteria and small leukocytes. Will treat with IVF, morphine, Zofran, and initiate IV abx. Will repeat CT abdomen. Patient has not been hospitalized recently, though with recent antibiotics C diff on the differential, will get sample for testing.   - Patient with some improvement in pain with morphine. Repeat CT demonstrates acute uncomplicated diverticulitis. Case discussed with hospitalists at [facility], where patient will be transferred for further treatment.  Final Clinical Impression(s) / ED  Diagnoses Final diagnoses:  Diverticulitis  Diarrhea, unspecified type    Rx / DC Orders ED Discharge Orders    None       Alphonzo Severance, MD 09/15/19 2349    Maia Plan, MD 09/23/19 2045

## 2019-09-16 DIAGNOSIS — I10 Essential (primary) hypertension: Secondary | ICD-10-CM

## 2019-09-16 DIAGNOSIS — R7301 Impaired fasting glucose: Secondary | ICD-10-CM

## 2019-09-16 DIAGNOSIS — K5792 Diverticulitis of intestine, part unspecified, without perforation or abscess without bleeding: Secondary | ICD-10-CM

## 2019-09-16 DIAGNOSIS — R197 Diarrhea, unspecified: Secondary | ICD-10-CM

## 2019-09-16 LAB — BASIC METABOLIC PANEL
Anion gap: 9 (ref 5–15)
BUN: 9 mg/dL (ref 8–23)
CO2: 25 mmol/L (ref 22–32)
Calcium: 9.2 mg/dL (ref 8.9–10.3)
Chloride: 101 mmol/L (ref 98–111)
Creatinine, Ser: 0.83 mg/dL (ref 0.44–1.00)
GFR calc Af Amer: >60 mL/min (ref >60)
GFR calc non Af Amer: >60 mL/min (ref >60)
Glucose, Bld: 129 mg/dL — ABNORMAL HIGH (ref 70–99)
Potassium: 3.9 mmol/L (ref 3.5–5.1)
Sodium: 135 mmol/L (ref 135–145)

## 2019-09-16 LAB — CBC
HCT: 39.4 % (ref 36.0–46.0)
Hemoglobin: 12.5 g/dL (ref 12.0–15.0)
MCH: 25.6 pg — ABNORMAL LOW (ref 26.0–34.0)
MCHC: 31.7 g/dL (ref 30.0–36.0)
MCV: 80.7 fL (ref 80.0–100.0)
Platelets: 309 10*3/uL (ref 150–400)
RBC: 4.88 MIL/uL (ref 3.87–5.11)
RDW: 16 % — ABNORMAL HIGH (ref 11.5–15.5)
WBC: 8 10*3/uL (ref 4.0–10.5)
nRBC: 0 % (ref 0.0–0.2)

## 2019-09-16 LAB — GLUCOSE, CAPILLARY
Glucose-Capillary: 137 mg/dL — ABNORMAL HIGH (ref 70–99)
Glucose-Capillary: 120 mg/dL — ABNORMAL HIGH (ref 70–99)
Glucose-Capillary: 116 mg/dL — ABNORMAL HIGH (ref 70–99)
Glucose-Capillary: 119 mg/dL — ABNORMAL HIGH (ref 70–99)
Glucose-Capillary: 121 mg/dL — ABNORMAL HIGH (ref 70–99)

## 2019-09-16 LAB — HIV ANTIBODY (ROUTINE TESTING W REFLEX): HIV Screen 4th Generation wRfx: NONREACTIVE

## 2019-09-16 LAB — HEMOGLOBIN A1C
Hgb A1c MFr Bld: 7.3 % — ABNORMAL HIGH (ref 4.8–5.6)
Mean Plasma Glucose: 162.81 mg/dL

## 2019-09-16 MED ORDER — HYDROCODONE-ACETAMINOPHEN 7.5-325 MG PO TABS
1.0000 | ORAL_TABLET | Freq: Four times a day (QID) | ORAL | Status: DC | PRN
  Administered 2019-09-16 – 2019-09-18 (×4): 1 via ORAL
  Filled 2019-09-16 (×5): qty 1

## 2019-09-16 MED ORDER — PIPERACILLIN-TAZOBACTAM 3.375 G IVPB
3.3750 g | Freq: Three times a day (TID) | INTRAVENOUS | Status: DC
  Administered 2019-09-16 – 2019-09-21 (×16): 3.375 g via INTRAVENOUS
  Filled 2019-09-16 (×16): qty 50

## 2019-09-16 MED ORDER — ENOXAPARIN SODIUM 60 MG/0.6ML ~~LOC~~ SOLN
60.0000 mg | SUBCUTANEOUS | Status: DC
  Administered 2019-09-16 – 2019-09-18 (×3): 60 mg via SUBCUTANEOUS
  Filled 2019-09-16 (×6): qty 0.6

## 2019-09-16 MED ORDER — ONDANSETRON HCL 4 MG PO TABS: 4.0000 mg | ORAL_TABLET | Freq: Four times a day (QID) | ORAL | Status: DC | PRN

## 2019-09-16 MED ORDER — ONDANSETRON HCL 4 MG/2ML IJ SOLN
4.0000 mg | Freq: Four times a day (QID) | INTRAMUSCULAR | Status: DC | PRN
  Administered 2019-09-16 – 2019-09-18 (×4): 4 mg via INTRAVENOUS
  Filled 2019-09-16 (×4): qty 2

## 2019-09-16 MED ORDER — MORPHINE SULFATE (PF) 2 MG/ML IV SOLN
2.0000 mg | INTRAVENOUS | Status: DC | PRN
  Administered 2019-09-16: 2 mg via INTRAVENOUS
  Administered 2019-09-16 (×2): 4 mg via INTRAVENOUS
  Administered 2019-09-18 (×2): 2 mg via INTRAVENOUS
  Filled 2019-09-16: qty 1
  Filled 2019-09-16: qty 2
  Filled 2019-09-16: qty 1
  Filled 2019-09-16: qty 2
  Filled 2019-09-16 (×2): qty 1

## 2019-09-16 MED ORDER — ACETAMINOPHEN 325 MG PO TABS
650.0000 mg | ORAL_TABLET | Freq: Four times a day (QID) | ORAL | Status: DC | PRN
  Administered 2019-09-16 – 2019-09-20 (×4): 650 mg via ORAL
  Filled 2019-09-16 (×4): qty 2

## 2019-09-16 MED ORDER — METOPROLOL SUCCINATE ER 50 MG PO TB24
50.0000 mg | ORAL_TABLET | Freq: Every day | ORAL | Status: DC
  Administered 2019-09-16 – 2019-09-21 (×6): 50 mg via ORAL
  Filled 2019-09-16 (×6): qty 1

## 2019-09-16 MED ORDER — LORAZEPAM 2 MG/ML IJ SOLN
0.5000 mg | INTRAMUSCULAR | Status: DC | PRN
  Administered 2019-09-16: 0.5 mg via INTRAVENOUS
  Filled 2019-09-16: qty 1

## 2019-09-16 MED ORDER — INSULIN ASPART 100 UNIT/ML ~~LOC~~ SOLN
0.0000 [IU] | SUBCUTANEOUS | Status: DC
  Administered 2019-09-16 – 2019-09-18 (×4): 1 [IU] via SUBCUTANEOUS

## 2019-09-16 MED ORDER — AMLODIPINE BESYLATE 5 MG PO TABS
5.0000 mg | ORAL_TABLET | Freq: Every day | ORAL | Status: DC
  Administered 2019-09-16 – 2019-09-21 (×6): 5 mg via ORAL
  Filled 2019-09-16 (×6): qty 1

## 2019-09-16 MED ORDER — SODIUM CHLORIDE 0.9 % IV SOLN: INTRAVENOUS | Status: DC

## 2019-09-16 NOTE — H&P (Addendum)
History and Physical    Stephania FragminFaith B Remmert AOZ:[id number] DOB: 02/22/1961 DOA: 09/15/2019  PCP: Marva PandaMillsaps, Kimberly, NP  Patient coming from: Home  I have personally briefly reviewed patient[facility]  Chief Complaint: Abd pain  HPI: Stephania FragminFaith B Elicker is a 59 y.o. female with medical history significant of Barrett esophagus, multiple episodes of diverticulitis previously, HTN, DM2.  Pt diagnosed with diverticulitis in early June while being worked up for bariatric surgery.  Shortly after being diagnosed on imaging, she developed her typical diverticulitis symptoms of LLQ pain, diarrhea.  However despite attempted outpt treatment of 3 courses of ABx including levaquin, flagyl, and cipro.  Pt has had ongoing LLQ abd pain and diarrhea.   ED Course: WBC nl, no SIRS.  CT reveals acute uncomplicated diverticulitis of junction of descending and sigmoid colon.  Pt started on zosyn and transferred to [facility].   Review of Systems: As per HPI, otherwise all review of systems negative.  Past Medical History:  Diagnosis Date  . Allergy   . Anxiety 2013   has seen Dr. Evelene CroonKaur prior  . Barrett esophagus   . Depression 2013   Dr. Evelene CroonKaur prior  . Diverticulitis 06/2013   3 total episodes  . DVT (deep vein thrombosis) in pregnancy   . GERD (gastroesophageal reflux disease)   . Hypertension   . Paroxysmal SVT (supraventricular tachycardia) (HCC)    Echo 8/19:  EF 55-60, Gr 1 DD, normal RVSF  . Vitamin D deficiency     Past Surgical History:  Procedure Laterality Date  . ABDOMINAL HYSTERECTOMY     fibroids, still has ovaries  . BREAST BIOPSY Left 2012   stereo/neg  . CHOLECYSTECTOMY    . COLONOSCOPY  2011   [place], [place]; polyp and diverticulosis  . WISDOM TOOTH EXTRACTION       reports that she has never smoked. She has never used smokeless tobacco. She reports current alcohol use of about 2.0 standard drinks of alcohol per week. She reports that she does not use  drugs.  Allergies  Allergen Reactions  . Aspirin Hives    Family History  Problem Relation Age of Onset  . Diabetes Mother   . Heart disease Mother   . Cancer Father        prostate, mets  . Diabetes Father   . Hypertension Sister   . Diabetes Sister   . Stroke Sister   . Hypertension Brother   . Obesity Brother   . Hypertension Brother   . Other Brother        died of sepsis s/p perforated bowel  . Obesity Brother   . Heart disease Sister   . Hypertension Sister   . Diabetes Sister   . Hypertension Other   . Diabetes Other   . Cancer Paternal Uncle        13 uncles all died with cancer, various types     Prior to Admission medications   Medication Sig Start Date End Date Taking? Authorizing Provider  ALPRAZolam Prudy Feeler(XANAX) 0.5 MG tablet take 1 TABLET BY MOUTH TWICE DAILY AS NEEDED for anxiety 11/13/16  Yes Tysinger, Kermit Baloavid S, PA-C  amLODipine (NORVASC) 5 MG tablet TAKE 1 TABLET BY MOUTH EVERY DAY 08/25/19  Yes Jake BatheSkains, Mark C, MD  metFORMIN (GLUCOPHAGE) 1000 MG tablet Take 1,000 mg by mouth 2 (two) times daily with a meal.   Yes [provider]  metoprolol succinate (TOPROL-XL) 50 MG 24 hr tablet Take 1 tablet (  50 mg total) by mouth daily. MAY TAKE EXTRA TABLET FOR SVT EPISODE AS NEEDED. Please make overdue appt with Dr. Anne Fu. 1st attempt 08/25/19  Yes Jake Bathe, MD  Multiple Vitamin (MULITIVITAMIN WITH MINERALS) TABS Take 1 tablet by mouth every morning.    Yes [provider]  Omeprazole-Sodium Bicarbonate (ZEGERID OTC PO) Take 1 tablet by mouth daily.   Yes [provider]  triamterene-hydrochlorothiazide (MAXZIDE-25) 37.5-25 MG tablet TAKE 1 TABLET BY MOUTH EVERY DAY 07/19/19  Yes Leone Brand, NP    Physical Exam: Vitals:   09/15/19 2029 09/15/19 2119 09/15/19 2254 09/16/19 0105  BP: 131/82 131/82 126/69 (!) 148/96  Pulse: 84 81 87 93  Resp: 15 15 16 20   Temp:    98.2 F (36.8 C)  TempSrc:    Oral  SpO2: 98% 96% 96% 95%  Weight:       Height:        Constitutional: NAD, calm, comfortable Eyes: PERRL, lids and conjunctivae normal ENMT: Mucous membranes are moist. Posterior pharynx clear of any exudate or lesions.Normal dentition.  Neck: normal, supple, no masses, no thyromegaly Respiratory: clear to auscultation bilaterally, no wheezing, no crackles. Normal respiratory effort. No accessory muscle use.  Cardiovascular: Regular rate and rhythm, no murmurs / rubs / gallops. No extremity edema. 2+ pedal pulses. No carotid bruits.  Abdomen: LLQ TTP. Musculoskeletal: no clubbing / cyanosis. No joint deformity upper and lower extremities. Good ROM, no contractures. Normal muscle tone.  Skin: no rashes, lesions, ulcers. No induration Neurologic: CN 2-12 grossly intact. Sensation intact, DTR normal. Strength 5/5 in all 4.  Psychiatric: Normal judgment and insight. Alert and oriented x 3. Normal mood.    Labs on Admission: I have personally reviewed following labs and imaging studies  CBC: Recent Labs  Lab 09/15/19 1823  WBC 10.3  HGB 13.5  HCT 42.1  MCV 80.7  PLT 327   Basic Metabolic Panel: Recent Labs  Lab 09/15/19 1823  NA 135  K 3.8  CL 98  CO2 25  GLUCOSE 135*  BUN 10  CREATININE 0.83  CALCIUM 9.5   GFR: Estimated Creatinine Clearance: 95.2 mL/min (by C-G formula based on SCr of 0.83 mg/dL). Liver Function Tests: Recent Labs  Lab 09/15/19 1823  AST 41  ALT 31  ALKPHOS 60  BILITOT 0.2*  PROT 8.9*  ALBUMIN 3.8   Recent Labs  Lab 09/15/19 1823  LIPASE 28   No results for input(s): AMMONIA in the last 168 hours. Coagulation Profile: No results for input(s): INR, PROTIME in the last 168 hours. Cardiac Enzymes: No results for input(s): CKTOTAL, CKMB, CKMBINDEX, TROPONINI in the last 168 hours. BNP (last 3 results) No results for input(s): PROBNP in the last 8760 hours. HbA1C: No results for input(s): HGBA1C in the last 72 hours. CBG: No results for input(s): GLUCAP in the last 168  hours. Lipid Profile: No results for input(s): CHOL, HDL, LDLCALC, TRIG, CHOLHDL, LDLDIRECT in the last 72 hours. Thyroid Function Tests: No results for input(s): TSH, T4TOTAL, FREET4, T3FREE, THYROIDAB in the last 72 hours. Anemia Panel: No results for input(s): VITAMINB12, FOLATE, FERRITIN, TIBC, IRON, RETICCTPCT in the last 72 hours. Urine analysis:    Component Value Date/Time   COLORURINE YELLOW 09/15/2019 1714   APPEARANCEUR CLEAR 09/15/2019 1714   LABSPEC 1.020 09/15/2019 1714   PHURINE 6.0 09/15/2019 1714   GLUCOSEU NEGATIVE 09/15/2019 1714   HGBUR TRACE (A) 09/15/2019 1714   BILIRUBINUR NEGATIVE 09/15/2019 1714   BILIRUBINUR n  08/06/2015 1242   KETONESUR NEGATIVE 09/15/2019 1714   PROTEINUR 100 (A) 09/15/2019 1714   UROBILINOGEN negative 08/06/2015 1242   UROBILINOGEN 0.2 05/05/2014 1955   NITRITE NEGATIVE 09/15/2019 1714   LEUKOCYTESUR SMALL (A) 09/15/2019 1714    Radiological Exams on Admission: CT ABDOMEN PELVIS W CONTRAST  Result Date: 09/15/2019 CLINICAL DATA:  Left lower quadrant pain for 3 weeks, worse over the past few days. Diverticulitis suspected. EXAM: CT ABDOMEN AND PELVIS WITH CONTRAST TECHNIQUE: Multidetector CT imaging of the abdomen and pelvis was performed using the standard protocol following bolus administration of intravenous contrast. CONTRAST:  OMNIPAQUE IOHEXOL 300 MG/ML  SOLN COMPARISON:  CT 07/13/2018 FINDINGS: Lower chest: Minor subsegmental atelectasis in the left lung base. Moderate to large hiatal hernia. Hepatobiliary: Enlarged liver spanning 24 cm cranial caudal. Postcholecystectomy. Common bile duct measures 17 mm at the porta hepatis with normal tapering. This appears similar to prior exam. Pancreas: No ductal dilatation or inflammation. Spleen: Normal in size without focal abnormality. Adrenals/Urinary Tract: Normal adrenal glands. Lobulated renal contours without hydronephrosis. No significant perinephric edema. There is symmetric  excretion on delayed phase imaging. Urinary bladder is partially distended. No bladder wall thickening. Stomach/Bowel: Moderately large hiatal hernia. Greater than 50% of the stomach is intrathoracic. Normal positioning of the duodenum. There is no small bowel obstruction or inflammatory change. Normal appendix. Multifocal colonic diverticulosis, throughout the entire colon. Diverticular most prominent in the sigmoid colon. Minimal faint. Diverticular fat stranding at the junction of the descending and sigmoid, series 2, image 62. No perforation or abscess. Vascular/Lymphatic: Aortic atherosclerosis. No aortic aneurysm. Patent portal vein. Multiple small retroperitoneal nodes measure up to 7 mm. 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 There are no enlarged lymph nodes in the abdomen or pelvis. Reproductive: Uterus surgically absent. Ovaries are quiescent. No adnexal mass. Other: No free air, free fluid, or intra-abdominal fluid collection. Musculoskeletal: Spine degenerative change with primarily facet hypertrophy. There is degenerative disc disease at L5-S1. IMPRESSION: 1. Acute uncomplicated diverticulitis at the junction of the descending and sigmoid colon. No perforation or abscess. 2. Moderately large hiatal hernia. 3. Hepatomegaly. Aortic Atherosclerosis (ICD10-I70.0). Electronically Signed   By: Narda Rutherford M.D.   On: 09/15/2019 22:14    EKG: Independently reviewed.  Assessment/Plan Principal Problem:   Acute diverticulitis Active Problems:   Essential hypertension   Impaired fasting blood sugar    1. Acute uncomplicated diverticulitis - but failed outpt therapy. 1. Zosyn 2. NPO 3. IVF: NS at 125 4. Daily CBC/BMP 5. May wish to get gen surg consult, though I imagine they will want her to fail IV ABx before considering anything surgical for uncomplicated diverticulitis. 2. HTN - cont home BP meds, but hold triamterene /  HCTZ while pt NPO 3. DM2 - 1. Hold metformin 2. SSI sensitive Q4H  DVT prophylaxis: Lovenox Code Status: Full Family Communication: No family in room Disposition Plan: Home after admit Consults called: None Admission status: Place in obs - think she should meet IP criteria though as she has failed outpt therapy.   Cinch Ormond Judie Petit DO [facility]  How to contact the [facility] Attending or Consulting provider 7A - 7P or covering provider during after hours 7P -7A, for this patient?  1. Check the care team in [facility] and look for a) attending/consulting [facility] provider listed and b) the [facility] team listed 2. Log into [email or web address]  Amion Physician Scheduling and messaging for groups and whole hospitals  On call and physician scheduling software for group practices, residents, hospitalists and other medical providers for call, clinic, rotation and shift schedules. On[employer] is a hospital-wide system for scheduling doctors and paging doctors on call. EasyPlot is for scientific plotting and data analysis.  [email or web address]  and use [place]'s universal password to access. If you do not have the password, please contact the hospital operator.  3. Locate the [facility] provider you are looking for under [facility] and page to a number that you can be directly reached. 4. If you still have difficulty reaching the provider, please page the [facility] (Director on Call) for the Hospitalists listed on amion for assistance.  09/16/2019, 1:33 AM

## 2019-09-16 NOTE — Plan of Care (Signed)

## 2019-09-16 NOTE — Progress Notes (Signed)
PROGRESS NOTE    Yolanda Brown  HCW:[id number] DOB: 1960/11/29 DOA: 09/15/2019 PCP: Yolanda Panda, NP   Brief Narrative:  HPIPER ADMITTING MD : Yolanda Brown is a 59 y.o. female with medical history significant of Barrett esophagus, multiple episodes of diverticulitis previously, HTN, DM2.  Pt diagnosed with diverticulitis in early June while being worked up for bariatric surgery.  Shortly after being diagnosed on imaging, she developed her typical diverticulitis symptoms of LLQ pain, diarrhea.  However despite attempted outpt treatment of 3 courses of ABx including levaquin, flagyl, and cipro.  Pt has had ongoing LLQ abd pain and diarrhea.   ED Course: WBC nl, no SIRS.  CT reveals acute uncomplicated diverticulitis of junction of descending and sigmoid colon.  Pt started on zosyn and transferred to [facility].  PT IS A TRAVELLING NURSE   Assessment & Plan:   Principal Problem:   Acute diverticulitis Active Problems:   Essential hypertension   Impaired fasting blood sugar   1. Acute uncomplicated diverticulitis - but failed outpt therapy. 1. C/W Zosyn 2. NPO 3. IVF: NS at 125 4. Daily CBC/BMP 5. Will hold off on gen surg consult for now,I agree they will want her to fail IV ABx before considering anything surgical for uncomplicated diverticulitis. 2. HTN - cont home BP meds, but hold triamterene / HCTZ while pt NPO, monitor lytes and volume closely 3. DM2 - 1. Hold metformin 2. SSI sensitive Q4H   DVT prophylaxis: Lovenox SQ  Code Status: FULL    Code Status Orders  (From admission, onward)         Start     Ordered   09/16/19 0122  Full code  Continuous        09/16/19 0121        Code Status History    This patient has a current code status but no historical code status.   Advance Care Planning Activity     Family Communication: NONE TODAY  Disposition Plan:   Status is: Inpatient  Remains inpatient appropriate because:IV treatments appropriate  due to intensity of illness or inability to take PO   Dispo: The patient is from: Home              Anticipated d/c is to: Home              Anticipated d/c date is: 2 days              Patient currently is not medically stable to d/c.        Consults called: None Admission status: Inpatient   Consultants:   None  Procedures:  CT ABDOMEN PELVIS W CONTRAST  Result Date: 09/15/2019 CLINICAL DATA:  Left lower quadrant pain for 3 weeks, worse over the past few days. Diverticulitis suspected. EXAM: CT ABDOMEN AND PELVIS WITH CONTRAST TECHNIQUE: Multidetector CT imaging of the abdomen and pelvis was performed using the standard protocol following bolus administration of intravenous contrast. CONTRAST:  OMNIPAQUE IOHEXOL 300 MG/ML  SOLN COMPARISON:  CT 07/13/2018 FINDINGS: Lower chest: Minor subsegmental atelectasis in the left lung base. Moderate to large hiatal hernia. Hepatobiliary: Enlarged liver spanning 24 cm cranial caudal. Postcholecystectomy. Common bile duct measures 17 mm at the porta hepatis with normal tapering. This appears similar to prior exam. Pancreas: No ductal dilatation or inflammation. Spleen: Normal in size without focal abnormality. Adrenals/Urinary Tract: Normal adrenal glands. Lobulated renal contours without hydronephrosis. No significant perinephric edema. There is symmetric excretion on delayed phase imaging.  Urinary bladder is partially distended. No bladder wall thickening. Stomach/Bowel: Moderately large hiatal hernia. Greater than 50% of the stomach is intrathoracic. Normal positioning of the duodenum. There is no small bowel obstruction or inflammatory change. Normal appendix. Multifocal colonic diverticulosis, throughout the entire colon. Diverticular most prominent in the sigmoid colon. Minimal faint. Diverticular fat stranding at the junction of the descending and sigmoid, series 2, image 62. No perforation or abscess. Vascular/Lymphatic: Aortic  atherosclerosis. No aortic aneurysm. Patent portal vein. Multiple small retroperitoneal nodes measure up to 7 mm. 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 There are no enlarged lymph nodes in the abdomen or pelvis. Reproductive: Uterus surgically absent. Ovaries are quiescent. No adnexal mass. Other: No free air, free fluid, or intra-abdominal fluid collection. Musculoskeletal: Spine degenerative change with primarily facet hypertrophy. There is degenerative disc disease at L5-S1. IMPRESSION: 1. Acute uncomplicated diverticulitis at the junction of the descending and sigmoid colon. No perforation or abscess. 2. Moderately large hiatal hernia. 3. Hepatomegaly. Aortic Atherosclerosis (ICD10-I70.0). Electronically Signed   By: Narda Rutherford M.D.   On: 09/15/2019 22:14     Antimicrobials:   ZOSYN DAY 2    Subjective: PT REPORTS STILL WITH ABD PAIN THOUGH IMPROVED SINCE ADMISSION  Objective: Vitals:   09/16/19 0500 09/16/19 0502 09/16/19 0903 09/16/19 1323  BP: (!) 143/90  130/77 105/61  Pulse: 100  95 78  Resp: 20  18 18   Temp: 98.2 F (36.8 C)  98.1 F (36.7 C) 97.8 F (36.6 C)  TempSrc: Oral  Oral   SpO2: (!) 89% 94% 93% 93%  Weight:      Height:        Intake/Output Summary (Last 24 hours) at 09/16/2019 1503 Last data filed at 09/16/2019 0600 Gross per 24 hour  Intake 1047.89 ml  Output --  Net 1047.89 ml   Filed Weights   09/15/19 1658 09/16/19 0200  Weight: 122.9 kg 123.6 kg    Examination:  General exam: Appears calm and comfortable  Respiratory system: Clear to auscultation. Respiratory effort normal. Cardiovascular system: S1 & S2 heard, RRR. No JVD, murmurs, rubs, gallops or clicks. No pedal edema. Gastrointestinal system: Abdomen is nondistended, soft BUT MILDLY tender. No organomegaly or masses felt. Normal bowel sounds heard. Central nervous system: Alert and oriented. No focal neurological  deficits. Extremities: WWP, no contractures Skin: No rashes, lesions or ulcers Psychiatry: Judgement and insight appear normal. Mood & affect appropriate.     Data Reviewed: I have personally reviewed following labs and imaging studies  CBC: Recent Labs  Lab 09/15/19 1823 09/16/19 0354  WBC 10.3 8.0  HGB 13.5 12.5  HCT 42.1 39.4  MCV 80.7 80.7  PLT 327 309   Basic Metabolic Panel: Recent Labs  Lab 09/15/19 1823 09/16/19 0354  NA 135 135  K 3.8 3.9  CL 98 101  CO2 25 25  GLUCOSE 135* 129*  BUN 10 9  CREATININE 0.83 0.83  CALCIUM 9.5 9.2   GFR: Estimated Creatinine Clearance: 95.5 mL/min (by C-G formula based on SCr of 0.83 mg/dL). Liver Function Tests: Recent Labs  Lab 09/15/19 1823  AST 41  ALT 31  ALKPHOS 60  BILITOT 0.2*  PROT 8.9*  ALBUMIN 3.8   Recent Labs  Lab 09/15/19 1823  LIPASE 28   No results for input(s): AMMONIA in the last 168 hours. Coagulation Profile: No results for input(s): INR, PROTIME in the last 168 hours. Cardiac Enzymes: No results for input(s):  CKTOTAL, CKMB, CKMBINDEX, TROPONINI in the last 168 hours. BNP (last 3 results) No results for input(s): PROBNP in the last 8760 hours. HbA1C: Recent Labs    09/16/19 0354  HGBA1C 7.3*   CBG: Recent Labs  Lab 09/16/19 0502 09/16/19 0737 09/16/19 1139  GLUCAP 137* 120* 116*   Lipid Profile: No results for input(s): CHOL, HDL, LDLCALC, TRIG, CHOLHDL, LDLDIRECT in the last 72 hours. Thyroid Function Tests: No results for input(s): TSH, T4TOTAL, FREET4, T3FREE, THYROIDAB in the last 72 hours. Anemia Panel: No results for input(s): VITAMINB12, FOLATE, FERRITIN, TIBC, IRON, RETICCTPCT in the last 72 hours. Sepsis Labs: No results for input(s): PROCALCITON, LATICACIDVEN in the last 168 hours.  Recent Results (from the past 240 hour(s))  SARS Coronavirus 2 by RT PCR (hospital order, performed in [facility] hospital lab) Nasopharyngeal Nasopharyngeal Swab     Status: None    Collection Time: 09/15/19 10:53 PM   Specimen: Nasopharyngeal Swab  Result Value Ref Range Status   SARS Coronavirus 2 NEGATIVE NEGATIVE Final    Comment: (NOTE) SARS-CoV-2 target nucleic acids are NOT DETECTED.  The SARS-CoV-2 RNA is generally detectable in upper and lower respiratory specimens during the acute phase of infection. The lowest concentration of SARS-CoV-2 viral copies this assay can detect is 250 copies / mL. A negative result does not preclude SARS-CoV-2 infection and should not be used as the sole basis for treatment or other patient management decisions.  A negative result may occur with improper specimen collection / handling, submission of specimen other than nasopharyngeal swab, presence of viral mutation(s) within the areas targeted by this assay, and inadequate number of viral copies (<250 copies / mL). A negative result must be combined with clinical observations, patient history, and epidemiological information.  Fact Sheet for Patients:   BoilerBrush.com.cy  Fact Sheet for Healthcare Providers: https://pope.com/  This test is not yet approved or  cleared by the [place] FDA and has been authorized for detection and/or diagnosis of SARS-CoV-2 by FDA under an Emergency Use Authorization (EUA).  This EUA will remain in effect (meaning this test can be used) for the duration of the COVID-19 declaration under Section 564(b)(1) of the Act, 21 U.S.C. section 360bbb-3(b)(1), unless the authorization is terminated or revoked sooner.  Performed at [facility], [address], [place], [place] [address]          Radiology Studies: CT ABDOMEN PELVIS W CONTRAST  Result Date: 09/15/2019 CLINICAL DATA:  Left lower quadrant pain for 3 weeks, worse over the past few days. Diverticulitis suspected. EXAM: CT ABDOMEN AND PELVIS WITH CONTRAST TECHNIQUE: Multidetector CT imaging of the abdomen and pelvis  was performed using the standard protocol following bolus administration of intravenous contrast. CONTRAST:  OMNIPAQUE IOHEXOL 300 MG/ML  SOLN COMPARISON:  CT 07/13/2018 FINDINGS: Lower chest: Minor subsegmental atelectasis in the left lung base. Moderate to large hiatal hernia. Hepatobiliary: Enlarged liver spanning 24 cm cranial caudal. Postcholecystectomy. Common bile duct measures 17 mm at the porta hepatis with normal tapering. This appears similar to prior exam. Pancreas: No ductal dilatation or inflammation. Spleen: Normal in size without focal abnormality. Adrenals/Urinary Tract: Normal adrenal glands. Lobulated renal contours without hydronephrosis. No significant perinephric edema. There is symmetric excretion on delayed phase imaging. Urinary bladder is partially distended. No bladder wall thickening. Stomach/Bowel: Moderately large hiatal hernia. Greater than 50% of the stomach is intrathoracic. Normal positioning of the duodenum. There is no small bowel obstruction or inflammatory change. Normal appendix.  Multifocal colonic diverticulosis, throughout the entire colon. Diverticular most prominent in the sigmoid colon. Minimal faint. Diverticular fat stranding at the junction of the descending and sigmoid, series 2, image 62. No perforation or abscess. Vascular/Lymphatic: Aortic atherosclerosis. No aortic aneurysm. Patent portal vein. Multiple small retroperitoneal nodes measure up to 7 mm. 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 There are no enlarged lymph nodes in the abdomen or pelvis. Reproductive: Uterus surgically absent. Ovaries are quiescent. No adnexal mass. Other: No free air, free fluid, or intra-abdominal fluid collection. Musculoskeletal: Spine degenerative change with primarily facet hypertrophy. There is degenerative disc disease at L5-S1. IMPRESSION: 1. Acute uncomplicated diverticulitis at the junction of the  descending and sigmoid colon. No perforation or abscess. 2. Moderately large hiatal hernia. 3. Hepatomegaly. Aortic Atherosclerosis (ICD10-I70.0). Electronically Signed   By: Narda RutherfordMelanie  Sanford M.D.   On: 09/15/2019 22:14        Scheduled Meds: . amLODipine  5 mg Oral Daily  . enoxaparin (LOVENOX) injection  60 mg Subcutaneous Q24H  . insulin aspart  0-9 Units Subcutaneous Q4H  . metoprolol succinate  50 mg Oral Daily   Continuous Infusions: . sodium chloride 125 mL/hr at 09/16/19 0221  . piperacillin-tazobactam (ZOSYN)  IV 3.375 g (09/16/19 1248)     LOS: 0 days    Time spent: 35 min    Burke Keelshristopher Lue Sykora, MD [facility]  If 7PM-7AM, please contact night-coverage  09/16/2019, 3:03 PM

## 2019-09-16 NOTE — Progress Notes (Signed)
Pharmacy Antibiotic Note  Yolanda Brown is a 59 y.o. female admitted on 09/15/2019 with intra-abdominal infection.  Pharmacy has been consulted for Zosyn dosing.  Plan: Zosyn 3.375g IV q8h (4 hour infusion).  Height: 5\' 6"  (167.6 cm) Weight: 122.9 kg (271 lb) IBW/kg (Calculated) : 59.3  Temp (24hrs), Avg:98.4 F (36.9 C), Min:98.2 F (36.8 C), Max:98.5 F (36.9 C)  Recent Labs  Lab 09/15/19 1823  WBC 10.3  CREATININE 0.83    Estimated Creatinine Clearance: 95.2 mL/min (by C-G formula based on SCr of 0.83 mg/dL).    Allergies  Allergen Reactions  . Aspirin Hives    Antimicrobials this admission: 7/9 Zosyn >>    Dose adjustments this admission:   Microbiology results: 7/9 Covid: negative 7/9 UCx: sent   Thank you for allowing pharmacy to be a part of this patient's care.  9/9, PharmD 09/16/2019 1:25 AM

## 2019-09-17 DIAGNOSIS — E1169 Type 2 diabetes mellitus with other specified complication: Secondary | ICD-10-CM

## 2019-09-17 LAB — CBC WITH DIFFERENTIAL/PLATELET
Abs Immature Granulocytes: 0.01 10*3/uL (ref 0.00–0.07)
Basophils Absolute: 0.1 10*3/uL (ref 0.0–0.1)
Basophils Relative: 1 %
Eosinophils Absolute: 0.4 10*3/uL (ref 0.0–0.5)
Eosinophils Relative: 6 %
HCT: 39.5 % (ref 36.0–46.0)
Hemoglobin: 12.5 g/dL (ref 12.0–15.0)
Immature Granulocytes: 0 %
Lymphocytes Relative: 41 %
Lymphs Abs: 2.9 10*3/uL (ref 0.7–4.0)
MCH: 25.7 pg — ABNORMAL LOW (ref 26.0–34.0)
MCHC: 31.6 g/dL (ref 30.0–36.0)
MCV: 81.1 fL (ref 80.0–100.0)
Monocytes Absolute: 0.7 10*3/uL (ref 0.1–1.0)
Monocytes Relative: 9 %
Neutro Abs: 3.1 10*3/uL (ref 1.7–7.7)
Neutrophils Relative %: 43 %
Platelets: 301 10*3/uL (ref 150–400)
RBC: 4.87 MIL/uL (ref 3.87–5.11)
RDW: 16 % — ABNORMAL HIGH (ref 11.5–15.5)
WBC: 7.1 10*3/uL (ref 4.0–10.5)
nRBC: 0 % (ref 0.0–0.2)

## 2019-09-17 LAB — BASIC METABOLIC PANEL
Anion gap: 11 (ref 5–15)
BUN: 7 mg/dL — ABNORMAL LOW (ref 8–23)
CO2: 27 mmol/L (ref 22–32)
Calcium: 9 mg/dL (ref 8.9–10.3)
Chloride: 103 mmol/L (ref 98–111)
Creatinine, Ser: 0.87 mg/dL (ref 0.44–1.00)
GFR calc Af Amer: >60 mL/min (ref >60)
GFR calc non Af Amer: >60 mL/min (ref >60)
Glucose, Bld: 117 mg/dL — ABNORMAL HIGH (ref 70–99)
Potassium: 3.9 mmol/L (ref 3.5–5.1)
Sodium: 141 mmol/L (ref 135–145)

## 2019-09-17 LAB — GLUCOSE, CAPILLARY
Glucose-Capillary: 116 mg/dL — ABNORMAL HIGH (ref 70–99)
Glucose-Capillary: 120 mg/dL — ABNORMAL HIGH (ref 70–99)
Glucose-Capillary: 120 mg/dL — ABNORMAL HIGH (ref 70–99)
Glucose-Capillary: 121 mg/dL — ABNORMAL HIGH (ref 70–99)
Glucose-Capillary: 125 mg/dL — ABNORMAL HIGH (ref 70–99)
Glucose-Capillary: 129 mg/dL — ABNORMAL HIGH (ref 70–99)
Glucose-Capillary: 149 mg/dL — ABNORMAL HIGH (ref 70–99)

## 2019-09-17 LAB — URINE CULTURE: Culture: NO GROWTH

## 2019-09-17 NOTE — Progress Notes (Signed)
This writer was alerted by patient that she smelled a foul odor from the bathroom. This Clinical research associate had maintenance called and made charge nurse aware.

## 2019-09-17 NOTE — Progress Notes (Signed)
Report received from Vera RN around 1:30 am on 09/17/19. I will continue to resume care. 

## 2019-09-17 NOTE — Progress Notes (Signed)
PROGRESS NOTE    Yolanda Brown  MVE:[id number] DOB: [birth year] DOA: 09/15/2019 PCP: Marva Panda, NP   Brief Narrative:  HPI PER ADMITTING MD :Yolanda Garbett [name] a 59 y.o.femalewith medical history significant ofBarrett esophagus, multiple episodes of diverticulitis previously, HTN, DM2.  Pt diagnosed with diverticulitis in early June while being worked up for bariatric surgery.  Shortly after being diagnosed on imaging, she developed her typical diverticulitis symptoms of LLQ pain, diarrhea.  However despite attempted outpt treatment of 3 courses of ABx including levaquin, flagyl, and cipro. Pt has had ongoing LLQ abd pain and diarrhea.   ED Course:WBC nl, no SIRS. CT reveals acute uncomplicated diverticulitis of junction of descending and sigmoid colon. Pt started on zosyn and transferred to [facility].  PT IS A TRAVELLING NURSE   Assessment & Plan:   Principal Problem:   Acute diverticulitis Active Problems:   Essential hypertension   Impaired fasting blood sugar   Diarrhea   1. Acute uncomplicated diverticulitis - but failed outpt therapy. 1. C/W Zosyn 2. Will try clears today-advance slowly 3. IVF: NS decreased to 100 4. Daily CBC/BMP 5. Will hold off on gen surg consult for now,I agree they will want her to fail IV ABx before considering anything surgical for uncomplicated diverticulitis. 2. HTN - cont home BP meds, but hold triamterene / HCTZ  monitor lytes and volume closely-likley add back in tomorrow 3. DM2 - 1. Hold metformin 2. SSI sensitive Q4H  DVT prophylaxis: Lovenox SQ  Code Status: full    Code Status Orders  (From admission, onward)         Start     Ordered   09/16/19 0122  Full code  Continuous        09/16/19 0121        Code Status History    This patient has a current code status but no historical code status.   Advance Care Planning Activity     Family Communication: none today  Disposition Plan:   Status is:  Inpatient  Remains inpatient appropriate because:IV treatments appropriate due to intensity of illness or inability to take PO   Dispo: The patient is from: Home              Anticipated d/c is to: Home              Anticipated d/c date is: 2 days              Patient currently is not medically stable to d/c.       Consults called: None Admission status: Inpatient   Consultants:   None  Procedures:  CT ABDOMEN PELVIS W CONTRAST  Result Date: 09/15/2019 CLINICAL DATA:  Left lower quadrant pain for 3 weeks, worse over the past few days. Diverticulitis suspected. EXAM: CT ABDOMEN AND PELVIS WITH CONTRAST TECHNIQUE: Multidetector CT imaging of the abdomen and pelvis was performed using the standard protocol following bolus administration of intravenous contrast. CONTRAST:  OMNIPAQUE IOHEXOL 300 MG/ML  SOLN COMPARISON:  CT 07/13/2018 FINDINGS: Lower chest: Minor subsegmental atelectasis in the left lung base. Moderate to large hiatal hernia. Hepatobiliary: Enlarged liver spanning 24 cm cranial caudal. Postcholecystectomy. Common bile duct measures 17 mm at the porta hepatis with normal tapering. This appears similar to prior exam. Pancreas: No ductal dilatation or inflammation. Spleen: Normal in size without focal abnormality. Adrenals/Urinary Tract: Normal adrenal glands. Lobulated renal contours without hydronephrosis. No significant perinephric edema. There is symmetric excretion on delayed phase imaging.  Urinary bladder is partially distended. No bladder wall thickening. Stomach/Bowel: Moderately large hiatal hernia. Greater than 50% of the stomach is intrathoracic. Normal positioning of the duodenum. There is no small bowel obstruction or inflammatory change. Normal appendix. Multifocal colonic diverticulosis, throughout the entire colon. Diverticular most prominent in the sigmoid colon. Minimal faint. Diverticular fat stranding at the junction of the descending and sigmoid, series 2,  image 62. No perforation or abscess. Vascular/Lymphatic: Aortic atherosclerosis. No aortic aneurysm. Patent portal vein. Multiple small retroperitoneal nodes measure up to 7 mm. 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 There are no enlarged lymph nodes in the abdomen or pelvis. Reproductive: Uterus surgically absent. Ovaries are quiescent. No adnexal mass. Other: No free air, free fluid, or intra-abdominal fluid collection. Musculoskeletal: Spine degenerative change with primarily facet hypertrophy. There is degenerative disc disease at L5-S1. IMPRESSION: 1. Acute uncomplicated diverticulitis at the junction of the descending and sigmoid colon. No perforation or abscess. 2. Moderately large hiatal hernia. 3. Hepatomegaly. Aortic Atherosclerosis (ICD10-I70.0). Electronically Signed   By: Narda Rutherford M.D.   On: 09/15/2019 22:14     Antimicrobials:   Zosyn day 3   Subjective: Patient reports abdominal pain present still mild nausea but no vomiting Ports she feels that she is slowly improving  Objective: Vitals:   09/16/19 0903 09/16/19 1323 09/16/19 1944 09/17/19 0352  BP: 130/77 105/61 137/74 130/65  Pulse: 95 78 79 85  Resp: 18 18 20 20   Temp: 98.1 F (36.7 C) 97.8 F (36.6 C) 98.5 F (36.9 C) 98.4 F (36.9 C)  TempSrc: Oral  Oral Oral  SpO2: 93% 93% 96% 94%  Weight:      Height:        Intake/Output Summary (Last 24 hours) at 09/17/2019 1130 Last data filed at 09/17/2019 0602 Gross per 24 hour  Intake 2093.75 ml  Output --  Net 2093.75 ml   Filed Weights   09/15/19 1658 09/16/19 0200  Weight: 122.9 kg 123.6 kg    Examination:   General exam: Appears calm and comfortable  Respiratory system: Clear to auscultation. Respiratory effort normal. Cardiovascular system: S1 & S2 heard, RRR. No JVD, murmurs, rubs, gallops or clicks. No pedal edema. Gastrointestinal system: Abdomen is nondistended, soft BUT  STILL MILDLY tender. No organomegaly or masses felt. Normal bowel sounds heard. Central nervous system: Alert and oriented. No focal neurological deficits. Extremities: WWP, no contractures Skin: No rashes, lesions or ulcers Psychiatry: Judgement and insight appear normal. Mood & affect appropriate    Data Reviewed: I have personally reviewed following labs and imaging studies  CBC: Recent Labs  Lab 09/15/19 1823 09/16/19 0354 09/17/19 0815  WBC 10.3 8.0 7.1  NEUTROABS  --   --  3.1  HGB 13.5 12.5 12.5  HCT 42.1 39.4 39.5  MCV 80.7 80.7 81.1  PLT 327 309 301   Basic Metabolic Panel: Recent Labs  Lab 09/15/19 1823 09/16/19 0354 09/17/19 0815  NA 135 135 141  K 3.8 3.9 3.9  CL 98 101 103  CO2 25 25 27   GLUCOSE 135* 129* 117*  BUN 10 9 7*  CREATININE 0.83 0.83 0.87  CALCIUM 9.5 9.2 9.0   GFR: Estimated Creatinine Clearance: 91.1 mL/min (by C-G formula based on SCr of 0.87 mg/dL). Liver Function Tests: Recent Labs  Lab 09/15/19 1823  AST 41  ALT 31  ALKPHOS 60  BILITOT 0.2*  PROT 8.9*  ALBUMIN 3.8   Recent Labs  Lab  09/15/19 1823  LIPASE 28   No results for input(s): AMMONIA in the last 168 hours. Coagulation Profile: No results for input(s): INR, PROTIME in the last 168 hours. Cardiac Enzymes: No results for input(s): CKTOTAL, CKMB, CKMBINDEX, TROPONINI in the last 168 hours. BNP (last 3 results) No results for input(s): PROBNP in the last 8760 hours. HbA1C: Recent Labs    09/16/19 0354  HGBA1C 7.3*   CBG: Recent Labs  Lab 09/16/19 1557 09/16/19 1948 09/17/19 0001 09/17/19 0355 09/17/19 0740  GLUCAP 119* 121* 120* 121* 116*   Lipid Profile: No results for input(s): CHOL, HDL, LDLCALC, TRIG, CHOLHDL, LDLDIRECT in the last 72 hours. Thyroid Function Tests: No results for input(s): TSH, T4TOTAL, FREET4, T3FREE, THYROIDAB in the last 72 hours. Anemia Panel: No results for input(s): VITAMINB12, FOLATE, FERRITIN, TIBC, IRON, RETICCTPCT in the  last 72 hours. Sepsis Labs: No results for input(s): PROCALCITON, LATICACIDVEN in the last 168 hours.  Recent Results (from the past 240 hour(s))  Urine culture     Status: None   Collection Time: 09/15/19  5:14 PM   Specimen: Urine, Random  Result Value Ref Range Status   Specimen Description   Final    URINE, RANDOM Performed at [facility] [place], [address] [place], [place] [address]    Special Requests   Final    NONE Performed at [facility] [place], [address] [place], [place] [address]    Culture   Final    NO GROWTH Performed at [facility], [address] [place], [place] [address]    Report Status 09/17/2019 FINAL  Final  SARS Coronavirus 2 by RT PCR (hospital order, performed in [facility] hospital lab) Nasopharyngeal Nasopharyngeal Swab     Status: None   Collection Time: 09/15/19 10:53 PM   Specimen: Nasopharyngeal Swab  Result Value Ref Range Status   SARS Coronavirus 2 NEGATIVE NEGATIVE Final    Comment: (NOTE) SARS-CoV-2 target nucleic acids are NOT DETECTED.  The SARS-CoV-2 RNA is generally detectable in upper and lower respiratory specimens during the acute phase of infection. The lowest concentration of SARS-CoV-2 viral copies this assay can detect is 250 copies / mL. A negative result does not preclude SARS-CoV-2 infection and should not be used as the sole basis for treatment or other patient management decisions.  A negative result may occur with improper specimen collection / handling, submission of specimen other than nasopharyngeal swab, presence of viral mutation(s) within the areas targeted by this assay, and inadequate number of viral copies (<250 copies / mL). A negative result must be combined with clinical observations, patient history, and epidemiological information.  Fact Sheet for Patients:   BoilerBrush.com.cyhttps://www.fda.gov/media/136312/download  Fact Sheet for Healthcare  Providers: https://pope.com/https://www.fda.gov/media/136313/download  This test is not yet approved or  cleared by the [place] FDA and has been authorized for detection and/or diagnosis of SARS-CoV-2 by FDA under an Emergency Use Authorization (EUA).  This EUA will remain in effect (meaning this test can be used) for the duration of the COVID-19 declaration under Section 564(b)(1) of the Act, 21 U.S.C. section 360bbb-3(b)(1), unless the authorization is terminated or revoked sooner.  Performed at [facility] [place], [address] [place], [place] [address]          Radiology Studies: CT ABDOMEN PELVIS W CONTRAST  Result Date: 09/15/2019 CLINICAL DATA:  Left lower quadrant pain for 3 weeks, worse over the past few days. Diverticulitis suspected. EXAM: CT  ABDOMEN AND PELVIS WITH CONTRAST TECHNIQUE: Multidetector CT imaging of the abdomen and pelvis was performed using the standard protocol following bolus administration of intravenous contrast. CONTRAST:  OMNIPAQUE IOHEXOL 300 MG/ML  SOLN COMPARISON:  CT 07/13/2018 FINDINGS: Lower chest: Minor subsegmental atelectasis in the left lung base. Moderate to large hiatal hernia. Hepatobiliary: Enlarged liver spanning 24 cm cranial caudal. Postcholecystectomy. Common bile duct measures 17 mm at the porta hepatis with normal tapering. This appears similar to prior exam. Pancreas: No ductal dilatation or inflammation. Spleen: Normal in size without focal abnormality. Adrenals/Urinary Tract: Normal adrenal glands. Lobulated renal contours without hydronephrosis. No significant perinephric edema. There is symmetric excretion on delayed phase imaging. Urinary bladder is partially distended. No bladder wall thickening. Stomach/Bowel: Moderately large hiatal hernia. Greater than 50% of the stomach is intrathoracic. Normal positioning of the duodenum. There is no small bowel obstruction or inflammatory change. Normal appendix. Multifocal colonic  diverticulosis, throughout the entire colon. Diverticular most prominent in the sigmoid colon. Minimal faint. Diverticular fat stranding at the junction of the descending and sigmoid, series 2, image 62. No perforation or abscess. Vascular/Lymphatic: Aortic atherosclerosis. No aortic aneurysm. Patent portal vein. Multiple small retroperitoneal nodes measure up to 7 mm. 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 There are no enlarged lymph nodes in the abdomen or pelvis. Reproductive: Uterus surgically absent. Ovaries are quiescent. No adnexal mass. Other: No free air, free fluid, or intra-abdominal fluid collection. Musculoskeletal: Spine degenerative change with primarily facet hypertrophy. There is degenerative disc disease at L5-S1. IMPRESSION: 1. Acute uncomplicated diverticulitis at the junction of the descending and sigmoid colon. No perforation or abscess. 2. Moderately large hiatal hernia. 3. Hepatomegaly. Aortic Atherosclerosis (ICD10-I70.0). Electronically Signed   By: Narda Rutherford M.D.   On: 09/15/2019 22:14        Scheduled Meds: . amLODipine  5 mg Oral Daily  . enoxaparin (LOVENOX) injection  60 mg Subcutaneous Q24H  . insulin aspart  0-9 Units Subcutaneous Q4H  . metoprolol succinate  50 mg Oral Daily   Continuous Infusions: . sodium chloride 125 mL/hr at 09/16/19 1926  . piperacillin-tazobactam (ZOSYN)  IV 3.375 g (09/17/19 0602)     LOS: 1 day    Time spent: 35 MIN    Burke Keels, MD [facility]  If 7PM-7AM, please contact night-coverage  09/17/2019, 11:30 AM

## 2019-09-18 DIAGNOSIS — I471 Supraventricular tachycardia: Secondary | ICD-10-CM

## 2019-09-18 DIAGNOSIS — A09 Infectious gastroenteritis and colitis, unspecified: Secondary | ICD-10-CM

## 2019-09-18 LAB — GLUCOSE, CAPILLARY
Glucose-Capillary: 101 mg/dL — ABNORMAL HIGH (ref 70–99)
Glucose-Capillary: 110 mg/dL — ABNORMAL HIGH (ref 70–99)
Glucose-Capillary: 127 mg/dL — ABNORMAL HIGH (ref 70–99)

## 2019-09-18 LAB — BASIC METABOLIC PANEL
Anion gap: 10 (ref 5–15)
BUN: <5 mg/dL — ABNORMAL LOW (ref 8–23)
CO2: 27 mmol/L (ref 22–32)
Calcium: 9 mg/dL (ref 8.9–10.3)
Chloride: 106 mmol/L (ref 98–111)
Creatinine, Ser: 0.79 mg/dL (ref 0.44–1.00)
GFR calc Af Amer: >60 mL/min (ref >60)
GFR calc non Af Amer: >60 mL/min (ref >60)
Glucose, Bld: 126 mg/dL — ABNORMAL HIGH (ref 70–99)
Potassium: 4.2 mmol/L (ref 3.5–5.1)
Sodium: 143 mmol/L (ref 135–145)

## 2019-09-18 LAB — CBC WITH DIFFERENTIAL/PLATELET
Abs Immature Granulocytes: 0.02 10*3/uL (ref 0.00–0.07)
Basophils Absolute: 0.1 10*3/uL (ref 0.0–0.1)
Basophils Relative: 1 %
Eosinophils Absolute: 0.5 10*3/uL (ref 0.0–0.5)
Eosinophils Relative: 6 %
HCT: 38.8 % (ref 36.0–46.0)
Hemoglobin: 12.3 g/dL (ref 12.0–15.0)
Immature Granulocytes: 0 %
Lymphocytes Relative: 35 %
Lymphs Abs: 2.7 10*3/uL (ref 0.7–4.0)
MCH: 25.6 pg — ABNORMAL LOW (ref 26.0–34.0)
MCHC: 31.7 g/dL (ref 30.0–36.0)
MCV: 80.7 fL (ref 80.0–100.0)
Monocytes Absolute: 0.7 10*3/uL (ref 0.1–1.0)
Monocytes Relative: 9 %
Neutro Abs: 3.7 10*3/uL (ref 1.7–7.7)
Neutrophils Relative %: 49 %
Platelets: 287 10*3/uL (ref 150–400)
RBC: 4.81 MIL/uL (ref 3.87–5.11)
RDW: 16 % — ABNORMAL HIGH (ref 11.5–15.5)
WBC: 7.5 10*3/uL (ref 4.0–10.5)
nRBC: 0 % (ref 0.0–0.2)

## 2019-09-18 NOTE — Progress Notes (Addendum)
PROGRESS NOTE    Yolanda Brown  VOZ:[id number]  DOB: June 13, 1960  PCP: Marva Panda, NP Admit date:09/15/2019 Chief compliant: persistent left sided abdominal pain 59 y.o.femalewith medical history significant ofBarrett esophagus, multiple episodes of diverticulitis previously, HTN, DM2.Pt diagnosed with diverticulitis in early June while being worked up for bariatric surgery.  Shortly after being diagnosed on imaging, she developed her typical diverticulitis symptoms of LLQ pain, diarrhea.  However despite attempted outpt treatment of 3 courses of ABx including levaquin, flagyl, and cipro. Pt has had ongoing LLQ abd pain and diarrhea. ED Course: Afebrile,WBC nl, no SIRS. CT reveals acute uncomplicated diverticulitis of junction of descending and sigmoid colon. Pt started on zosyn and transferred to [facility]. Hospital course: Patient admitted to [facility] for further evaluation and management.   Subjective:  Patient complaining of abdominal pain after eating today.  Husband at bedside and concerned about recurrent admissions.  Objective: Vitals:   09/17/19 0352 09/17/19 1357 09/17/19 2007 09/18/19 0511  BP: 130/65 (!) 140/94 131/82 (!) 144/85  Pulse: 85 69 76 73  Resp: 20 20 20 20   Temp: 98.4 F (36.9 C) 97.6 F (36.4 C) 97.8 F (36.6 C) 97.8 F (36.6 C)  TempSrc: Oral  Oral Oral  SpO2: 94% 93% 94% 98%  Weight:      Height:        Intake/Output Summary (Last 24 hours) at 09/18/2019 1046 Last data filed at 09/17/2019 1852 Gross per 24 hour  Intake 2128.1 ml  Output --  Net 2128.1 ml   Filed Weights   09/15/19 1658 09/16/19 0200  Weight: 122.9 kg 123.6 kg    Physical Examination: General: Moderately built, no acute distress noted Head ENT: Atraumatic normocephalic, PERRLA, neck supple Heart: S1-S2 heard, regular rate and rhythm, no murmurs.  No leg edema noted Lungs: Equal air entry bilaterally, no rhonchi or rales on exam, no accessory muscle use Abdomen: Obese, bowel  sounds heard, soft, tender in left lower quadrant, no rebound or guarding, nondistended. No organomegaly.  No CVA tenderness Extremities: No pedal edema.  No cyanosis or clubbing. Neurological: Awake alert oriented x3, no focal weakness or numbness, strength and sensations to crude touch intact Skin: No wounds or rashes.    Data Reviewed: I have personally reviewed following labs and imaging studies  CBC: Recent Labs  Lab 09/15/19 1823 09/16/19 0354 09/17/19 0815 09/18/19 0428  WBC 10.3 8.0 7.1 7.5  NEUTROABS  --   --  3.1 3.7  HGB 13.5 12.5 12.5 12.3  HCT 42.1 39.4 39.5 38.8  MCV 80.7 80.7 81.1 80.7  PLT 327 309 301 287   Basic Metabolic Panel: Recent Labs  Lab 09/15/19 1823 09/16/19 0354 09/17/19 0815 09/18/19 0428  NA 135 135 141 143  K 3.8 3.9 3.9 4.2  CL 98 101 103 106  CO2 25 25 27 27   GLUCOSE 135* 129* 117* 126*  BUN 10 9 7* <5*  CREATININE 0.83 0.83 0.87 0.79  CALCIUM 9.5 9.2 9.0 9.0   GFR: Estimated Creatinine Clearance: 99.1 mL/min (by C-G formula based on SCr of 0.79 mg/dL). Liver Function Tests: Recent Labs  Lab 09/15/19 1823  AST 41  ALT 31  ALKPHOS 60  BILITOT 0.2*  PROT 8.9*  ALBUMIN 3.8   Recent Labs  Lab 09/15/19 1823  LIPASE 28   No results for input(s): AMMONIA in the last 168 hours. Coagulation Profile: No results for input(s): INR, PROTIME in the last 168 hours. Cardiac Enzymes: No results for input(s): CKTOTAL, CKMB,  CKMBINDEX, TROPONINI in the last 168 hours. BNP (last 3 results) No results for input(s): PROBNP in the last 8760 hours. HbA1C: Recent Labs    09/16/19 0354  HGBA1C 7.3*   CBG: Recent Labs  Lab 09/17/19 0740 09/17/19 1204 09/17/19 1558 09/17/19 2011 09/17/19 2357  GLUCAP 116* 149* 120* 125* 129*   Lipid Profile: No results for input(s): CHOL, HDL, LDLCALC, TRIG, CHOLHDL, LDLDIRECT in the last 72 hours. Thyroid Function Tests: No results for input(s): TSH, T4TOTAL, FREET4, T3FREE, THYROIDAB in the  last 72 hours. Anemia Panel: No results for input(s): VITAMINB12, FOLATE, FERRITIN, TIBC, IRON, RETICCTPCT in the last 72 hours. Sepsis Labs: No results for input(s): PROCALCITON, LATICACIDVEN in the last 168 hours.  Recent Results (from the past 240 hour(s))  Urine culture     Status: None   Collection Time: 09/15/19  5:14 PM   Specimen: Urine, Random  Result Value Ref Range Status   Specimen Description   Final    URINE, RANDOM Performed at [facility], [address] [place], [place] [address]    Special Requests   Final    NONE Performed at [facility], [address] [place], [place] [address]    Culture   Final    NO GROWTH Performed at [facility], [address] [place], [place] [address]    Report Status 09/17/2019 FINAL  Final  SARS Coronavirus 2 by RT PCR (hospital order, performed in [facility] hospital lab) Nasopharyngeal Nasopharyngeal Swab     Status: None   Collection Time: 09/15/19 10:53 PM   Specimen: Nasopharyngeal Swab  Result Value Ref Range Status   SARS Coronavirus 2 NEGATIVE NEGATIVE Final    Comment: (NOTE) SARS-CoV-2 target nucleic acids are NOT DETECTED.  The SARS-CoV-2 RNA is generally detectable in upper and lower respiratory specimens during the acute phase of infection. The lowest concentration of SARS-CoV-2 viral copies this assay can detect is 250 copies / mL. A negative result does not preclude SARS-CoV-2 infection and should not be used as the sole basis for treatment or other patient management decisions.  A negative result may occur with improper specimen collection / handling, submission of specimen other than nasopharyngeal swab, presence of viral mutation(s) within the areas targeted by this assay, and inadequate number of viral copies (<250 copies / mL). A negative result must be combined with clinical observations, patient history, and epidemiological information.  Fact Sheet for Patients:     BoilerBrush.com.cy  Fact Sheet for Healthcare Providers: https://pope.com/  This test is not yet approved or  cleared by the [place] FDA and has been authorized for detection and/or diagnosis of SARS-CoV-2 by FDA under an Emergency Use Authorization (EUA).  This EUA will remain in effect (meaning this test can be used) for the duration of the COVID-19 declaration under Section 564(b)(1) of the Act, 21 U.S.C. section 360bbb-3(b)(1), unless the authorization is terminated or revoked sooner.  Performed at [facility], [address] [place], [place] [address]       Radiology Studies: No results found.    Scheduled Meds: . amLODipine  5 mg Oral Daily  . enoxaparin (LOVENOX) injection  60 mg Subcutaneous Q24H  . insulin aspart  0-9 Units Subcutaneous Q4H  . metoprolol succinate  50 mg Oral Daily   Continuous Infusions: . sodium chloride 100 mL/hr at 09/18/19 0453  . piperacillin-tazobactam (ZOSYN)  IV 3.375 g (09/18/19 0453)  Assessment/Plan:  1.  Acute uncomplicated diverticulitis: Admitted with IV antibiotics (Zosyn) as failed multiple rounds of outpatient antibiotic therapy.  On clear liquid diet, advance slowly as tolerated as patient does report 9/10 pain after eating and relieved only to 5/10 with 2 mg morphine.  Remains on IV hydration.  Will possibly need interval colonoscopy given recurrent episodes.Follows [facility] for bariatric surgery -As part of preoperative work-up she was seen by GI, Dr. Williams Che and underwent EGD in June revealing Barrett's esophagus.  Never had a colonoscopy.  2.  Hypertension: Diuretics (HCTZ/triamterene) on hold.  Resumed other medications.  3.  Diabetes mellitus type 2: Continue sliding scale insulin.  Change Accu-Cheks to before meals nightly.  Holding Metformin given acute infection.  4.  Morbid obesity: Patient states she follows [facility] for bariatric surgery  Dr. Smitty Cords.    5.  History of PSVT: Isolated event in the past.  Had symptoms, palpitations at that time.  Advised patient to alert medical team if has recurrent palpitations.  DVT prophylaxis: Lovenox Code Status: Full code Family / Patient Communication: Discussed with patient and husband at bedside.  All questions answered to satisfaction.  Patient worried about going home prematurely due to multiple relapses. Disposition Plan: Status is: Inpatient  Remains inpatient appropriate because:IV treatments appropriate due to intensity of illness or inability to take PO   Dispo: The patient is from: Home              Anticipated d/c is to: Home              Anticipated d/c date is: 3 days              Patient currently is not medically stable to d/c.            Time spent: 35 minutes  >50% time spent in discussions with care team and coordination of care.    Alessandra Bevels, MD [facility] Pager in [place]  If 7PM-7AM, please contact night-coverage [email or web address] 09/18/2019, 10:46 AM

## 2019-09-19 DIAGNOSIS — Z8679 Personal history of other diseases of the circulatory system: Secondary | ICD-10-CM

## 2019-09-19 LAB — GLUCOSE, CAPILLARY
Glucose-Capillary: 111 mg/dL — ABNORMAL HIGH (ref 70–99)
Glucose-Capillary: 101 mg/dL — ABNORMAL HIGH (ref 70–99)
Glucose-Capillary: 108 mg/dL — ABNORMAL HIGH (ref 70–99)
Glucose-Capillary: 119 mg/dL — ABNORMAL HIGH (ref 70–99)

## 2019-09-19 NOTE — Progress Notes (Addendum)
PROGRESS NOTE    Yolanda Brown  NWG:[id number]  DOB: 07-13-1960  PCP: Marva Panda, NP Admit date:09/15/2019 Chief compliant: persistent left sided abdominal pain 59 y.o.femalewith medical history significant ofBarrett esophagus, multiple episodes of diverticulitis previously, HTN, DM2.Pt diagnosed with diverticulitis in early June while being worked up for bariatric surgery.  Shortly after being diagnosed on imaging, she developed her typical diverticulitis symptoms of LLQ pain, diarrhea.  However despite attempted outpt treatment of 3 courses of ABx including levaquin, flagyl, and cipro. Pt has had ongoing LLQ abd pain and diarrhea. ED Course: Afebrile,WBC nl, no SIRS. CT reveals acute uncomplicated diverticulitis of junction of descending and sigmoid colon. Pt started on zosyn and transferred to [facility]. Hospital course: Patient admitted to [facility] for further evaluation and management.   Subjective:  Patient denies any acute complaints.  Able to ambulate in the room and reports significant improvement in abdominal discomfort after she downgraded to clear liquid diet.  Wants to try full liquids today.  Objective: Vitals:   09/19/19 0601 09/19/19 0940 09/19/19 1357 09/19/19 1415  BP: 130/90 134/78 (!) 153/103 (!) 137/93  Pulse: 67 88 75 73  Resp: 19  20 16   Temp: 98.1 F (36.7 C)  97.9 F (36.6 C) 98 F (36.7 C)  TempSrc:   Oral Oral  SpO2: 94%  93% 94%  Weight:      Height:       No intake or output data in the 24 hours ending 09/19/19 1457 Filed Weights   09/15/19 1658 09/16/19 0200  Weight: 122.9 kg 123.6 kg    Physical Examination: General: Moderately built, no acute distress noted Head ENT: Atraumatic normocephalic, PERRLA, neck supple Heart: S1-S2 heard, regular rate and rhythm, no murmurs.  No leg edema noted Lungs: Equal air entry bilaterally, no rhonchi or rales on exam, no accessory muscle use Abdomen: Obese, bowel sounds heard, soft, tender in left lower  quadrant, no rebound or guarding, nondistended. No organomegaly.  No CVA tenderness Extremities: No pedal edema.  No cyanosis or clubbing. Neurological: Awake alert oriented x3, no focal weakness or numbness, strength and sensations to crude touch intact Skin: No wounds or rashes.    Data Reviewed: I have personally reviewed following labs and imaging studies  CBC: Recent Labs  Lab 09/15/19 1823 09/16/19 0354 09/17/19 0815 09/18/19 0428  WBC 10.3 8.0 7.1 7.5  NEUTROABS  --   --  3.1 3.7  HGB 13.5 12.5 12.5 12.3  HCT 42.1 39.4 39.5 38.8  MCV 80.7 80.7 81.1 80.7  PLT 327 309 301 287   Basic Metabolic Panel: Recent Labs  Lab 09/15/19 1823 09/16/19 0354 09/17/19 0815 09/18/19 0428  NA 135 135 141 143  K 3.8 3.9 3.9 4.2  CL 98 101 103 106  CO2 25 25 27 27   GLUCOSE 135* 129* 117* 126*  BUN 10 9 7* <5*  CREATININE 0.83 0.83 0.87 0.79  CALCIUM 9.5 9.2 9.0 9.0   GFR: Estimated Creatinine Clearance: 99.1 mL/min (by C-G formula based on SCr of 0.79 mg/dL). Liver Function Tests: Recent Labs  Lab 09/15/19 1823  AST 41  ALT 31  ALKPHOS 60  BILITOT 0.2*  PROT 8.9*  ALBUMIN 3.8   Recent Labs  Lab 09/15/19 1823  LIPASE 28   No results for input(s): AMMONIA in the last 168 hours. Coagulation Profile: No results for input(s): INR, PROTIME in the last 168 hours. Cardiac Enzymes: No results for input(s): CKTOTAL, CKMB, CKMBINDEX, TROPONINI in the last 168 hours.  BNP (last 3 results) No results for input(s): PROBNP in the last 8760 hours. HbA1C: No results for input(s): HGBA1C in the last 72 hours. CBG: Recent Labs  Lab 09/18/19 1139 09/18/19 1629 09/18/19 2128 09/19/19 0738 09/19/19 1129  GLUCAP 101* 110* 127* 111* 101*   Lipid Profile: No results for input(s): CHOL, HDL, LDLCALC, TRIG, CHOLHDL, LDLDIRECT in the last 72 hours. Thyroid Function Tests: No results for input(s): TSH, T4TOTAL, FREET4, T3FREE, THYROIDAB in the last 72 hours. Anemia Panel: No  results for input(s): VITAMINB12, FOLATE, FERRITIN, TIBC, IRON, RETICCTPCT in the last 72 hours. Sepsis Labs: No results for input(s): PROCALCITON, LATICACIDVEN in the last 168 hours.  Recent Results (from the past 240 hour(s))  Urine culture     Status: None   Collection Time: 09/15/19  5:14 PM   Specimen: Urine, Random  Result Value Ref Range Status   Specimen Description   Final    URINE, RANDOM Performed at [facility], [address] [place], [place] [address]    Special Requests   Final    NONE Performed at [facility], [address] [place], [place] [address]    Culture   Final    NO GROWTH Performed at [facility], [address] [place], [place] [address]    Report Status 09/17/2019 FINAL  Final  SARS Coronavirus 2 by RT PCR (hospital order, performed in [facility] hospital lab) Nasopharyngeal Nasopharyngeal Swab     Status: None   Collection Time: 09/15/19 10:53 PM   Specimen: Nasopharyngeal Swab  Result Value Ref Range Status   SARS Coronavirus 2 NEGATIVE NEGATIVE Final    Comment: (NOTE) SARS-CoV-2 target nucleic acids are NOT DETECTED.  The SARS-CoV-2 RNA is generally detectable in upper and lower respiratory specimens during the acute phase of infection. The lowest concentration of SARS-CoV-2 viral copies this assay can detect is 250 copies / mL. A negative result does not preclude SARS-CoV-2 infection and should not be used as the sole basis for treatment or other patient management decisions.  A negative result may occur with improper specimen collection / handling, submission of specimen other than nasopharyngeal swab, presence of viral mutation(s) within the areas targeted by this assay, and inadequate number of viral copies (<250 copies / mL). A negative result must be combined with clinical observations, patient history, and epidemiological information.  Fact Sheet for Patients:     BoilerBrush.com.cy  Fact Sheet for Healthcare Providers: https://pope.com/  This test is not yet approved or  cleared by the [place] FDA and has been authorized for detection and/or diagnosis of SARS-CoV-2 by FDA under an Emergency Use Authorization (EUA).  This EUA will remain in effect (meaning this test can be used) for the duration of the COVID-19 declaration under Section 564(b)(1) of the Act, 21 U.S.C. section 360bbb-3(b)(1), unless the authorization is terminated or revoked sooner.  Performed at [facility], [address] [place], [place] [address]       Radiology Studies: No results found.    Scheduled Meds: . amLODipine  5 mg Oral Daily  . enoxaparin (LOVENOX) injection  60 mg Subcutaneous Q24H  . insulin aspart  0-9 Units Subcutaneous Q4H  . metoprolol succinate  50 mg Oral Daily   Continuous Infusions: . sodium chloride 100 mL/hr at 09/18/19 2150  . piperacillin-tazobactam (ZOSYN)  IV 3.375 g (09/19/19 1218)     Assessment/Plan:  1.  Acute uncomplicated diverticulitis: Admitted with  IV antibiotics (Zosyn) as failed multiple rounds of outpatient antibiotic therapy.  On clear liquid diet, advance slowly as tolerated (full liquid diet today) as patient reports improvement.  Remains on IV hydration-Will DC in a.m.  Will possibly need interval colonoscopy given recurrent episodes.Follows [facility] for bariatric surgery -As part of preoperative work-up she was seen by GI, Dr. Williams Che and underwent EGD in June revealing Barrett's esophagus.  Never had a colonoscopy.  2.  Hypertension: Diuretics (HCTZ/triamterene) on hold.  Resumed other medications.  3.  Diabetes mellitus type 2/borderline/impaired fasting glucose: Continue sliding scale insulin.  Change Accu-Cheks to before meals nightly.  Holding Metformin given acute infection.  4.  Morbid obesity: Patient states she follows [facility] for  bariatric surgery Dr. Smitty Cords.    5.  History of PSVT: Isolated event in the past.  Had symptoms, palpitations at that time.  Advised patient to alert medical team if has recurrent palpitations.  DVT prophylaxis: Lovenox Code Status: Full code Family / Patient Communication: Discussed with patient and husband at bedside.  All questions answered to satisfaction.  Patient worried about going home prematurely due to multiple relapses. Disposition Plan: Status is: Inpatient  Remains inpatient appropriate because:IV treatments appropriate due to intensity of illness or inability to take PO   Dispo: The patient is from: Home              Anticipated d/c is to: Home              Anticipated d/c date is: 2 days              Patient currently is not medically stable to d/c.            Time spent: 15 minutes  >50% time spent in discussions with care team and coordination of care.    Yolanda Bevels, MD [facility] Pager in [place]  If 7PM-7AM, please contact night-coverage [email or web address] 09/19/2019, 2:57 PM

## 2019-09-19 NOTE — Progress Notes (Signed)
Pharmacy Antibiotic Note  Yolanda Brown is a 59 y.o. female admitted on 09/15/2019 with intra-abdominal infection.  Pharmacy has been consulted for Zosyn dosing.  09/19/2019  Scr 0.79, CrCl ~ 99.37mls/min WBC 7.5 afebrile  Plan: Continue Zosyn 3.375g IV Q8H infused over 4hrs. Pharmacy will sign off and follow peripherally  Height: 5\' 6"  (167.6 cm) Weight: 123.6 kg (272 lb 7.8 oz) IBW/kg (Calculated) : 59.3  Temp (24hrs), Avg:98 F (36.7 C), Min:97.9 F (36.6 C), Max:98.1 F (36.7 C)  Recent Labs  Lab 09/15/19 1823 09/16/19 0354 09/17/19 0815 09/18/19 0428  WBC 10.3 8.0 7.1 7.5  CREATININE 0.83 0.83 0.87 0.79    Estimated Creatinine Clearance: 99.1 mL/min (by C-G formula based on SCr of 0.79 mg/dL).    Allergies  Allergen Reactions  . Aspirin Hives    Antimicrobials this admission: 7/9 Zosyn >>    Dose adjustments this admission:   Microbiology results: 7/9 Covid: negative 7/9 UCx: NGF  Thank you for allowing pharmacy to be a part of this patient's care.  9/9 RPh 09/19/2019, 8:03 AM

## 2019-09-20 LAB — GLUCOSE, CAPILLARY
Glucose-Capillary: 113 mg/dL — ABNORMAL HIGH (ref 70–99)
Glucose-Capillary: 109 mg/dL — ABNORMAL HIGH (ref 70–99)
Glucose-Capillary: 110 mg/dL — ABNORMAL HIGH (ref 70–99)
Glucose-Capillary: 128 mg/dL — ABNORMAL HIGH (ref 70–99)

## 2019-09-20 LAB — CBC
HCT: 41.4 % (ref 36.0–46.0)
Hemoglobin: 12.8 g/dL (ref 12.0–15.0)
MCH: 25.2 pg — ABNORMAL LOW (ref 26.0–34.0)
MCHC: 30.9 g/dL (ref 30.0–36.0)
MCV: 81.5 fL (ref 80.0–100.0)
Platelets: 327 10*3/uL (ref 150–400)
RBC: 5.08 MIL/uL (ref 3.87–5.11)
RDW: 16.8 % — ABNORMAL HIGH (ref 11.5–15.5)
WBC: 8.2 10*3/uL (ref 4.0–10.5)
nRBC: 0 % (ref 0.0–0.2)

## 2019-09-20 LAB — BASIC METABOLIC PANEL
Anion gap: 12 (ref 5–15)
BUN: <5 mg/dL — ABNORMAL LOW (ref 8–23)
CO2: 23 mmol/L (ref 22–32)
Calcium: 9 mg/dL (ref 8.9–10.3)
Chloride: 106 mmol/L (ref 98–111)
Creatinine, Ser: 0.86 mg/dL (ref 0.44–1.00)
GFR calc Af Amer: >60 mL/min (ref >60)
GFR calc non Af Amer: >60 mL/min (ref >60)
Glucose, Bld: 120 mg/dL — ABNORMAL HIGH (ref 70–99)
Potassium: 3.5 mmol/L (ref 3.5–5.1)
Sodium: 141 mmol/L (ref 135–145)

## 2019-09-20 MED ORDER — INSULIN ASPART 100 UNIT/ML ~~LOC~~ SOLN: 0.0000 [IU] | Freq: Three times a day (TID) | SUBCUTANEOUS | Status: DC

## 2019-09-20 NOTE — Progress Notes (Signed)
PROGRESS NOTE    Yolanda Brown  RUE:[id number]  DOB: 1960/11/03  PCP: Marva Panda, NP Admit date:09/15/2019 Chief compliant: persistent left sided abdominal pain 59 y.o.femalewith medical history significant ofBarrett esophagus, multiple episodes of diverticulitis previously, HTN, DM2.Pt diagnosed with diverticulitis in early June while being worked up for bariatric surgery.  Shortly after being diagnosed on imaging, she developed her typical diverticulitis symptoms of LLQ pain, diarrhea.  However despite attempted outpt treatment of 3 courses of ABx including levaquin, flagyl, and cipro. Pt has had ongoing LLQ abd pain and diarrhea. ED Course: Afebrile,WBC nl, no SIRS. CT reveals acute uncomplicated diverticulitis of junction of descending and sigmoid colon. Pt started on zosyn and transferred to [facility]. Hospital course: Patient admitted to [facility] for further evaluation and management, started on IV antibiotics.    Subjective:  Patient tolerating Full liquid diet okay. Afebrile. SBP somewhat elevated at 150s  Objective: Vitals:   09/19/19 1357 09/19/19 1415 09/19/19 2100 09/20/19 0559  BP: (!) 153/103 (!) 137/93 (!) 150/96 (!) 151/91  Pulse: 75 73 71 82  Resp: 20 16 19 19   Temp: 97.9 F (36.6 C) 98 F (36.7 C) 97.9 F (36.6 C) 97.9 F (36.6 C)  TempSrc: Oral Oral    SpO2: 93% 94% 95% 96%  Weight:      Height:        Intake/Output Summary (Last 24 hours) at 09/20/2019 0834 Last data filed at 09/20/2019 0600 Gross per 24 hour  Intake 1508.14 ml  Output --  Net 1508.14 ml   Filed Weights   09/15/19 1658 09/16/19 0200  Weight: 122.9 kg 123.6 kg    Physical Examination: General: Moderately built, no acute distress noted Head ENT: Atraumatic normocephalic, PERRLA, neck supple Heart: S1-S2 heard, regular rate and rhythm, no murmurs.  No leg edema noted Lungs: Equal air entry bilaterally, no rhonchi or rales on exam, no accessory muscle use Abdomen: Obese, bowel sounds  heard, soft, improving tenderness in left lower quadrant Extremities: No pedal edema.  No cyanosis or clubbing. Neurological: Awake alert oriented x3, no focal weakness or numbness, strength and sensations to crude touch intact Skin: No wounds or rashes.    Data Reviewed: I have personally reviewed following labs and imaging studies  CBC: Recent Labs  Lab 09/15/19 1823 09/16/19 0354 09/17/19 0815 09/18/19 0428 09/20/19 0417  WBC 10.3 8.0 7.1 7.5 8.2  NEUTROABS  --   --  3.1 3.7  --   HGB 13.5 12.5 12.5 12.3 12.8  HCT 42.1 39.4 39.5 38.8 41.4  MCV 80.7 80.7 81.1 80.7 81.5  PLT 327 309 301 287 327   Basic Metabolic Panel: Recent Labs  Lab 09/15/19 1823 09/16/19 0354 09/17/19 0815 09/18/19 0428 09/20/19 0417  NA 135 135 141 143 141  K 3.8 3.9 3.9 4.2 3.5  CL 98 101 103 106 106  CO2 25 25 27 27 23   GLUCOSE 135* 129* 117* 126* 120*  BUN 10 9 7* <5* <5*  CREATININE 0.83 0.83 0.87 0.79 0.86  CALCIUM 9.5 9.2 9.0 9.0 9.0   GFR: Estimated Creatinine Clearance: 92.2 mL/min (by C-G formula based on SCr of 0.86 mg/dL). Liver Function Tests: Recent Labs  Lab 09/15/19 1823  AST 41  ALT 31  ALKPHOS 60  BILITOT 0.2*  PROT 8.9*  ALBUMIN 3.8   Recent Labs  Lab 09/15/19 1823  LIPASE 28   No results for input(s): AMMONIA in the last 168 hours. Coagulation Profile: No results for input(s): INR, PROTIME in  the last 168 hours. Cardiac Enzymes: No results for input(s): CKTOTAL, CKMB, CKMBINDEX, TROPONINI in the last 168 hours. BNP (last 3 results) No results for input(s): PROBNP in the last 8760 hours. HbA1C: No results for input(s): HGBA1C in the last 72 hours. CBG: Recent Labs  Lab 09/19/19 0738 09/19/19 1129 09/19/19 1653 09/19/19 2104 09/20/19 0750  GLUCAP 111* 101* 108* 119* 113*   Lipid Profile: No results for input(s): CHOL, HDL, LDLCALC, TRIG, CHOLHDL, LDLDIRECT in the last 72 hours. Thyroid Function Tests: No results for input(s): TSH, T4TOTAL, FREET4,  T3FREE, THYROIDAB in the last 72 hours. Anemia Panel: No results for input(s): VITAMINB12, FOLATE, FERRITIN, TIBC, IRON, RETICCTPCT in the last 72 hours. Sepsis Labs: No results for input(s): PROCALCITON, LATICACIDVEN in the last 168 hours.  Recent Results (from the past 240 hour(s))  Urine culture     Status: None   Collection Time: 09/15/19  5:14 PM   Specimen: Urine, Random  Result Value Ref Range Status   Specimen Description   Final    URINE, RANDOM Performed at [facility], [address] [place], [place] [address]    Special Requests   Final    NONE Performed at [facility], [address] [place], [place] [address]    Culture   Final    NO GROWTH Performed at [facility], [address], [place], [place] [address]    Report Status 09/17/2019 FINAL  Final  SARS Coronavirus 2 by RT PCR (hospital order, performed in [facility] hospital lab) Nasopharyngeal Nasopharyngeal Swab     Status: None   Collection Time: 09/15/19 10:53 PM   Specimen: Nasopharyngeal Swab  Result Value Ref Range Status   SARS Coronavirus 2 NEGATIVE NEGATIVE Final    Comment: (NOTE) SARS-CoV-2 target nucleic acids are NOT DETECTED.  The SARS-CoV-2 RNA is generally detectable in upper and lower respiratory specimens during the acute phase of infection. The lowest concentration of SARS-CoV-2 viral copies this assay can detect is 250 copies / mL. A negative result does not preclude SARS-CoV-2 infection and should not be used as the sole basis for treatment or other patient management decisions.  A negative result may occur with improper specimen collection / handling, submission of specimen other than nasopharyngeal swab, presence of viral mutation(s) within the areas targeted by this assay, and inadequate number of viral copies (<250 copies / mL). A negative result must be combined with clinical observations, patient history, and epidemiological information.  Fact  Sheet for Patients:   BoilerBrush.com.cy  Fact Sheet for Healthcare Providers: https://pope.com/  This test is not yet approved or  cleared by the [place] FDA and has been authorized for detection and/or diagnosis of SARS-CoV-2 by FDA under an Emergency Use Authorization (EUA).  This EUA will remain in effect (meaning this test can be used) for the duration of the COVID-19 declaration under Section 564(b)(1) of the Act, 21 U.S.C. section 360bbb-3(b)(1), unless the authorization is terminated or revoked sooner.  Performed at [facility], [address] [place], [place] [address]       Radiology Studies: No results found.    Scheduled Meds: . amLODipine  5 mg Oral Daily  . enoxaparin (LOVENOX) injection  60 mg Subcutaneous Q24H  . insulin aspart  0-9 Units Subcutaneous Q4H  . metoprolol succinate  50 mg Oral Daily   Continuous Infusions: . sodium chloride 100 mL/hr at 09/19/19 1848  . piperacillin-tazobactam (ZOSYN)  IV 3.375 g (09/20/19 0432)     Assessment/Plan:  1.  Acute uncomplicated diverticulitis: Admitted with IV antibiotics (Zosyn) as failed multiple rounds of outpatient antibiotic therapy.  Plan to complete 5 days course as inpatient. On Full liquid diet, advanced slowly as tolerated-will try regular diet today as patient reports improvement.  Discontinue IV hydration.  Will possibly need interval colonoscopy given recurrent episodes.Follows [facility] for bariatric surgery -As part of preoperative work-up she was seen by GI, Dr. Williams Che and underwent EGD in June revealing Barrett's esophagus.  Never had a colonoscopy.  2.  Hypertension: Diuretics (HCTZ/triamterene) on hold.  Resumed other medications.  3.  Diabetes mellitus type 2/borderline/impaired fasting glucose: Continue sliding scale insulin.  Change Accu-Cheks to before meals nightly.  Holding Metformin given acute infection.  4.  Morbid  obesity: Patient states she follows [facility] for bariatric surgery Dr. Smitty Cords.    5.  History of PSVT: Isolated event in the past.  Had symptoms, palpitations at that time.  Advised patient to alert medical team if has recurrent palpitations.  DVT prophylaxis: Lovenox Code Status: Full code Family / Patient Communication: Discussed with patient and husband at bedside.  All questions answered to satisfaction.  Patient worried about going home prematurely due to multiple relapses. Disposition Plan: Status is: Inpatient  Remains inpatient appropriate because:IV treatments appropriate due to intensity of illness or inability to take PO   Dispo: The patient is from: Home              Anticipated d/c is to: Home              Anticipated d/c date is: 1 days              Patient currently is not medically stable to d/c.            Time spent: 15 minutes  >50% time spent in discussions with care team and coordination of care.    Alessandra Bevels, MD [facility] Pager in [place]  If 7PM-7AM, please contact night-coverage [email or web address] 09/20/2019, 8:34 AM

## 2019-09-21 DIAGNOSIS — E785 Hyperlipidemia, unspecified: Secondary | ICD-10-CM

## 2019-09-21 LAB — GLUCOSE, CAPILLARY
Glucose-Capillary: 114 mg/dL — ABNORMAL HIGH (ref 70–99)
Glucose-Capillary: 100 mg/dL — ABNORMAL HIGH (ref 70–99)

## 2019-09-21 MED ORDER — ONDANSETRON HCL 4 MG PO TABS: 4.0000 mg | ORAL_TABLET | Freq: Four times a day (QID) | ORAL | 0 refills | Status: AC | PRN

## 2019-09-21 MED ORDER — AMOXICILLIN-POT CLAVULANATE 875-125 MG PO TABS: 1.0000 | ORAL_TABLET | Freq: Two times a day (BID) | ORAL | 0 refills | Status: AC

## 2019-09-21 MED ORDER — PIPERACILLIN-TAZOBACTAM 3.375 G IVPB 30 MIN
3.3750 g | Freq: Four times a day (QID) | INTRAVENOUS | Status: AC
  Administered 2019-09-21 (×2): 3.375 g via INTRAVENOUS
  Filled 2019-09-21 (×4): qty 50

## 2019-09-21 NOTE — Progress Notes (Signed)
Discharge Instruction Packet provided to patient and all questions answered, no other concerns from patient. IV removed. Patient transferred via wheelchair downstairs.

## 2019-09-21 NOTE — Discharge Summary (Addendum)
Physician Discharge Summary  Yolanda Brown OMV:[id number] DOB: 1960/12/11 DOA: 09/15/2019  PCP: Marva Panda, NP  Admit date: 09/15/2019 Discharge date: 09/21/2019 Consultations: None Admitted From: home Disposition: home  Discharge Diagnoses:  Principal Problem:   Acute diverticulitis Active Problems:   Essential hypertension   Impaired fasting blood sugar   Diarrhea   Hospital Course Summary: 60 y.o.femalewith medical history significant ofBarrett esophagus, multiple episodes of diverticulitis previously, HTN, DM2.Pt diagnosed with diverticulitis in early June while being worked up for bariatric surgery. Shortly after being diagnosed on imaging, she developed her typical diverticulitis symptoms of LLQ pain, diarrhea. However despite attempted outpt treatment of 3 courses of ABx including levaquin, flagyl, and cipro. Pt has had ongoing LLQ abd pain and diarrhea. ED Course: Afebrile,WBC nl, no SIRS. CT reveals acute uncomplicated diverticulitis of junction of descending and sigmoid colon.Pt started on zosyn and transferred to [facility]. Hospital course: Patient admitted to [facility] for further evaluation and management, started on IV antibiotics.   1.  Acute uncomplicated diverticulitis: Admitted with IV antibiotics (Zosyn) as failed multiple rounds of outpatient antibiotic therapy. Completed 5 days course as inpatient and will prescribe Augmentin on discharge for 2 more days. . Diet was advanced very slowly as tolerated-now off IV hydration.  Will need interval colonoscopy given recurrent episodes.Follows [facility] for bariatric surgery -As part of preoperative work-up she was seen by GI, Dr. Williams Che and underwent EGD in June revealing Barrett's esophagus.  Never had a colonoscopy. Recommend follow up GI as scheduled   2.  Hypertension: Resumed Metoprolol. Diuretics (HCTZ/triamterene) held in this hospitalization given poor oral intake and GI losses in order to avoid dehydration.  May  resume at home as BP now going up.  3.  Diabetes mellitus type 2/borderline/impaired fasting glucose: Continue sliding scale insulin.  Change Accu-Cheks to before meals nightly.  Held Metformin while here given acute infection.Resume on discharge  4.  Morbid obesity: Patient states she follows [facility] for bariatric surgery Dr. Smitty Cords.    5.  History of PSVT: Isolated event in the past.  Had symptoms, palpitations at that time. None here. On Metoprolol   Discharge Exam:   Vitals:   09/20/19 1404 09/20/19 2017 09/21/19 0413 09/21/19 1335  BP: (!) 141/85 (!) 140/91 (!) 139/94 108/74  Pulse: 80 74 84 76  Resp: 18 19 19 16   Temp: 99 F (37.2 C) 98 F (36.7 C) 98.2 F (36.8 C) 98 F (36.7 C)  TempSrc: Oral   Oral  SpO2: 95% 97% 96% 97%  Weight:      Height:        General: Pt is alert, awake, not in acute distress Cardiovascular: RRR, S1/S2 +, no rubs, no gallops Respiratory: CTA bilaterally, no wheezing, no rhonchi Abdominal: Soft, NT, ND, bowel sounds + Extremities: no edema, no cyanosis  Discharge Condition:Stable CODE STATUS: Diet recommendation: Recommendations for Outpatient Follow-up:  1. Follow up with PCP:  2. Follow up with consultants:  3. Please obtain follow up labs including:   Home Health services upon discharge:  Equipment/Devices upon discharge:   Discharge Instructions:  Discharge Instructions    Call MD for:  extreme fatigue   Complete by: As directed    Call MD for:  persistant dizziness or light-headedness   Complete by: As directed    Call MD for:  persistant nausea and vomiting   Complete by: As directed    Call MD for:  redness, tenderness, or signs of infection (pain, swelling, redness, odor or  green/yellow discharge around incision site)   Complete by: As directed    Call MD for:  severe uncontrolled pain   Complete by: As directed    Call MD for:  temperature >100.4   Complete by: As directed    Diet - low sodium heart healthy    Complete by: As directed    Increase activity slowly   Complete by: As directed      Allergies as of 09/21/2019      Reactions   Aspirin Hives      Medication List    TAKE these medications   ALPRAZolam 0.5 MG tablet Commonly known as: XANAX take 1 TABLET BY MOUTH TWICE DAILY AS NEEDED for anxiety What changed:   reasons to take this  additional instructions   amLODipine 5 MG tablet Commonly known as: NORVASC TAKE 1 TABLET BY MOUTH EVERY DAY   amoxicillin-clavulanate 875-125 MG tablet Commonly known as: Augmentin Take 1 tablet by mouth 2 (two) times daily for 3 days.   metFORMIN 1000 MG tablet Commonly known as: GLUCOPHAGE Take 1,000 mg by mouth 2 (two) times daily with a meal.   metoprolol succinate 50 MG 24 hr tablet Commonly known as: TOPROL-XL Take 1 tablet (50 mg total) by mouth daily. MAY TAKE EXTRA TABLET FOR SVT EPISODE AS NEEDED. Please make overdue appt with Dr. Anne FuSkains. 1st attempt   multivitamin with minerals Tabs tablet Take 1 tablet by mouth every morning.   ondansetron 4 MG tablet Commonly known as: ZOFRAN Take 1 tablet (4 mg total) by mouth every 6 (six) hours as needed for nausea.   triamterene-hydrochlorothiazide 37.5-25 MG tablet Commonly known as: MAXZIDE-25 TAKE 1 TABLET BY MOUTH EVERY DAY   ZEGERID OTC PO Take 1 tablet by mouth daily.       Allergies  Allergen Reactions  . Aspirin Hives      The results of significant diagnostics from this hospitalization (including imaging, microbiology, ancillary and laboratory) are listed below for reference.    Labs: BNP (last 3 results) No results for input(s): BNP in the last 8760 hours. Basic Metabolic Panel: Recent Labs  Lab 09/15/19 1823 09/16/19 0354 09/17/19 0815 09/18/19 0428 09/20/19 0417  NA 135 135 141 143 141  K 3.8 3.9 3.9 4.2 3.5  CL 98 101 103 106 106  CO2 25 25 27 27 23   GLUCOSE 135* 129* 117* 126* 120*  BUN 10 9 7* <5* <5*  CREATININE 0.83 0.83 0.87 0.79 0.86   CALCIUM 9.5 9.2 9.0 9.0 9.0   Liver Function Tests: Recent Labs  Lab 09/15/19 1823  AST 41  ALT 31  ALKPHOS 60  BILITOT 0.2*  PROT 8.9*  ALBUMIN 3.8   Recent Labs  Lab 09/15/19 1823  LIPASE 28   No results for input(s): AMMONIA in the last 168 hours. CBC: Recent Labs  Lab 09/15/19 1823 09/16/19 0354 09/17/19 0815 09/18/19 0428 09/20/19 0417  WBC 10.3 8.0 7.1 7.5 8.2  NEUTROABS  --   --  3.1 3.7  --   HGB 13.5 12.5 12.5 12.3 12.8  HCT 42.1 39.4 39.5 38.8 41.4  MCV 80.7 80.7 81.1 80.7 81.5  PLT 327 309 301 287 327   Cardiac Enzymes: No results for input(s): CKTOTAL, CKMB, CKMBINDEX, TROPONINI in the last 168 hours. BNP: Invalid input(s): POCBNP CBG: Recent Labs  Lab 09/20/19 1152 09/20/19 1641 09/20/19 2021 09/21/19 0757 09/21/19 1111  GLUCAP 109* 110* 128* 114* 100*   D-Dimer No results for input(s): DDIMER in  the last 72 hours. Hgb A1c No results for input(s): HGBA1C in the last 72 hours. Lipid Profile No results for input(s): CHOL, HDL, LDLCALC, TRIG, CHOLHDL, LDLDIRECT in the last 72 hours. Thyroid function studies No results for input(s): TSH, T4TOTAL, T3FREE, THYROIDAB in the last 72 hours.  Invalid input(s): FREET3 Anemia work up No results for input(s): VITAMINB12, FOLATE, FERRITIN, TIBC, IRON, RETICCTPCT in the last 72 hours. Urinalysis    Component Value Date/Time   COLORURINE YELLOW 09/15/2019 1714   APPEARANCEUR CLEAR 09/15/2019 1714   LABSPEC 1.020 09/15/2019 1714   PHURINE 6.0 09/15/2019 1714   GLUCOSEU NEGATIVE 09/15/2019 1714   HGBUR TRACE (A) 09/15/2019 1714   BILIRUBINUR NEGATIVE 09/15/2019 1714   BILIRUBINUR n 08/06/2015 1242   KETONESUR NEGATIVE 09/15/2019 1714   PROTEINUR 100 (A) 09/15/2019 1714   UROBILINOGEN negative 08/06/2015 1242   UROBILINOGEN 0.2 05/05/2014 1955   NITRITE NEGATIVE 09/15/2019 1714   LEUKOCYTESUR SMALL (A) 09/15/2019 1714   Sepsis Labs Invalid input(s): PROCALCITONIN,  WBC,   LACTICIDVEN Microbiology Recent Results (from the past 240 hour(s))  Urine culture     Status: None   Collection Time: 09/15/19  5:14 PM   Specimen: Urine, Random  Result Value Ref Range Status   Specimen Description   Final    URINE, RANDOM Performed at [facility], [address] [place], [place] [address]    Special Requests   Final    NONE Performed at [facility], [address] [place], [place] [address]    Culture   Final    NO GROWTH Performed at [facility], [address] [place], [place] [address]    Report Status 09/17/2019 FINAL  Final  SARS Coronavirus 2 by RT PCR (hospital order, performed in [facility] hospital lab) Nasopharyngeal Nasopharyngeal Swab     Status: None   Collection Time: 09/15/19 10:53 PM   Specimen: Nasopharyngeal Swab  Result Value Ref Range Status   SARS Coronavirus 2 NEGATIVE NEGATIVE Final    Comment: (NOTE) SARS-CoV-2 target nucleic acids are NOT DETECTED.  The SARS-CoV-2 RNA is generally detectable in upper and lower respiratory specimens during the acute phase of infection. The lowest concentration of SARS-CoV-2 viral copies this assay can detect is 250 copies / mL. A negative result does not preclude SARS-CoV-2 infection and should not be used as the sole basis for treatment or other patient management decisions.  A negative result may occur with improper specimen collection / handling, submission of specimen other than nasopharyngeal swab, presence of viral mutation(s) within the areas targeted by this assay, and inadequate number of viral copies (<250 copies / mL). A negative result must be combined with clinical observations, patient history, and epidemiological information.  Fact Sheet for Patients:   BoilerBrush.com.cy  Fact Sheet for Healthcare Providers: https://pope.com/  This test is not yet approved or  cleared by the [place] FDA  and has been authorized for detection and/or diagnosis of SARS-CoV-2 by FDA under an Emergency Use Authorization (EUA).  This EUA will remain in effect (meaning this test can be used) for the duration of the COVID-19 declaration under Section 564(b)(1) of the Act, 21 U.S.C. section 360bbb-3(b)(1), unless the authorization is terminated or revoked sooner.  Performed at [facility], [address] [place], [place] [address]     Procedures/Studies: CT ABDOMEN PELVIS W CONTRAST  Result Date: 09/15/2019 CLINICAL DATA:  Left lower quadrant pain for 3 weeks,  worse over the past few days. Diverticulitis suspected. EXAM: CT ABDOMEN AND PELVIS WITH CONTRAST TECHNIQUE: Multidetector CT imaging of the abdomen and pelvis was performed using the standard protocol following bolus administration of intravenous contrast. CONTRAST:  OMNIPAQUE IOHEXOL 300 MG/ML  SOLN COMPARISON:  CT 07/13/2018 FINDINGS: Lower chest: Minor subsegmental atelectasis in the left lung base. Moderate to large hiatal hernia. Hepatobiliary: Enlarged liver spanning 24 cm cranial caudal. Postcholecystectomy. Common bile duct measures 17 mm at the porta hepatis with normal tapering. This appears similar to prior exam. Pancreas: No ductal dilatation or inflammation. Spleen: Normal in size without focal abnormality. Adrenals/Urinary Tract: Normal adrenal glands. Lobulated renal contours without hydronephrosis. No significant perinephric edema. There is symmetric excretion on delayed phase imaging. Urinary bladder is partially distended. No bladder wall thickening. Stomach/Bowel: Moderately large hiatal hernia. Greater than 50% of the stomach is intrathoracic. Normal positioning of the duodenum. There is no small bowel obstruction or inflammatory change. Normal appendix. Multifocal colonic diverticulosis, throughout the entire colon. Diverticular most prominent in the sigmoid colon. Minimal faint. Diverticular fat stranding at the  junction of the descending and sigmoid, series 2, image 62. No perforation or abscess. Vascular/Lymphatic: Aortic atherosclerosis. No aortic aneurysm. Patent portal vein. Multiple small retroperitoneal nodes measure up to 7 mm. 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 There are no enlarged lymph nodes in the abdomen or pelvis. Reproductive: Uterus surgically absent. Ovaries are quiescent. No adnexal mass. Other: No free air, free fluid, or intra-abdominal fluid collection. Musculoskeletal: Spine degenerative change with primarily facet hypertrophy. There is degenerative disc disease at L5-S1. IMPRESSION: 1. Acute uncomplicated diverticulitis at the junction of the descending and sigmoid colon. No perforation or abscess. 2. Moderately large hiatal hernia. 3. Hepatomegaly. Aortic Atherosclerosis (ICD10-I70.0). Electronically Signed   By: Narda Rutherford M.D.   On: 09/15/2019 22:14    Time coordinating discharge: Over 30 minutes  SIGNED:   Alessandra Bevels, MD  [facility] 09/21/2019, 4:34 PM

## 2019-09-22 ENCOUNTER — Other Ambulatory Visit: Payer: Self-pay | Admitting: Cardiology

## 2019-10-18 ENCOUNTER — Other Ambulatory Visit: Payer: Self-pay | Admitting: Cardiology

## 2019-12-01 ENCOUNTER — Other Ambulatory Visit: Payer: Self-pay | Admitting: *Deleted

## 2019-12-01 ENCOUNTER — Ambulatory Visit
Admission: RE | Admit: 2019-12-01 | Discharge: 2019-12-01 | Disposition: A | Discharge status: Home / Self Care | Payer: Self-pay | Source: Ambulatory Visit | Attending: Specialist | Admitting: Specialist

## 2019-12-01 DIAGNOSIS — K449 Diaphragmatic hernia without obstruction or gangrene: Secondary | ICD-10-CM

## 2019-12-01 DIAGNOSIS — K219 Gastro-esophageal reflux disease without esophagitis: Secondary | ICD-10-CM

## 2019-12-01 DIAGNOSIS — R1032 Left lower quadrant pain: Secondary | ICD-10-CM

## 2019-12-01 MED ORDER — IOPAMIDOL (ISOVUE-300) INJECTION 61%
100.0000 mL | Freq: Once | INTRAVENOUS | Status: AC | PRN
  Administered 2019-12-01: 100 mL via INTRAVENOUS

## 2020-01-11 DIAGNOSIS — J9383 Other pneumothorax: Secondary | ICD-10-CM | POA: Insufficient documentation

## 2020-01-16 ENCOUNTER — Other Ambulatory Visit: Payer: Self-pay | Admitting: *Deleted

## 2020-01-16 DIAGNOSIS — Z1231 Encounter for screening mammogram for malignant neoplasm of breast: Secondary | ICD-10-CM

## 2020-01-30 ENCOUNTER — Other Ambulatory Visit: Payer: Self-pay | Admitting: Cardiology

## 2020-02-12 ENCOUNTER — Other Ambulatory Visit: Payer: Self-pay | Admitting: Cardiology

## 2020-02-28 ENCOUNTER — Other Ambulatory Visit: Payer: Self-pay | Admitting: Cardiology

## 2020-03-01 ENCOUNTER — Other Ambulatory Visit: Payer: Self-pay | Admitting: Cardiology

## 2020-03-26 DIAGNOSIS — E1165 Type 2 diabetes mellitus with hyperglycemia: Secondary | ICD-10-CM | POA: Insufficient documentation

## 2020-04-26 DIAGNOSIS — Z0000 Encounter for general adult medical examination without abnormal findings: Secondary | ICD-10-CM | POA: Insufficient documentation

## 2020-05-17 ENCOUNTER — Other Ambulatory Visit: Payer: Self-pay | Admitting: Cardiology

## 2020-07-13 DIAGNOSIS — J309 Allergic rhinitis, unspecified: Secondary | ICD-10-CM | POA: Insufficient documentation

## 2020-07-20 ENCOUNTER — Other Ambulatory Visit: Payer: Self-pay | Admitting: Cardiology

## 2020-08-06 ENCOUNTER — Other Ambulatory Visit: Payer: Self-pay

## 2020-08-06 ENCOUNTER — Ambulatory Visit
Admission: RE | Admit: 2020-08-06 | Discharge: 2020-08-06 | Disposition: A | Discharge status: Home / Self Care | Payer: 59 | Source: Ambulatory Visit | Attending: *Deleted | Admitting: *Deleted

## 2020-08-06 DIAGNOSIS — Z1231 Encounter for screening mammogram for malignant neoplasm of breast: Secondary | ICD-10-CM | POA: Diagnosis present

## 2020-09-30 ENCOUNTER — Other Ambulatory Visit: Payer: Self-pay | Admitting: Cardiology

## 2020-11-04 ENCOUNTER — Other Ambulatory Visit: Payer: Self-pay | Admitting: Cardiology

## 2020-11-16 ENCOUNTER — Other Ambulatory Visit: Payer: Self-pay | Admitting: Cardiology

## 2020-11-17 ENCOUNTER — Encounter (HOSPITAL_COMMUNITY): Payer: Self-pay | Admitting: Emergency Medicine

## 2020-11-17 ENCOUNTER — Other Ambulatory Visit: Payer: Self-pay

## 2020-11-17 ENCOUNTER — Emergency Department (HOSPITAL_COMMUNITY)
Admission: EM | Admit: 2020-11-17 | Discharge: 2020-11-18 | Disposition: A | Discharge status: Home / Self Care | Payer: BC Managed Care – PPO | Attending: Emergency Medicine | Admitting: Emergency Medicine

## 2020-11-17 DIAGNOSIS — I10 Essential (primary) hypertension: Secondary | ICD-10-CM | POA: Insufficient documentation

## 2020-11-17 DIAGNOSIS — R000 Tachycardia, unspecified: Secondary | ICD-10-CM | POA: Diagnosis present

## 2020-11-17 DIAGNOSIS — I471 Supraventricular tachycardia: Secondary | ICD-10-CM | POA: Diagnosis not present

## 2020-11-17 LAB — BASIC METABOLIC PANEL
Anion gap: 11 (ref 5–15)
BUN: 12 mg/dL (ref 8–23)
CO2: 22 mmol/L (ref 22–32)
Calcium: 9 mg/dL (ref 8.9–10.3)
Chloride: 105 mmol/L (ref 98–111)
Creatinine, Ser: 1.06 mg/dL — ABNORMAL HIGH (ref 0.44–1.00)
GFR, Estimated: 59 mL/min — ABNORMAL LOW (ref >60)
Glucose, Bld: 170 mg/dL — ABNORMAL HIGH (ref 70–99)
Potassium: 3.8 mmol/L (ref 3.5–5.1)
Sodium: 138 mmol/L (ref 135–145)

## 2020-11-17 LAB — CBC WITH DIFFERENTIAL/PLATELET
Abs Immature Granulocytes: 0.04 10*3/uL (ref 0.00–0.07)
Basophils Absolute: 0.1 10*3/uL (ref 0.0–0.1)
Basophils Relative: 1 %
Eosinophils Absolute: 0.5 10*3/uL (ref 0.0–0.5)
Eosinophils Relative: 4 %
HCT: 45 % (ref 36.0–46.0)
Hemoglobin: 14.6 g/dL (ref 12.0–15.0)
Immature Granulocytes: 0 %
Lymphocytes Relative: 34 %
Lymphs Abs: 3.8 10*3/uL (ref 0.7–4.0)
MCH: 27.8 pg (ref 26.0–34.0)
MCHC: 32.4 g/dL (ref 30.0–36.0)
MCV: 85.6 fL (ref 80.0–100.0)
Monocytes Absolute: 0.6 10*3/uL (ref 0.1–1.0)
Monocytes Relative: 5 %
Neutro Abs: 6.2 10*3/uL (ref 1.7–7.7)
Neutrophils Relative %: 56 %
Platelets: 329 10*3/uL (ref 150–400)
RBC: 5.26 MIL/uL — ABNORMAL HIGH (ref 3.87–5.11)
RDW: 15.7 % — ABNORMAL HIGH (ref 11.5–15.5)
WBC: 11.2 10*3/uL — ABNORMAL HIGH (ref 4.0–10.5)
nRBC: 0 % (ref 0.0–0.2)

## 2020-11-17 MED ORDER — SODIUM CHLORIDE 0.9 % IV BOLUS
500.0000 mL | Freq: Once | INTRAVENOUS | Status: AC
  Administered 2020-11-17: 500 mL via INTRAVENOUS

## 2020-11-17 MED ORDER — ADENOSINE 6 MG/2ML IV SOLN
INTRAVENOUS | Status: AC
  Administered 2020-11-17: 6 mg
  Filled 2020-11-17: qty 6

## 2020-11-17 MED ORDER — METOPROLOL TARTRATE 5 MG/5ML IV SOLN
5.0000 mg | Freq: Once | INTRAVENOUS | Status: AC
  Administered 2020-11-17: 5 mg via INTRAVENOUS
  Filled 2020-11-17: qty 5

## 2020-11-17 NOTE — ED Triage Notes (Signed)
Pt reports sudden onset tachycardia. Hx SVT, states she took 2 of her metoprolol without change.

## 2020-11-17 NOTE — Discharge Instructions (Addendum)
As we discussed please follow up with your cardiologist.  IF this happens again and you are unable to convert please return to the emergency room.

## 2020-11-17 NOTE — ED Notes (Signed)
Patient ambulated to toilet  

## 2020-11-17 NOTE — ED Notes (Signed)
HR = 97/min , converted to NSR after 1 dose of IV Adenosine , denies chest pain / respirations unlabored .

## 2020-11-17 NOTE — ED Provider Notes (Signed)
[facility] EMERGENCY DEPARTMENT Provider Note   CSN: [id number] Arrival date & time: 11/17/20  2156     History Chief Complaint  Patient presents with   Tachycardia    Yolanda Brown is a 60 y.o. female with a past medical history of SVT, Barrett's esophagus, GERD, diverticulitis, who presents today for evaluation of rapid heart rate.  She states that about an hour prior to arrival she started feeling very poorly and felt her heart racing.  She has had SVT at least three times before and states this feels the same.  She reports that she feels slightly short of breath and generally poorly since she went into SVT.  She denies any fevers or recent illness.  She took two doses of her metoprolol PTA with out any change.  She additionally attempted vagal maneuvers at home prior to arrival.  She states that she has had a stress test before that was normal.  She states that her first episode she was able to convert with vagal maneuvers.   Last echo was performed in 2019 showing an EF of 55 to 60%.  Patient is an ICU nurse.  She denies any leg swelling, hemoptysis, history of DVT/PE. She was not having any symptoms until she went into SVT.     HPI     Past Medical History:  Diagnosis Date   Allergy    Anxiety 2013   has seen Dr. Evelene Croon prior   Barrett esophagus    Depression 2013   Dr. Evelene Croon prior   Diverticulitis 06/2013   3 total episodes   DVT (deep vein thrombosis) in pregnancy    GERD (gastroesophageal reflux disease)    Hypertension    Paroxysmal SVT (supraventricular tachycardia) (HCC)    Echo 8/19:  EF 55-60, Gr 1 DD, normal RVSF   Vitamin D deficiency     Patient Active Problem List   Diagnosis Date Noted   Diarrhea    Acute diverticulitis 09/15/2019   Obesity 08/06/2015   Impaired fasting blood sugar 08/06/2015   Routine general medical examination at a health care facility 08/06/2015   Vaccine counseling 08/06/2015   Screening for breast cancer  08/06/2015   Chronic fatigue 08/06/2015   Vitamin D deficiency 08/06/2015   Non-restorative sleep 08/06/2015   Snoring 08/06/2015   Daytime somnolence 08/06/2015   Anxiety and depression 01/25/2015   History of DVT (deep vein thrombosis) 01/25/2015   Essential hypertension 01/25/2015    Past Surgical History:  Procedure Laterality Date   ABDOMINAL HYSTERECTOMY     fibroids, still has ovaries   BREAST BIOPSY Left 2012   stereo/neg   CHOLECYSTECTOMY     COLONOSCOPY  2011   [place], [place]; polyp and diverticulosis   WISDOM TOOTH EXTRACTION       OB History   No obstetric history on file.     Family History  Problem Relation Age of Onset   Diabetes Mother    Heart disease Mother    Cancer Father        prostate, mets   Diabetes Father    Hypertension Sister    Diabetes Sister    Stroke Sister    Hypertension Brother    Obesity Brother    Hypertension Brother    Other Brother        died of sepsis s/p perforated bowel   Obesity Brother    Heart disease Sister    Hypertension Sister    Diabetes Sister  Hypertension Other    Diabetes Other    Cancer Paternal Uncle        13 uncles all died with cancer, various types    Social History   Tobacco Use   Smoking status: Never   Smokeless tobacco: Never  Vaping Use   Vaping Use: Never used  Substance Use Topics   Alcohol use: Yes    Alcohol/week: 2.0 standard drinks    Types: 2 Glasses of wine per week    Comment: rare   Drug use: No    Home Medications Prior to Admission medications   Medication Sig Start Date End Date Taking? Authorizing Provider  ALPRAZolam (XANAX) 0.5 MG tablet take 1 TABLET BY MOUTH TWICE DAILY AS NEEDED for anxiety Patient taking differently: Take 0.5 mg by mouth 2 (two) times daily as needed for anxiety.  11/13/16   Tysinger, Kermit Baloavid S, PA-C  amLODipine (NORVASC) 5 MG tablet Take 1 tablet (5 mg total) by mouth daily. Please make overdue appt with Dr. Anne FuSkains before anymore refills.  Thank you 2nd attempt 11/05/20   Jake BatheSkains, Mark C, MD  metFORMIN (GLUCOPHAGE) 1000 MG tablet Take 1,000 mg by mouth 2 (two) times daily with a meal.    [provider]  metoprolol succinate (TOPROL-XL) 50 MG 24 hr tablet TAKE 1 TAB DAILY. MAY TAKE EXTRA TAB FOR SVT EPISODE AS NEEDED. PLEASE MAKE OVERDUE APPT- DR. Anne FuSKAINS 09/22/19   Jake BatheSkains, Mark C, MD  Multiple Vitamin (MULITIVITAMIN WITH MINERALS) TABS Take 1 tablet by mouth every morning.     [provider]  Omeprazole-Sodium Bicarbonate (ZEGERID OTC PO) Take 1 tablet by mouth daily.    [provider]  ondansetron (ZOFRAN) 4 MG tablet Take 1 tablet (4 mg total) by mouth every 6 (six) hours as needed for nausea. 09/21/19   Alessandra BevelsKamineni, Neelima, MD  triamterene-hydrochlorothiazide (MAXZIDE-25) 37.5-25 MG tablet TAKE 1 TABLET BY MOUTH DAILY. PLEASE MAKE OVERDUE APPT WITH DR. Anne FuSKAINS OR REFILL WITH PCP BEFORE ANYMORE REFILLS. 3RD AND FINAL ATTEMPT 03/04/20   Jake BatheSkains, Mark C, MD    Allergies    Aspirin  Review of Systems   Review of Systems  Constitutional:  Negative for chills and fever.  Respiratory:  Positive for chest tightness (Since going into SVT.) and shortness of breath.   Cardiovascular:  Positive for chest pain.  Gastrointestinal:  Negative for abdominal pain and nausea.  Genitourinary:  Negative for dysuria.  Musculoskeletal:  Negative for arthralgias and myalgias.  Skin:  Negative for color change and rash.  Neurological:  Negative for weakness and numbness.  Psychiatric/Behavioral:  Negative for confusion.   All other systems reviewed and are negative.  Physical Exam Updated Vital Signs BP 118/74 (BP Location: Right Arm)   Pulse 92   Temp (!) 96.4 F (35.8 C) (Temporal)   Resp 16   SpO2 99%   Physical Exam Vitals and nursing note reviewed.  Constitutional:      Appearance: She is not diaphoretic.     Comments: Patient appears uncomfortable.   HENT:     Head: Normocephalic and atraumatic.  Eyes:      General: No scleral icterus.       Right eye: No discharge.        Left eye: No discharge.     Conjunctiva/sclera: Conjunctivae normal.  Cardiovascular:     Rate and Rhythm: Regular rhythm. Tachycardia present.     Pulses: Normal pulses.  Pulmonary:     Effort: Pulmonary effort is normal. No  respiratory distress.     Breath sounds: No stridor.  Abdominal:     General: There is no distension.  Musculoskeletal:        General: No deformity.     Cervical back: Normal range of motion and neck supple.     Right lower leg: No edema.     Left lower leg: No edema.  Skin:    General: Skin is warm and dry.  Neurological:     Mental Status: She is alert.     Motor: No abnormal muscle tone.     Comments: Patient is awake and alert.  Answers questions appropriately without difficulty.  Psychiatric:        Behavior: Behavior normal.    ED Results / Procedures / Treatments   Labs (all labs ordered are listed, but only abnormal results are displayed) Labs Reviewed  CBC WITH DIFFERENTIAL/PLATELET - Abnormal; Notable for the following components:      Result Value   WBC 11.2 (*)    RBC 5.26 (*)    RDW 15.7 (*)    All other components within normal limits  BASIC METABOLIC PANEL - Abnormal; Notable for the following components:   Glucose, Bld 170 (*)    Creatinine, Ser 1.06 (*)    GFR, Estimated 59 (*)    All other components within normal limits    EKG EKG Interpretation  Date/Time:  Sunday November 17 2020 22:25:08 EDT Ventricular Rate:  99 PR Interval:  187 QRS Duration: 87 QT Interval:  348 QTC Calculation: 447 R Axis:   18 Text Interpretation: Sinus rhythm after 6 mg adenosine Confirmed by Jacalyn Lefevre [id number]) on 11/17/2020 10:27:36 PM  Radiology No results found.  Procedures .Critical Care Performed by: Cristina Gong, PA-C Authorized by: Cristina Gong, PA-C   Critical care provider statement:    Critical care time (minutes):  45   Critical care  time was exclusive of:  Separately billable procedures and treating other patients and teaching time   Critical care was necessary to treat or prevent imminent or life-threatening deterioration of the following conditions:  Cardiac failure   Critical care was time spent personally by me on the following activities:  Discussions with consultants, evaluation of patient's response to treatment, examination of patient, ordering and performing treatments and interventions, ordering and review of laboratory studies, pulse oximetry, re-evaluation of patient's condition, obtaining history from patient or surrogate and review of old charts   I assumed direction of critical care for this patient from another provider in my specialty: no     Medications Ordered in ED Medications  adenosine (ADENOCARD) 6 MG/2ML injection (6 mg  Given 11/17/20 2222)  metoprolol tartrate (LOPRESSOR) injection 5 mg (5 mg Intravenous Given 11/17/20 2239)  sodium chloride 0.9 % bolus 500 mL (0 mLs Intravenous Stopped 11/17/20 2300)    ED Course  I have reviewed the triage vital signs and the nursing notes.  Pertinent labs & imaging results that were available during my care of the patient were reviewed by me and considered in my medical decision making (see chart for details).  Clinical Course as of 11/18/20 0000  Sun Nov 17, 2020  2259 Patient re-evaluated, HR in the 90s.  Will trial off oxygen.  She reports that she usually runs about 95% on RA.  She feels much better than she did when she came in.  [EH]  2344 Patient is re-evaluated, she feels much better and is ready to go home.  [EH]  Clinical Course User Index [EH] Norman Clay   MDM Rules/Calculators/A&P                          Yolanda Brown is a 60 year old woman who presents today for evaluation of SVT.  She has been in SVT before and attempted vagal maneuvers prior to arrival at home without success. On arrival here she is in SVT.  She has some  mild chest tightness and feels slightly short of breath and just generally unwell.  IV is established.  Vagal maneuvers were again attempted without success, and she is given 6 mg of adenosine after which she was able to convert into a sinus rhythm at 102.  She was then given 5 IV of Lopressor which brought her down into the 90s.  After this she felt much better.  Originally her oxygen was 91 to 92 percent on room air.  She was placed on 2 liters .  After her heart rate improved she was taken off this. Chart review shows that her TSH has been checked recently.    Patient was observed in the emergency room for an hour after lopressor with out recurrent arrhythmia.    She is instructed to follow up with her cardiologist.    She was able to ambulate in the ER with out difficulty.   Return precautions were discussed with patient who states their understanding.  At the time of discharge patient denied any unaddressed complaints or concerns.  Patient is agreeable for discharge home.  Note: Portions of this report may have been transcribed using voice recognition software. Every effort was made to ensure accuracy; however, inadvertent computerized transcription errors may be present  Final Clinical Impression(s) / ED Diagnoses Final diagnoses:  SVT (supraventricular tachycardia) [facility])    Rx / DC Orders ED Discharge Orders     None        Cristina Gong, PA-C 11/18/20 0001    Jacalyn Lefevre, MD 11/20/20 [phone]

## 2021-01-21 ENCOUNTER — Other Ambulatory Visit: Payer: Self-pay | Admitting: Cardiology

## 2021-01-21 DIAGNOSIS — B356 Tinea cruris: Secondary | ICD-10-CM | POA: Insufficient documentation

## 2021-01-31 ENCOUNTER — Other Ambulatory Visit: Payer: Self-pay | Admitting: Cardiology

## 2021-02-19 ENCOUNTER — Other Ambulatory Visit: Payer: Self-pay | Admitting: Cardiology

## 2021-02-20 DIAGNOSIS — M5430 Sciatica, unspecified side: Secondary | ICD-10-CM | POA: Insufficient documentation

## 2021-03-26 DIAGNOSIS — R809 Proteinuria, unspecified: Secondary | ICD-10-CM | POA: Insufficient documentation

## 2021-03-26 DIAGNOSIS — E1169 Type 2 diabetes mellitus with other specified complication: Secondary | ICD-10-CM | POA: Insufficient documentation

## 2021-05-02 ENCOUNTER — Other Ambulatory Visit: Payer: Self-pay

## 2021-05-02 ENCOUNTER — Ambulatory Visit
Admission: EM | Admit: 2021-05-02 | Discharge: 2021-05-02 | Disposition: A | Discharge status: Home / Self Care | Payer: 59 | Attending: Internal Medicine | Admitting: Internal Medicine

## 2021-05-02 DIAGNOSIS — K5792 Diverticulitis of intestine, part unspecified, without perforation or abscess without bleeding: Secondary | ICD-10-CM

## 2021-05-02 MED ORDER — CIPROFLOXACIN HCL 500 MG PO TABS: 500.0000 mg | ORAL_TABLET | Freq: Two times a day (BID) | ORAL | 0 refills | Status: AC

## 2021-05-02 MED ORDER — ONDANSETRON 4 MG PO TBDP
4.0000 mg | ORAL_TABLET | Freq: Once | ORAL | Status: AC
Start: 2021-05-02 — End: 2021-05-02
  Administered 2021-05-02: 4 mg via ORAL

## 2021-05-02 MED ORDER — KETOROLAC TROMETHAMINE 30 MG/ML IJ SOLN
30.0000 mg | Freq: Once | INTRAMUSCULAR | Status: AC
Start: 2021-05-02 — End: 2021-05-02
  Administered 2021-05-02: 30 mg via INTRAMUSCULAR

## 2021-05-02 MED ORDER — METRONIDAZOLE 500 MG PO TABS: 500.0000 mg | ORAL_TABLET | Freq: Two times a day (BID) | ORAL | 0 refills | Status: DC

## 2021-05-02 NOTE — ED Provider Notes (Signed)
[facility]    CSN: [id number] Arrival date & time: 05/02/21  1522      History   Chief Complaint Chief Complaint  Patient presents with   Abdominal Pain    HPI Yolanda Brown is a 61 y.o. female.   Patient presents with left lower quadrant pain with associated nausea without vomiting that started this morning.  Patient rates the pain 8/10 on pain scale and is describes it as "cramping".  Patient reports history of diverticulitis and states that this feels the same.  She typically is treated with Cipro and Flagyl with resolution.  She reports that she had to be hospitalized last time diverticulosis work-up occurred in 2021.  Denies vomiting or diarrhea but does report that her last bowel movement was approximately 2 days ago.  Denies any blood in stool.  Denies any fevers, body aches, chills.   Abdominal Pain  Past Medical History:  Diagnosis Date   Allergy    Anxiety 2013   has seen Dr. Toy Care prior   Barrett esophagus    Depression 2013   Dr. Toy Care prior   Diverticulitis 06/2013   3 total episodes   DVT (deep vein thrombosis) in pregnancy    GERD (gastroesophageal reflux disease)    Hypertension    Paroxysmal SVT (supraventricular tachycardia) ([place])    Echo 8/19:  EF 55-60, Gr 1 DD, normal RVSF   Vitamin D deficiency     Patient Active Problem List   Diagnosis Date Noted   Diarrhea    Acute diverticulitis 09/15/2019   Obesity 08/06/2015   Impaired fasting blood sugar 08/06/2015   Routine general medical examination at a health care facility 08/06/2015   Vaccine counseling 08/06/2015   Screening for breast cancer 08/06/2015   Chronic fatigue 08/06/2015   Vitamin D deficiency 08/06/2015   Non-restorative sleep 08/06/2015   Snoring 08/06/2015   Daytime somnolence 08/06/2015   Anxiety and depression 01/25/2015   History of DVT (deep vein thrombosis) 01/25/2015   Essential hypertension 01/25/2015    Past Surgical History:  Procedure Laterality Date    ABDOMINAL HYSTERECTOMY     fibroids, still has ovaries   BREAST BIOPSY Left 2012   stereo/neg   CHOLECYSTECTOMY     COLONOSCOPY  2011   [place], [place]; polyp and diverticulosis   WISDOM TOOTH EXTRACTION      OB History   No obstetric history on file.      Home Medications    Prior to Admission medications   Medication Sig Start Date End Date Taking? Authorizing Provider  ciprofloxacin (CIPRO) 500 MG tablet Take 1 tablet (500 mg total) by mouth every 12 (twelve) hours for 10 days. 05/02/21 05/12/21 Yes Joana Nolton, Michele Rockers, FNP  metroNIDAZOLE (FLAGYL) 500 MG tablet Take 1 tablet (500 mg total) by mouth 2 (two) times daily. 05/02/21  Yes Jahfari Ambers, Michele Rockers, FNP  ALPRAZolam Duanne Moron) 0.5 MG tablet take 1 TABLET BY MOUTH TWICE DAILY AS NEEDED for anxiety Patient taking differently: Take 0.5 mg by mouth 2 (two) times daily as needed for anxiety.  11/13/16   Tysinger, Camelia Eng, PA-C  amLODipine (NORVASC) 5 MG tablet Take 1 tablet (5 mg total) by mouth daily. Please make overdue appt with Dr. Marlou Porch before anymore refills. Thank you 3rd and Final Attempt 01/21/21   Jerline Pain, MD  metFORMIN (GLUCOPHAGE) 1000 MG tablet Take 1,000 mg by mouth 2 (two) times daily with a meal.    [provider]  metoprolol succinate (TOPROL-XL)  50 MG 24 hr tablet TAKE 1 TAB DAILY. MAY TAKE EXTRA TAB FOR SVT EPISODE AS NEEDED. PLEASE MAKE OVERDUE APPT- DR. Marlou Porch 09/22/19   Jerline Pain, MD  Multiple Vitamin (MULITIVITAMIN WITH MINERALS) TABS Take 1 tablet by mouth every morning.     [provider]  Omeprazole-Sodium Bicarbonate (ZEGERID OTC PO) Take 1 tablet by mouth daily.    [provider]  ondansetron (ZOFRAN) 4 MG tablet Take 1 tablet (4 mg total) by mouth every 6 (six) hours as needed for nausea. 09/21/19   [place] Shi, MD  triamterene-hydrochlorothiazide (MAXZIDE-25) 37.5-25 MG tablet TAKE 1 TABLET BY MOUTH DAILY. PLEASE MAKE OVERDUE APPT WITH DR. Marlou Porch OR REFILL WITH PCP BEFORE  ANYMORE REFILLS. 3RD AND FINAL ATTEMPT 03/04/20   Jerline Pain, MD    Family History Family History  Problem Relation Age of Onset   Diabetes Mother    Heart disease Mother    Cancer Father        prostate, mets   Diabetes Father    Hypertension Sister    Diabetes Sister    Stroke Sister    Hypertension Brother    Obesity Brother    Hypertension Brother    Other Brother        died of sepsis s/p perforated bowel   Obesity Brother    Heart disease Sister    Hypertension Sister    Diabetes Sister    Hypertension Other    Diabetes Other    Cancer Paternal Uncle        5 uncles all died with cancer, various types    Social History Social History   Tobacco Use   Smoking status: Never   Smokeless tobacco: Never  Vaping Use   Vaping Use: Never used  Substance Use Topics   Alcohol use: Yes    Alcohol/week: 2.0 standard drinks    Types: 2 Glasses of wine per week    Comment: rare   Drug use: No     Allergies   Aspirin   Review of Systems Review of Systems Per HPI  Physical Exam Triage Vital Signs ED Triage Vitals  Enc Vitals Group     BP 05/02/21 1555 119/80     Pulse Rate 05/02/21 1555 83     Resp 05/02/21 1555 18     Temp 05/02/21 1555 98.2 F (36.8 C)     Temp Source 05/02/21 1555 Oral     SpO2 05/02/21 1555 95 %     Weight --      Height --      Head Circumference --      Peak Flow --      Pain Score 05/02/21 1556 0     Pain Loc --      Pain Edu? --      Excl. in [place]? --    No data found.  Updated Vital Signs BP 119/80 (BP Location: Left Arm)    Pulse 83    Temp 98.2 F (36.8 C) (Oral)    Resp 18    SpO2 95%   Visual Acuity Right Eye Distance:   Left Eye Distance:   Bilateral Distance:    Right Eye Near:   Left Eye Near:    Bilateral Near:     Physical Exam Constitutional:      General: She is not in acute distress.    Appearance: Normal appearance. She is not toxic-appearing or diaphoretic.  HENT:     Head:  Normocephalic and  atraumatic.  Eyes:     Extraocular Movements: Extraocular movements intact.     Conjunctiva/sclera: Conjunctivae normal.  Cardiovascular:     Rate and Rhythm: Normal rate and regular rhythm.     Pulses: Normal pulses.     Heart sounds: Normal heart sounds.  Pulmonary:     Effort: Pulmonary effort is normal. No respiratory distress.     Breath sounds: Normal breath sounds.  Abdominal:     General: Bowel sounds are normal.     Palpations: Abdomen is soft.     Tenderness: There is abdominal tenderness in the left lower quadrant.  Neurological:     General: No focal deficit present.     Mental Status: She is alert and oriented to person, place, and time. Mental status is at baseline.  Psychiatric:        Mood and Affect: Mood normal.        Behavior: Behavior normal.        Thought Content: Thought content normal.        Judgment: Judgment normal.     UC Treatments / Results  Labs (all labs ordered are listed, but only abnormal results are displayed) Labs Reviewed - No data to display  EKG   Radiology No results found.  Procedures Procedures (including critical care time)  Medications Ordered in UC Medications  ketorolac (TORADOL) 30 MG/ML injection 30 mg (has no administration in time range)    Initial Impression / Assessment and Plan / UC Course  I have reviewed the triage vital signs and the nursing notes.  Pertinent labs & imaging results that were available during my care of the patient were reviewed by me and considered in my medical decision making (see chart for details).     Patient's physical exam and characteristic of pain is consistent with possible diverticulitis flareup.  Although, patient's pain is significant and patient has had to be hospitalized before.  Therefore, patient was advised to go to the hospital for more extensive evaluation and management to confirm that diverticulitis is present.  Patient declined going to the hospital.  Risks of not  going to hospital were discussed with patient.  Patient voiced understanding.  Will opt to treat with Cipro and Flagyl.  Toradol shot administered in urgent care today given patient has taken this safely before and has helped alleviate diverticulitis pain.  Discussed strict ER precautions.  Patient verbalized understanding and was agreeable with plan. Final Clinical Impressions(s) / UC Diagnoses   Final diagnoses:  Acute diverticulitis     Discharge Instructions      You have been prescribed 2 antibiotics for possible diverticulitis.  Please go to the hospital if symptoms persist or worsen.    ED Prescriptions     Medication Sig Dispense Auth. Provider   ciprofloxacin (CIPRO) 500 MG tablet Take 1 tablet (500 mg total) by mouth every 12 (twelve) hours for 10 days. 20 tablet [place], [place], [place]   metroNIDAZOLE (FLAGYL) 500 MG tablet Take 1 tablet (500 mg total) by mouth 2 (two) times daily. 14 tablet [place], Michele Rockers, [place]      PDMP not reviewed this encounter.   Teodora Medici, [place] 05/02/21 [phone]

## 2021-05-02 NOTE — ED Triage Notes (Signed)
Pt c/o nausea and LLQ abd pain stating she has diverticulitis and concerned for flare up. Onset last night. States pcp tx with cipro, flagyl and hydrocodone. Was unable to see pcp today due to no schedule availability.

## 2021-05-02 NOTE — Discharge Instructions (Signed)
You have been prescribed 2 antibiotics for possible diverticulitis.  Please go to the hospital if symptoms persist or worsen.

## 2021-11-18 DIAGNOSIS — M47816 Spondylosis without myelopathy or radiculopathy, lumbar region: Secondary | ICD-10-CM | POA: Insufficient documentation

## 2021-11-18 DIAGNOSIS — M461 Sacroiliitis, not elsewhere classified: Secondary | ICD-10-CM | POA: Insufficient documentation

## 2022-03-12 ENCOUNTER — Other Ambulatory Visit: Payer: Self-pay

## 2022-03-12 ENCOUNTER — Emergency Department (HOSPITAL_BASED_OUTPATIENT_CLINIC_OR_DEPARTMENT_OTHER)
Admission: EM | Admit: 2022-03-12 | Discharge: 2022-03-12 | Disposition: A | Discharge status: Home / Self Care | Payer: Self-pay | Attending: Emergency Medicine | Admitting: Emergency Medicine

## 2022-03-12 ENCOUNTER — Emergency Department (HOSPITAL_BASED_OUTPATIENT_CLINIC_OR_DEPARTMENT_OTHER): Payer: Self-pay

## 2022-03-12 ENCOUNTER — Emergency Department (HOSPITAL_COMMUNITY)
Admission: EM | Admit: 2022-03-12 | Discharge: 2022-03-12 | Discharge status: Against Medical Advice | Payer: Self-pay | Attending: Emergency Medicine | Admitting: Emergency Medicine

## 2022-03-12 ENCOUNTER — Emergency Department (HOSPITAL_COMMUNITY): Payer: Self-pay

## 2022-03-12 ENCOUNTER — Encounter (HOSPITAL_BASED_OUTPATIENT_CLINIC_OR_DEPARTMENT_OTHER): Payer: Self-pay | Admitting: Urology

## 2022-03-12 ENCOUNTER — Encounter (HOSPITAL_COMMUNITY): Payer: Self-pay

## 2022-03-12 DIAGNOSIS — R112 Nausea with vomiting, unspecified: Secondary | ICD-10-CM | POA: Insufficient documentation

## 2022-03-12 DIAGNOSIS — R197 Diarrhea, unspecified: Secondary | ICD-10-CM | POA: Insufficient documentation

## 2022-03-12 DIAGNOSIS — Z5321 Procedure and treatment not carried out due to patient leaving prior to being seen by health care provider: Secondary | ICD-10-CM | POA: Insufficient documentation

## 2022-03-12 DIAGNOSIS — R1084 Generalized abdominal pain: Secondary | ICD-10-CM | POA: Insufficient documentation

## 2022-03-12 DIAGNOSIS — G8918 Other acute postprocedural pain: Secondary | ICD-10-CM | POA: Insufficient documentation

## 2022-03-12 LAB — CBC
HCT: 41.5 % (ref 36.0–46.0)
Hemoglobin: 13.4 g/dL (ref 12.0–15.0)
MCH: 27.2 pg (ref 26.0–34.0)
MCHC: 32.3 g/dL (ref 30.0–36.0)
MCV: 84.3 fL (ref 80.0–100.0)
Platelets: 503 10*3/uL — ABNORMAL HIGH (ref 150–400)
RBC: 4.92 MIL/uL (ref 3.87–5.11)
RDW: 14.3 % (ref 11.5–15.5)
WBC: 17.9 10*3/uL — ABNORMAL HIGH (ref 4.0–10.5)
nRBC: 0 % (ref 0.0–0.2)

## 2022-03-12 LAB — COMPREHENSIVE METABOLIC PANEL
ALT: 17 U/L (ref 0–44)
AST: 23 U/L (ref 15–41)
Albumin: 3.6 g/dL (ref 3.5–5.0)
Alkaline Phosphatase: 50 U/L (ref 38–126)
Anion gap: 15 (ref 5–15)
BUN: 10 mg/dL (ref 8–23)
CO2: 24 mmol/L (ref 22–32)
Calcium: 8.8 mg/dL — ABNORMAL LOW (ref 8.9–10.3)
Chloride: 99 mmol/L (ref 98–111)
Creatinine, Ser: 0.81 mg/dL (ref 0.44–1.00)
GFR, Estimated: >60 mL/min (ref >60)
Glucose, Bld: 120 mg/dL — ABNORMAL HIGH (ref 70–99)
Potassium: 3.3 mmol/L — ABNORMAL LOW (ref 3.5–5.1)
Sodium: 138 mmol/L (ref 135–145)
Total Bilirubin: 0.8 mg/dL (ref 0.3–1.2)
Total Protein: 8.4 g/dL — ABNORMAL HIGH (ref 6.5–8.1)

## 2022-03-12 LAB — LIPASE, BLOOD: Lipase: 45 U/L (ref 11–51)

## 2022-03-12 MED ORDER — IOHEXOL 300 MG/ML  SOLN
100.0000 mL | Freq: Once | INTRAMUSCULAR | Status: AC | PRN
  Administered 2022-03-12: 100 mL via INTRAVENOUS

## 2022-03-12 MED ORDER — IOHEXOL 300 MG/ML  SOLN: 100.0000 mL | Freq: Once | INTRAMUSCULAR | Status: DC | PRN

## 2022-03-12 MED ORDER — MORPHINE SULFATE (PF) 4 MG/ML IV SOLN
4.0000 mg | Freq: Once | INTRAVENOUS | Status: AC
  Administered 2022-03-12: 4 mg via INTRAVENOUS
  Filled 2022-03-12: qty 1

## 2022-03-12 MED ORDER — SODIUM CHLORIDE 0.9 % IV BOLUS
1000.0000 mL | Freq: Once | INTRAVENOUS | Status: AC
  Administered 2022-03-12: 1000 mL via INTRAVENOUS

## 2022-03-12 MED ORDER — ONDANSETRON HCL 4 MG/2ML IJ SOLN
4.0000 mg | Freq: Once | INTRAMUSCULAR | Status: AC
  Administered 2022-03-12: 4 mg via INTRAVENOUS
  Filled 2022-03-12: qty 2

## 2022-03-12 NOTE — ED Notes (Signed)
Paged sent to Dr. Prudence Davidson with the [facility]

## 2022-03-12 NOTE — ED Notes (Signed)
Per pt, O2 is baseline high 80's. Pt report she only uses a CPAP at home and is not on O2. States her doctors are not worries about it because she is comfortable at that O2 sat. Provider here reports that is okay. Primary RN aware and reports the same.

## 2022-03-12 NOTE — ED Triage Notes (Signed)
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 Patient c/o N/V and abdominal pain x 2 days.

## 2022-03-12 NOTE — ED Notes (Addendum)
Page sent to Bariatric Surgery at [place] [phone]

## 2022-03-12 NOTE — ED Provider Triage Note (Signed)
Emergency Medicine Provider Triage Evaluation Note  Yolanda Brown , a 62 y.o. female  was evaluated in triage.  Patient presenting with abdominal pain, nausea vomiting and diarrhea.  Had gastric bypass surgery in [place] 2 weeks ago.  No known sick contacts  Review of Systems  Positive:  Negative:   Physical Exam  SpO2 94%  Gen:   Awake, no distress   Resp:  Normal effort  MSK:   Moves extremities without difficulty  Other:  Mostly generalized tenderness but worst periumbilically  Medical Decision Making  Medically screening exam initiated at 1:07 PM.  Appropriate orders placed.  PEGGE CUMBERLEDGE was informed that the remainder of the evaluation will be completed by another provider, this initial triage assessment does not replace that evaluation, and the importance of remaining in the ED until their evaluation is complete.  VSS   Cristo Ausburn A, PA-C 03/12/22 1320

## 2022-03-12 NOTE — ED Triage Notes (Signed)
Pt states had Gastric Bypass on 12/26  Reports N/V that started today  Pt also reports chest pain that started today   Got morphine at [facility] and Zofran with EMS, left [facility]BS from [place] and came here POV

## 2022-03-12 NOTE — ED Provider Notes (Signed)
[place] EMERGENCY DEPARTMENT Provider Note   CSN: [id number] Arrival date & time: 03/12/22  1705     History  Chief Complaint  Patient presents with   Post-op Problem    Yolanda Brown is a 62 y.o. female.  Patient is here with with nausea and vomiting that started today.  She had gastric bypass surgery at [facility] on 1226.  She has been mostly on clear liquid diet up until yesterday when she had her first  ensure.  Pain started after having her Ensure protein shake today.  Symptoms have improved.  She was concern for may be a bowel obstruction.  She is having some cramping pain and nausea and vomiting but resolved.  Still feels little bit nauseous.  Has not really had normal bowel movements.  Not passing much gas.  She left without being seen at [facility] and already had blood work and came here.  She got morphine she says at [facility].  She is not having any chest pain or shortness of breath.  The history is provided by the patient.       Home Medications Prior to Admission medications   Medication Sig Start Date End Date Taking? Authorizing Provider  ALPRAZolam (XANAX) 0.5 MG tablet take 1 TABLET BY MOUTH TWICE DAILY AS NEEDED for anxiety Patient taking differently: Take 0.5 mg by mouth 2 (two) times daily as needed for anxiety.  11/13/16   Tysinger, Camelia Eng, PA-C  amLODipine (NORVASC) 5 MG tablet Take 1 tablet (5 mg total) by mouth daily. Please make overdue appt with Dr. Marlou Porch before anymore refills. Thank you 3rd and Final Attempt 01/21/21   Jerline Pain, MD  metFORMIN (GLUCOPHAGE) 1000 MG tablet Take 1,000 mg by mouth 2 (two) times daily with a meal.    [provider]  metoprolol succinate (TOPROL-XL) 50 MG 24 hr tablet TAKE 1 TAB DAILY. MAY TAKE EXTRA TAB FOR SVT EPISODE AS NEEDED. PLEASE MAKE OVERDUE APPT- DR. Marlou Porch 09/22/19   Jerline Pain, MD  metroNIDAZOLE (FLAGYL) 500 MG tablet Take 1 tablet (500 mg total) by mouth 2 (two) times daily. 05/02/21    Teodora Medici, FNP  Multiple Vitamin (MULITIVITAMIN WITH MINERALS) TABS Take 1 tablet by mouth every morning.     [provider]  Omeprazole-Sodium Bicarbonate (ZEGERID OTC PO) Take 1 tablet by mouth daily.    [provider]  ondansetron (ZOFRAN) 4 MG tablet Take 1 tablet (4 mg total) by mouth every 6 (six) hours as needed for nausea. 09/21/19   Guilford Shi, MD  triamterene-hydrochlorothiazide (MAXZIDE-25) 37.5-25 MG tablet TAKE 1 TABLET BY MOUTH DAILY. PLEASE MAKE OVERDUE APPT WITH DR. Marlou Porch OR REFILL WITH PCP BEFORE ANYMORE REFILLS. 3RD AND FINAL ATTEMPT 03/04/20   Jerline Pain, MD      Allergies    Aspirin    Review of Systems   Review of Systems  Physical Exam Updated Vital Signs BP (!) 172/104   Pulse 89   Temp 99.4 F (37.4 C)   Resp 20   Ht 5\' 6"  (1.676 m)   Wt 115.6 kg   SpO2 (!) 89%   BMI 41.13 kg/m  Physical Exam Vitals and nursing note reviewed.  Constitutional:      General: She is not in acute distress.    Appearance: She is well-developed. She is not ill-appearing.  HENT:     Head: Normocephalic and atraumatic.     Nose: Nose normal.  Mouth/Throat:     Mouth: Mucous membranes are moist.  Eyes:     Extraocular Movements: Extraocular movements intact.     Conjunctiva/sclera: Conjunctivae normal.     Pupils: Pupils are equal, round, and reactive to light.  Cardiovascular:     Rate and Rhythm: Normal rate and regular rhythm.     Heart sounds: No murmur heard. Pulmonary:     Effort: Pulmonary effort is normal. No respiratory distress.     Breath sounds: Normal breath sounds.  Abdominal:     Palpations: Abdomen is soft.     Tenderness: There is abdominal tenderness.     Comments: Mild tenderness diffusely, surgical sites look clean dry and intact  Musculoskeletal:        General: No swelling.     Cervical back: Normal range of motion and neck supple.  Skin:    General: Skin is warm and dry.     Capillary Refill: Capillary  refill takes less than 2 seconds.  Neurological:     Mental Status: She is alert.  Psychiatric:        Mood and Affect: Mood normal.     ED Results / Procedures / Treatments   Labs (all labs ordered are listed, but only abnormal results are displayed) Labs Reviewed - No data to display  EKG None  Radiology CT ABDOMEN PELVIS W CONTRAST  Result Date: 03/12/2022 CLINICAL DATA:  Abdominal pain.  Previous gastric bypass 03/01/2022 EXAM: CT ABDOMEN AND PELVIS WITH CONTRAST TECHNIQUE: Multidetector CT imaging of the abdomen and pelvis was performed using the standard protocol following bolus administration of intravenous contrast. RADIATION DOSE REDUCTION: This exam was performed according to the departmental dose-optimization program which includes automated exposure control, adjustment of the mA and/or kV according to patient size and/or use of iterative reconstruction technique. CONTRAST:  OMNIPAQUE IOHEXOL 300 MG/ML  SOLN COMPARISON:  CT 12/01/2019 and older FINDINGS: Lower chest: There is linear opacity lung bases likely scar or atelectasis. No pleural effusion. Breathing motion. Small hiatal hernia. Hepatobiliary: Fatty liver infiltration identified. Previous cholecystectomy. There is fluid in the gallbladder fossa which is new from previous. There is also some dilatation of the common duct with diameter on coronal imaging approaching 12 mL. This was seen on the prior examination as well chronic. Pancreas: Moderate global atrophy of the pancreas, unchanged from previous. Spleen: Spleen is not enlarged. Adrenals/Urinary Tract: Adrenal glands are preserved. Normal renal enhancement. No obvious mass lesion. No collecting system dilatation. Preserved contours of the urinary bladder. Stomach/Bowel: On this non oral contrast exam, the large bowel has a normal course and caliber with diffuse scattered colonic diverticula most confluent in the sigmoid region. Normal retrocecal appendix. Surgical  changes from gastric sleeve with suture longitudinal along the greater curve of the stomach. The stomach is nondilated. Distal stomach has some subtle wall thickening with underdistention. There is some stranding identified extending towards the area of the distal stomach and proximal duodenal. There is also surgical changes in this location. Please correlate for a surgical history of additional anastomosis between a loop ileum and the proximal duodenum. There is wall thickening of the bowel loops in this location including the proximal duodenum and adjacent small bowel. No bowel dilatation. There is some lobular areas of intermediate soft tissue type density in this location such as axial image 39 of series 2 measuring 2.6 by 1.9 cm. Smaller focus just caudal and lateral. This easily could represent areas of hematoma. In principle a mass lesion or  lymph node is not excluded on the basis of this single exam. Other portions of small bowel are nondilated. Vascular/Lymphatic: Normal caliber aorta and IVC. Mild vascular calcifications are identified. No additional areas of abnormal uptake large mint present in the abdomen and pelvis. There are some small nodes in the pre cardiophrenic space. Reproductive: Status post hysterectomy. No adnexal masses. Other: No areas of free air. There is focal soft tissue thickening in the area of the umbilicus and anterior pelvic wall in the midline. Scattered subcutaneous fat stranding. Musculoskeletal: Slight curvature of the lumbar spine. Scattered degenerative changes of the spine and pelvis. IMPRESSION: Surgical changes from presumed recent gastric sleeve procedure. In addition there is suture along the second portion of the duodenum and along some adjacent loops of small bowel. These loops show some wall thickening with adjacent stranding. There is also some lobular soft tissue density immediately anterior to the second portion of the duodenal. Differential would include some  areas of hematoma. A soft tissue lesion or lymph node is in the differential on the basis of this single exam. there is also some fluid along the gallbladder fossa. Please correlate with the exact surgical history. No other well-defined fluid collections at this time. Remote previous cholecystectomy noted comparing to prior examinations. Stable ectasia of the biliary tree. Small hiatal hernia. Colonic diverticula. Case discussed with the ordering service on 03/12/2022 at 6:45 p.m, Dr. Ronnald Nian. [place] standard time by myself Electronically Signed   By: Jill Side M.D.   On: 03/12/2022 18:51    Procedures Procedures    Medications Ordered in ED Medications  ondansetron (ZOFRAN) injection 4 mg (4 mg Intravenous Given 03/12/22 1917)  sodium chloride 0.9 % bolus 1,000 mL (1,000 mLs Intravenous New Bag/Given 03/12/22 1959)    ED Course/ Medical Decision Making/ A&P                           Medical Decision Making Amount and/or Complexity of Data Reviewed Radiology: ordered.  Risk Prescription drug management.   Eldred B Pano is here with abdominal pain and nausea and vomiting.  Normal vitals.  No fever.  She had single anastomosis of duodenal ileal gastric sleeve at [facility] about 10 days ago.  She has been on a clear liquid diet with feeling well until she started taking protein shakes yesterday evening and this morning.  After drinking protein shake this morning she had nausea and vomiting and abdominal cramping that has since resolved.  She has had blood work done at other emergency department which she left and then came here.  She has a little bit of a white count of 15 but otherwise lab works unremarkable.  She is well-appearing.  Her surgical site is clean dry and intact.  She does not have a lot of tenderness on abdominal exam.  CT scan pursued here per radiology report who also called me on the phone and there is no obvious acute findings.  There is no bowel obstruction.  There is may be a  small hematoma around the surgical site but otherwise CT scan unremarkable and expected postop changes.  My suspicion is may be this is pain related to now introducing some protein there may be some ileus.  I talked with Dr. Osvaldo Human with bariatric surgery team at [facility] and overall patient clinically appears to be doing well with no major findings on CT scan.  They will schedule her for close follow-up appointment tomorrow.  Patient  has both Zofran and Reglan to use at home.  She was able to successfully p.o. challenge here after some IV fluids and IV Zofran.  She understands return precautions and was discharged in the ED in good condition.  This chart was dictated using voice recognition software.  Despite best efforts to proofread,  errors can occur which can change the documentation meaning.         Final Clinical Impression(s) / ED Diagnoses Final diagnoses:  Post-operative pain    Rx / DC Orders ED Discharge Orders     None         Lennice Sites, DO 03/12/22 2124

## 2022-03-20 ENCOUNTER — Ambulatory Visit
Admission: EM | Admit: 2022-03-20 | Discharge: 2022-03-20 | Disposition: A | Discharge status: Home / Self Care | Payer: Self-pay

## 2022-03-20 DIAGNOSIS — I10 Essential (primary) hypertension: Secondary | ICD-10-CM

## 2022-03-20 DIAGNOSIS — R519 Headache, unspecified: Secondary | ICD-10-CM

## 2022-03-20 DIAGNOSIS — Z9889 Other specified postprocedural states: Secondary | ICD-10-CM

## 2022-03-20 NOTE — ED Triage Notes (Signed)
[facility] with [facility] of high bp since yesterday. Pt reports left arm 169/123 Right arm 185/115  Pt reports recent gastric sleeve on 12/26.   Pt reports she takes losartin 50 and mentopolol 50 and, she took an extra metoprolol today to see if it would help

## 2022-03-20 NOTE — ED Provider Notes (Signed)
[facility]  Note:  This document was prepared using Dragon voice recognition software and may include unintentional dictation errors.  MRN: [id number] DOB: 05/12/1960  Subjective:   Yolanda Brown is a 62 y.o. female presenting for check on her blood pressure.  Patient has had persistently elevated blood pressure readings since yesterday.  Has had them in the 409W to 119J systolic and greater than 478G diastolic.  She was advised by her surgeon who recently did a gastric sleeve on 1226 to come in and be checked by [place].  She contacted her PCP as well who agreed.  Patient has had a persistent right-sided headache since her surgery.  She has felt weak at times but reports that it is likely related to not being able to eat or drink as much since her surgery.  Does not have any active chest pain, shortness of breath, numbness or tingling, vision changes.  No current facility-administered medications for this encounter.  Current Outpatient Medications:    losartan (COZAAR) 50 MG tablet, Take 50 mg by mouth daily., Disp: , Rfl:    ALPRAZolam (XANAX) 0.5 MG tablet, take 1 TABLET BY MOUTH TWICE DAILY AS NEEDED for anxiety (Patient taking differently: Take 0.5 mg by mouth 2 (two) times daily as needed for anxiety. ), Disp: 20 tablet, Rfl: 0   amLODipine (NORVASC) 5 MG tablet, Take 1 tablet (5 mg total) by mouth daily. Please make overdue appt with Dr. Marlou Porch before anymore refills. Thank you 3rd and Final Attempt (Patient not taking: Reported on 03/20/2022), Disp: 15 tablet, Rfl: 0   metFORMIN (GLUCOPHAGE) 1000 MG tablet, Take 1,000 mg by mouth 2 (two) times daily with a meal., Disp: , Rfl:    metoprolol succinate (TOPROL-XL) 50 MG 24 hr tablet, TAKE 1 TAB DAILY. MAY TAKE EXTRA TAB FOR SVT EPISODE AS NEEDED. PLEASE MAKE OVERDUE APPT- DR. Marlou Porch, Disp: 30 tablet, Rfl: 11   metroNIDAZOLE (FLAGYL) 500 MG tablet, Take 1 tablet (500 mg total) by mouth 2 (two) times daily. (Patient not taking: Reported  on 03/20/2022), Disp: 14 tablet, Rfl: 0   Multiple Vitamin (MULITIVITAMIN WITH MINERALS) TABS, Take 1 tablet by mouth every morning. , Disp: , Rfl:    Omeprazole-Sodium Bicarbonate (ZEGERID OTC PO), Take 1 tablet by mouth daily., Disp: , Rfl:    ondansetron (ZOFRAN) 4 MG tablet, Take 1 tablet (4 mg total) by mouth every 6 (six) hours as needed for nausea., Disp: 20 tablet, Rfl: 0   triamterene-hydrochlorothiazide (MAXZIDE-25) 37.5-25 MG tablet, TAKE 1 TABLET BY MOUTH DAILY. PLEASE MAKE OVERDUE APPT WITH DR. Marlou Porch OR REFILL WITH PCP BEFORE ANYMORE REFILLS. 3RD AND FINAL ATTEMPT, Disp: 15 tablet, Rfl: 0   Allergies  Allergen Reactions   Aspirin Hives    Past Medical History:  Diagnosis Date   Allergy    Anxiety 2013   has seen Dr. Toy Care prior   Barrett esophagus    Depression 2013   Dr. Toy Care prior   Diverticulitis 06/2013   3 total episodes   DVT (deep vein thrombosis) in pregnancy    GERD (gastroesophageal reflux disease)    Hypertension    Paroxysmal SVT (supraventricular tachycardia)    Echo 8/19:  EF 55-60, Gr 1 DD, normal RVSF   Vitamin D deficiency      Past Surgical History:  Procedure Laterality Date   ABDOMINAL HYSTERECTOMY     fibroids, still has ovaries   BREAST BIOPSY Left 2012   stereo/neg   CHOLECYSTECTOMY  COLONOSCOPY  2011   [place], [place]; polyp and diverticulosis   gastric bypass     WISDOM TOOTH EXTRACTION      Family History  Problem Relation Age of Onset   Diabetes Mother    Heart disease Mother    Cancer Father        prostate, mets   Diabetes Father    Hypertension Sister    Diabetes Sister    Stroke Sister    Hypertension Brother    Obesity Brother    Hypertension Brother    Other Brother        died of sepsis s/p perforated bowel   Obesity Brother    Heart disease Sister    Hypertension Sister    Diabetes Sister    Hypertension Other    Diabetes Other    Cancer Paternal Uncle        14 uncles all died with cancer, various types     Social History   Tobacco Use   Smoking status: Never   Smokeless tobacco: Never  Vaping Use   Vaping Use: Never used  Substance Use Topics   Alcohol use: Yes    Alcohol/week: 2.0 standard drinks of alcohol    Types: 2 Glasses of wine per week    Comment: rare   Drug use: No    ROS   Objective:   Vitals: BP 133/88   Pulse (!) 57   Temp 97.9 F (36.6 C) (Oral)   Resp 20   SpO2 91%   BP Readings from Last 3 Encounters:  03/20/22 133/88  03/12/22 (!) 172/104  03/12/22 (!) 160/88   Pulse oximetry was 96-97% on recheck.   Physical Exam Constitutional:      General: She is not in acute distress.    Appearance: Normal appearance. She is well-developed. She is not ill-appearing, toxic-appearing or diaphoretic.  HENT:     Head: Normocephalic and atraumatic.     Nose: Nose normal.     Mouth/Throat:     Mouth: Mucous membranes are moist.  Eyes:     General: No scleral icterus.       Right eye: No discharge.        Left eye: No discharge.     Extraocular Movements: Extraocular movements intact.  Neck:     Vascular: No carotid bruit.  Cardiovascular:     Rate and Rhythm: Normal rate and regular rhythm.     Heart sounds: Normal heart sounds. No murmur heard.    No friction rub. No gallop.  Pulmonary:     Effort: Pulmonary effort is normal. No respiratory distress.     Breath sounds: No stridor. No wheezing, rhonchi or rales.  Chest:     Chest wall: No tenderness.  Musculoskeletal:     Cervical back: Normal range of motion and neck supple.  Skin:    General: Skin is warm and dry.  Neurological:     General: No focal deficit present.     Mental Status: She is alert and oriented to person, place, and time.     Cranial Nerves: No cranial nerve deficit, dysarthria or facial asymmetry.     Motor: No weakness or pronator drift.     Coordination: Romberg sign negative. Coordination normal.     Gait: Gait normal.     Deep Tendon Reflexes: Reflexes normal.      Comments: Negative Romberg and pronator drift.  Psychiatric:        Mood and Affect: Mood  normal.        Behavior: Behavior normal.        Thought Content: Thought content normal.        Judgment: Judgment normal.     Assessment and Plan :   PDMP not reviewed this encounter.  1. Essential hypertension   2. Right-sided headache   3. History of gastric surgery    Patient has reassuring physical exam findings, no signs of an acute encephalopathy.  Her blood pressure and pulse oximetry are fantastic in the clinic.  I reviewed this including ER precautions for an acute encephalopathy or an acute cardiopulmonary event.  Patient plans on following up with her PCP and surgeon as soon as possible.  Recommended maintaining her current blood pressure regimen.  Counseled patient on potential for adverse effects with medications prescribed/recommended today, ER and return-to-clinic precautions discussed, patient verbalized understanding.    Wallis Bamberg, [place] 03/20/22 1828

## 2022-03-24 DIAGNOSIS — E860 Dehydration: Secondary | ICD-10-CM | POA: Insufficient documentation

## 2022-07-31 ENCOUNTER — Emergency Department (HOSPITAL_BASED_OUTPATIENT_CLINIC_OR_DEPARTMENT_OTHER): Payer: Managed Care, Other (non HMO)

## 2022-07-31 ENCOUNTER — Encounter (HOSPITAL_BASED_OUTPATIENT_CLINIC_OR_DEPARTMENT_OTHER): Payer: Self-pay

## 2022-07-31 ENCOUNTER — Other Ambulatory Visit: Payer: Self-pay

## 2022-07-31 ENCOUNTER — Emergency Department (HOSPITAL_BASED_OUTPATIENT_CLINIC_OR_DEPARTMENT_OTHER)
Admission: EM | Admit: 2022-07-31 | Discharge: 2022-07-31 | Disposition: A | Discharge status: Home / Self Care | Payer: Managed Care, Other (non HMO) | Attending: Emergency Medicine | Admitting: Emergency Medicine

## 2022-07-31 DIAGNOSIS — R1032 Left lower quadrant pain: Secondary | ICD-10-CM | POA: Diagnosis present

## 2022-07-31 DIAGNOSIS — K5792 Diverticulitis of intestine, part unspecified, without perforation or abscess without bleeding: Secondary | ICD-10-CM

## 2022-07-31 LAB — COMPREHENSIVE METABOLIC PANEL
ALT: 20 U/L (ref 0–44)
AST: 21 U/L (ref 15–41)
Albumin: 4.1 g/dL (ref 3.5–5.0)
Alkaline Phosphatase: 61 U/L (ref 38–126)
Anion gap: 10 (ref 5–15)
BUN: 22 mg/dL (ref 8–23)
CO2: 24 mmol/L (ref 22–32)
Calcium: 9.5 mg/dL (ref 8.9–10.3)
Chloride: 104 mmol/L (ref 98–111)
Creatinine, Ser: 0.68 mg/dL (ref 0.44–1.00)
GFR, Estimated: >60 mL/min (ref >60)
Glucose, Bld: 95 mg/dL (ref 70–99)
Potassium: 3.8 mmol/L (ref 3.5–5.1)
Sodium: 138 mmol/L (ref 135–145)
Total Bilirubin: 0.3 mg/dL (ref 0.3–1.2)
Total Protein: 8.2 g/dL — ABNORMAL HIGH (ref 6.5–8.1)

## 2022-07-31 LAB — CBC WITH DIFFERENTIAL/PLATELET
Abs Immature Granulocytes: 0.02 10*3/uL (ref 0.00–0.07)
Basophils Absolute: 0.1 10*3/uL (ref 0.0–0.1)
Basophils Relative: 1 %
Eosinophils Absolute: 0.4 10*3/uL (ref 0.0–0.5)
Eosinophils Relative: 4 %
HCT: 40.1 % (ref 36.0–46.0)
Hemoglobin: 13.3 g/dL (ref 12.0–15.0)
Immature Granulocytes: 0 %
Lymphocytes Relative: 44 %
Lymphs Abs: 4.1 10*3/uL — ABNORMAL HIGH (ref 0.7–4.0)
MCH: 27.8 pg (ref 26.0–34.0)
MCHC: 33.2 g/dL (ref 30.0–36.0)
MCV: 83.7 fL (ref 80.0–100.0)
Monocytes Absolute: 0.7 10*3/uL (ref 0.1–1.0)
Monocytes Relative: 7 %
Neutro Abs: 4.2 10*3/uL (ref 1.7–7.7)
Neutrophils Relative %: 44 %
Platelets: 212 10*3/uL (ref 150–400)
RBC: 4.79 MIL/uL (ref 3.87–5.11)
RDW: 15.4 % (ref 11.5–15.5)
WBC: 9.3 10*3/uL (ref 4.0–10.5)
nRBC: 0 % (ref 0.0–0.2)

## 2022-07-31 LAB — URINALYSIS, W/ REFLEX TO CULTURE (INFECTION SUSPECTED)
Bacteria, UA: NONE SEEN
Bilirubin Urine: NEGATIVE
Glucose, UA: NEGATIVE mg/dL
Hgb urine dipstick: NEGATIVE
Ketones, ur: NEGATIVE mg/dL
Leukocytes,Ua: NEGATIVE
Nitrite: NEGATIVE
Specific Gravity, Urine: >1.046 — ABNORMAL HIGH (ref 1.005–1.030)
pH: 6 (ref 5.0–8.0)

## 2022-07-31 LAB — LIPASE, BLOOD: Lipase: 32 U/L (ref 11–51)

## 2022-07-31 MED ORDER — AMOXICILLIN-POT CLAVULANATE 875-125 MG PO TABS: 1.0000 | ORAL_TABLET | Freq: Two times a day (BID) | ORAL | 0 refills | Status: DC

## 2022-07-31 MED ORDER — IOHEXOL 300 MG/ML  SOLN
100.0000 mL | Freq: Once | INTRAMUSCULAR | Status: AC | PRN
  Administered 2022-07-31: 80 mL via INTRAVENOUS

## 2022-07-31 MED ORDER — SODIUM CHLORIDE 0.9 % IV BOLUS
1000.0000 mL | Freq: Once | INTRAVENOUS | Status: AC
  Administered 2022-07-31: 1000 mL via INTRAVENOUS

## 2022-07-31 MED ORDER — ONDANSETRON HCL 4 MG/2ML IJ SOLN
4.0000 mg | Freq: Once | INTRAMUSCULAR | Status: AC
  Administered 2022-07-31: 4 mg via INTRAVENOUS
  Filled 2022-07-31: qty 2

## 2022-07-31 MED ORDER — FENTANYL CITRATE PF 50 MCG/ML IJ SOSY
50.0000 ug | PREFILLED_SYRINGE | Freq: Once | INTRAMUSCULAR | Status: AC
  Administered 2022-07-31: 50 ug via INTRAVENOUS
  Filled 2022-07-31: qty 1

## 2022-07-31 NOTE — ED Provider Notes (Signed)
[place] EMERGENCY DEPARTMENT AT [facility]  Provider Note  CSN: [id number] Arrival date & time: 07/31/22 0503  History Chief Complaint  Patient presents with   Abdominal Pain    Yolanda Brown is a 62 y.o. female with prior history of diverticulitis has also had gastric bypass surgery about 6 months ago. She presents for evaluation of about 24 hours of persistent LLQ abdominal pain, associated with nausea, no vomiting or fever. No diarrhea or rectal bleeding.   Home Medications Prior to Admission medications   Medication Sig Start Date End Date Taking? Authorizing Provider  ALPRAZolam (XANAX) 0.5 MG tablet take 1 TABLET BY MOUTH TWICE DAILY AS NEEDED for anxiety Patient taking differently: Take 0.5 mg by mouth 2 (two) times daily as needed for anxiety.  11/13/16   Tysinger, Kermit Balo, PA-C  amLODipine (NORVASC) 5 MG tablet Take 1 tablet (5 mg total) by mouth daily. Please make overdue appt with Dr. Anne Fu before anymore refills. Thank you 3rd and Final Attempt Patient not taking: Reported on 03/20/2022 01/21/21   Jake Bathe, MD  losartan (COZAAR) 50 MG tablet Take 50 mg by mouth daily.    [provider]  metFORMIN (GLUCOPHAGE) 1000 MG tablet Take 1,000 mg by mouth 2 (two) times daily with a meal.    [provider]  metoprolol succinate (TOPROL-XL) 50 MG 24 hr tablet TAKE 1 TAB DAILY. MAY TAKE EXTRA TAB FOR SVT EPISODE AS NEEDED. PLEASE MAKE OVERDUE APPT- DR. Anne Fu 09/22/19   Jake Bathe, MD  metroNIDAZOLE (FLAGYL) 500 MG tablet Take 1 tablet (500 mg total) by mouth 2 (two) times daily. Patient not taking: Reported on 03/20/2022 05/02/21   Gustavus Bryant, FNP  Multiple Vitamin (MULITIVITAMIN WITH MINERALS) TABS Take 1 tablet by mouth every morning.     [provider]  Omeprazole-Sodium Bicarbonate (ZEGERID OTC PO) Take 1 tablet by mouth daily.    [provider]  ondansetron (ZOFRAN) 4 MG tablet Take 1 tablet (4 mg total) by mouth every  6 (six) hours as needed for nausea. 09/21/19   Alessandra Bevels, MD  triamterene-hydrochlorothiazide (MAXZIDE-25) 37.5-25 MG tablet TAKE 1 TABLET BY MOUTH DAILY. PLEASE MAKE OVERDUE APPT WITH DR. Anne Fu OR REFILL WITH PCP BEFORE ANYMORE REFILLS. 3RD AND FINAL ATTEMPT 03/04/20   Jake Bathe, MD     Allergies    Aspirin   Review of Systems   Review of Systems Please see HPI for pertinent positives and negatives  Physical Exam BP 123/75   Pulse 63   Temp 98.1 F (36.7 C) (Oral)   Resp (!) 21   SpO2 92%   Physical Exam Vitals and nursing note reviewed.  Constitutional:      Appearance: Normal appearance.  HENT:     Head: Normocephalic and atraumatic.     Nose: Nose normal.     Mouth/Throat:     Mouth: Mucous membranes are moist.  Eyes:     Extraocular Movements: Extraocular movements intact.     Conjunctiva/sclera: Conjunctivae normal.  Cardiovascular:     Rate and Rhythm: Normal rate.  Pulmonary:     Effort: Pulmonary effort is normal.     Breath sounds: Normal breath sounds.  Abdominal:     General: Abdomen is flat.     Palpations: Abdomen is soft.     Tenderness: There is abdominal tenderness in the left lower quadrant. There is no guarding. Negative signs include Murphy's sign and McBurney's sign.  Musculoskeletal:  General: No swelling. Normal range of motion.     Cervical back: Neck supple.  Skin:    General: Skin is warm and dry.  Neurological:     General: No focal deficit present.     Mental Status: She is alert.  Psychiatric:        Mood and Affect: Mood normal.     ED Results / Procedures / Treatments   EKG None  Procedures Procedures  Medications Ordered in the ED Medications  fentaNYL (SUBLIMAZE) injection 50 mcg (50 mcg Intravenous Given 07/31/22 0542)  ondansetron (ZOFRAN) injection 4 mg (4 mg Intravenous Given 07/31/22 0541)  sodium chloride 0.9 % bolus 1,000 mL (1,000 mLs Intravenous New Bag/Given 07/31/22 0553)  iohexol  (OMNIPAQUE) 300 MG/ML solution 100 mL (80 mLs Intravenous Contrast Given 07/31/22 0637)    Initial Impression and Plan  Patient here with LLQ pain, similar to prior diverticulitis. Will check labs and send for CT to eval complications. Pain/nausea meds for comfort.   ED Course   Clinical Course as of 07/31/22 0705  Fri Jul 31, 2022  0604 CBC is normal.  [CS]  0622 CMP and lipase are normal.  [CS]  0703 Care of the patient signed out to Dr. Jodi Mourning at the change of shift.  [CS]    Clinical Course User Index [CS] Pollyann Savoy, MD     MDM Rules/Calculators/A&P Medical Decision Making Problems Addressed: LLQ abdominal pain: acute illness or injury  Amount and/or Complexity of Data Reviewed Labs: ordered. Decision-making details documented in ED Course. Radiology: ordered.  Risk Prescription drug management. Parenteral controlled substances.     Final Clinical Impression(s) / ED Diagnoses Final diagnoses:  LLQ abdominal pain    Rx / DC Orders ED Discharge Orders     None        Pollyann Savoy, MD 07/31/22 [phone]

## 2022-07-31 NOTE — Discharge Instructions (Addendum)
Take antibiotics as prescribed.  Follow-up with your doctor after the weekend if any worsening signs or symptoms.  Use Zofran every 6 hours needed for nausea and vomiting.  Use Tylenol every 4 hours needed for pain. Return for right lower abdominal pain that is persistent or fevers.

## 2022-07-31 NOTE — ED Triage Notes (Signed)
Pt. C/o llq abd pain x 24hrs. States hx of diverticulitis and says this pain feels the same

## 2022-07-31 NOTE — ED Provider Notes (Signed)
Patient care signed out to follow-up CT scan results for final disposition.  CT scan results show diverticulitis which is consistent with her left lower quadrant pain and history of similar.  CT scan did find enlarged appendix however patient has no pain in that region.  Discussed reasons to return supportive care, oral antibiotics.  Patient comfortable plan.  Kenton Kingfisher, MD 07/31/22 [phone]

## 2022-10-10 ENCOUNTER — Emergency Department (HOSPITAL_BASED_OUTPATIENT_CLINIC_OR_DEPARTMENT_OTHER)
Admission: EM | Admit: 2022-10-10 | Discharge: 2022-10-11 | Disposition: A | Discharge status: Home / Self Care | Payer: Managed Care, Other (non HMO) | Attending: Emergency Medicine | Admitting: Emergency Medicine

## 2022-10-10 ENCOUNTER — Encounter (HOSPITAL_BASED_OUTPATIENT_CLINIC_OR_DEPARTMENT_OTHER): Payer: Self-pay

## 2022-10-10 DIAGNOSIS — R112 Nausea with vomiting, unspecified: Secondary | ICD-10-CM | POA: Insufficient documentation

## 2022-10-10 DIAGNOSIS — R197 Diarrhea, unspecified: Secondary | ICD-10-CM | POA: Insufficient documentation

## 2022-10-10 DIAGNOSIS — R1031 Right lower quadrant pain: Secondary | ICD-10-CM | POA: Insufficient documentation

## 2022-10-10 DIAGNOSIS — Z79899 Other long term (current) drug therapy: Secondary | ICD-10-CM | POA: Insufficient documentation

## 2022-10-10 DIAGNOSIS — I10 Essential (primary) hypertension: Secondary | ICD-10-CM | POA: Insufficient documentation

## 2022-10-10 LAB — CBC WITH DIFFERENTIAL/PLATELET
Abs Immature Granulocytes: 0.03 10*3/uL (ref 0.00–0.07)
Basophils Absolute: 0.1 10*3/uL (ref 0.0–0.1)
Basophils Relative: 1 %
Eosinophils Absolute: 0.3 10*3/uL (ref 0.0–0.5)
Eosinophils Relative: 4 %
HCT: 40.9 % (ref 36.0–46.0)
Hemoglobin: 13.3 g/dL (ref 12.0–15.0)
Immature Granulocytes: 0 %
Lymphocytes Relative: 25 %
Lymphs Abs: 2.3 10*3/uL (ref 0.7–4.0)
MCH: 27.3 pg (ref 26.0–34.0)
MCHC: 32.5 g/dL (ref 30.0–36.0)
MCV: 83.8 fL (ref 80.0–100.0)
Monocytes Absolute: 0.5 10*3/uL (ref 0.1–1.0)
Monocytes Relative: 5 %
Neutro Abs: 6 10*3/uL (ref 1.7–7.7)
Neutrophils Relative %: 65 %
Platelets: 277 10*3/uL (ref 150–400)
RBC: 4.88 MIL/uL (ref 3.87–5.11)
RDW: 15.3 % (ref 11.5–15.5)
WBC: 9.1 10*3/uL (ref 4.0–10.5)
nRBC: 0 % (ref 0.0–0.2)

## 2022-10-10 MED ORDER — SODIUM CHLORIDE 0.9 % IV SOLN: INTRAVENOUS | Status: DC

## 2022-10-10 MED ORDER — ONDANSETRON HCL 4 MG/2ML IJ SOLN
4.0000 mg | Freq: Once | INTRAMUSCULAR | Status: AC
  Administered 2022-10-10: 4 mg via INTRAVENOUS
  Filled 2022-10-10: qty 2

## 2022-10-10 MED ORDER — SODIUM CHLORIDE 0.9 % IV BOLUS
1000.0000 mL | Freq: Once | INTRAVENOUS | Status: AC
  Administered 2022-10-10: 1000 mL via INTRAVENOUS

## 2022-10-10 MED ORDER — ONDANSETRON HCL 4 MG/2ML IJ SOLN
4.0000 mg | Freq: Once | INTRAMUSCULAR | Status: AC
  Administered 2022-10-11: 4 mg via INTRAVENOUS
  Filled 2022-10-10: qty 2

## 2022-10-10 MED ORDER — FENTANYL CITRATE PF 50 MCG/ML IJ SOSY
50.0000 ug | PREFILLED_SYRINGE | Freq: Once | INTRAMUSCULAR | Status: AC
  Administered 2022-10-10: 50 ug via INTRAVENOUS
  Filled 2022-10-10: qty 1

## 2022-10-10 NOTE — ED Triage Notes (Signed)
Pt POV from home reporting sharp R side abd pain that started today as well as vomiting that began tonight. No urinary sx.

## 2022-10-10 NOTE — ED Provider Notes (Signed)
[place] EMERGENCY DEPARTMENT AT [facility] Provider Note  CSN: [id number] Arrival date & time: 10/10/22 2321  Chief Complaint(s) No chief complaint on file.  HPI Yolanda Brown is a 62 y.o. female with a past medical history listed below including recurrent episodes of diverticulitis here for several days of right flank and abdominal discomfort.  Initially mild but became worse this evening and associated with nausea vomiting.  Patient also endorsing diarrhea earlier.  She denies any fevers or chills.  No urinary symptoms.  No prior history of renal stones.    Patient is status post cholecystectomy, and gastric bypass.  The history is provided by the patient.    Past Medical History Past Medical History:  Diagnosis Date   Allergy    Anxiety 2013   has seen Dr. Evelene Croon prior   Barrett esophagus    Depression 2013   Dr. Evelene Croon prior   Diverticulitis 06/2013   3 total episodes   DVT (deep vein thrombosis) in pregnancy    GERD (gastroesophageal reflux disease)    Hypertension    Paroxysmal SVT (supraventricular tachycardia)    Echo 8/19:  EF 55-60, Gr 1 DD, normal RVSF   Vitamin D deficiency    Patient Active Problem List   Diagnosis Date Noted   Diarrhea    Acute diverticulitis 09/15/2019   Obesity 08/06/2015   Impaired fasting blood sugar 08/06/2015   Routine general medical examination at a health care facility 08/06/2015   Vaccine counseling 08/06/2015   Screening for breast cancer 08/06/2015   Chronic fatigue 08/06/2015   Vitamin D deficiency 08/06/2015   Non-restorative sleep 08/06/2015   Snoring 08/06/2015   Daytime somnolence 08/06/2015   Anxiety and depression 01/25/2015   History of DVT (deep vein thrombosis) 01/25/2015   Essential hypertension 01/25/2015   Home Medication(s) Prior to Admission medications   Medication Sig Start Date End Date Taking? Authorizing Provider  ondansetron (ZOFRAN-ODT) 4 MG disintegrating tablet Take 1 tablet (4 mg total)  by mouth every 8 (eight) hours as needed for up to 3 days for nausea or vomiting. 10/11/22 10/14/22 Yes Yesica Kemler, Amadeo Garnet, MD  ALPRAZolam Prudy Feeler) 0.5 MG tablet take 1 TABLET BY MOUTH TWICE DAILY AS NEEDED for anxiety Patient taking differently: Take 0.5 mg by mouth 2 (two) times daily as needed for anxiety.  11/13/16   Tysinger, Kermit Balo, PA-C  amLODipine (NORVASC) 5 MG tablet Take 1 tablet (5 mg total) by mouth daily. Please make overdue appt with Dr. Anne Fu before anymore refills. Thank you 3rd and Final Attempt Patient not taking: Reported on 03/20/2022 01/21/21   Jake Bathe, MD  amoxicillin-clavulanate (AUGMENTIN) 875-125 MG tablet Take 1 tablet by mouth every 12 (twelve) hours. 07/31/22   Blane Ohara, MD  losartan (COZAAR) 50 MG tablet Take 50 mg by mouth daily.    [provider]  metFORMIN (GLUCOPHAGE) 1000 MG tablet Take 1,000 mg by mouth 2 (two) times daily with a meal.    [provider]  metoprolol succinate (TOPROL-XL) 50 MG 24 hr tablet TAKE 1 TAB DAILY. MAY TAKE EXTRA TAB FOR SVT EPISODE AS NEEDED. PLEASE MAKE OVERDUE APPT- DR. Anne Fu 09/22/19   Jake Bathe, MD  Multiple Vitamin (MULITIVITAMIN WITH MINERALS) TABS Take 1 tablet by mouth every morning.     [provider]  Omeprazole-Sodium Bicarbonate (ZEGERID OTC PO) Take 1 tablet by mouth daily.    [provider]  ondansetron (ZOFRAN) 4 MG tablet Take 1 tablet (4 mg total)  by mouth every 6 (six) hours as needed for nausea. 09/21/19   Alessandra Bevels, MD  triamterene-hydrochlorothiazide (MAXZIDE-25) 37.5-25 MG tablet TAKE 1 TABLET BY MOUTH DAILY. PLEASE MAKE OVERDUE APPT WITH DR. Anne Fu OR REFILL WITH PCP BEFORE ANYMORE REFILLS. 3RD AND FINAL ATTEMPT 03/04/20   Jake Bathe, MD                                                                                                                                    Allergies Aspirin  Review of Systems Review of Systems As noted in HPI  Physical  Exam Vital Signs  I have reviewed the triage vital signs BP 121/85   Pulse 81   Temp 98 F (36.7 C) (Oral)   Resp 18   Ht 5\' 6"  (1.676 m)   Wt 84.8 kg   SpO2 94%   BMI 30.18 kg/m   Physical Exam Vitals reviewed.  Constitutional:      General: She is not in acute distress.    Appearance: She is well-developed. She is not diaphoretic.  HENT:     Head: Normocephalic and atraumatic.     Right Ear: External ear normal.     Left Ear: External ear normal.     Nose: Nose normal.  Eyes:     General: No scleral icterus.    Conjunctiva/sclera: Conjunctivae normal.  Neck:     Trachea: Phonation normal.  Cardiovascular:     Rate and Rhythm: Normal rate and regular rhythm.  Pulmonary:     Effort: Pulmonary effort is normal. No respiratory distress.     Breath sounds: No stridor.  Abdominal:     General: There is no distension.     Tenderness: There is abdominal tenderness in the right lower quadrant. There is right CVA tenderness. There is no guarding or rebound.  Musculoskeletal:        General: Normal range of motion.     Cervical back: Normal range of motion.  Neurological:     Mental Status: She is alert and oriented to person, place, and time.  Psychiatric:        Behavior: Behavior normal.     ED Results and Treatments Labs (all labs ordered are listed, but only abnormal results are displayed) Labs Reviewed  COMPREHENSIVE METABOLIC PANEL - Abnormal; Notable for the following components:      Result Value   Glucose, Bld 128 (*)    Total Protein 8.3 (*)    Total Bilirubin 0.2 (*)    All other components within normal limits  URINALYSIS, W/ REFLEX TO CULTURE (INFECTION SUSPECTED) - Abnormal; Notable for the following components:   Ketones, ur TRACE (*)    Protein, ur 30 (*)    All other components within normal limits  LIPASE, BLOOD  CBC WITH DIFFERENTIAL/PLATELET  EKG  EKG Interpretation Date/Time:    Ventricular Rate:    PR Interval:    QRS Duration:    QT Interval:    QTC Calculation:   R Axis:      Text Interpretation:         Radiology CT Renal Stone Study  Result Date: 10/11/2022 CLINICAL DATA:  Abdominal/flank pain, stone suspected. Right side abdominal pain. Vomiting. EXAM: CT ABDOMEN AND PELVIS WITHOUT CONTRAST TECHNIQUE: Multidetector CT imaging of the abdomen and pelvis was performed following the standard protocol without IV contrast. RADIATION DOSE REDUCTION: This exam was performed according to the departmental dose-optimization program which includes automated exposure control, adjustment of the mA and/or kV according to patient size and/or use of iterative reconstruction technique. COMPARISON:  07/31/2022 FINDINGS: Lower chest: No acute abnormality.  Small hiatal hernia. Hepatobiliary: No focal liver abnormality is seen. Status post cholecystectomy. Stable prominence of the common bile duct, likely related to post cholecystectomy state. Pancreas: No focal abnormality or ductal dilatation. Spleen: No focal abnormality.  Normal size. Adrenals/Urinary Tract: No adrenal abnormality. No focal renal abnormality. No stones or hydronephrosis. Urinary bladder is unremarkable. Stomach/Bowel: Changes of prior gastric sleeve and duodenal bypass. Small bowel decompressed. Normal appendix. Diffuse colonic diverticulosis. No active diverticulitis. Vascular/Lymphatic: No evidence of aneurysm or adenopathy. Reproductive: Prior hysterectomy.  No adnexal masses. Other: No free fluid or free air. Musculoskeletal: No acute bony abnormality. IMPRESSION: Diffuse colonic diverticulosis.  No active diverticulitis. Postoperative changes in the stomach and duodenum, stable. No complicating feature. No acute findings in the abdomen or pelvis. Electronically Signed   By: Charlett Nose M.D.   On: 10/11/2022 00:20    Medications Ordered in ED Medications   sodium chloride 0.9 % bolus 1,000 mL (0 mLs Intravenous Stopped 10/11/22 0122)    And  0.9 %  sodium chloride infusion (has no administration in time range)  ondansetron (ZOFRAN) injection 4 mg (4 mg Intravenous Given 10/10/22 2338)  fentaNYL (SUBLIMAZE) injection 50 mcg (50 mcg Intravenous Given 10/10/22 2339)  ondansetron (ZOFRAN) injection 4 mg (4 mg Intravenous Given 10/11/22 0013)  hyoscyamine (LEVSIN SL) SL tablet 0.125 mg (0.125 mg Sublingual Given 10/11/22 0057)  ketorolac (TORADOL) 15 MG/ML injection 15 mg (15 mg Intravenous Given 10/11/22 0058)   Procedures Procedures  (including critical care time) Medical Decision Making / ED Course   Medical Decision Making Amount and/or Complexity of Data Reviewed Labs: ordered. Decision-making details documented in ED Course. Radiology: ordered and independent interpretation performed. Decision-making details documented in ED Course.  Risk Prescription drug management. Parenteral controlled substances. Decision regarding hospitalization.    Differential diagnoses considered  Right abdominal pain.  UTI/pyelonephritis, renal colic, appendicitis, diverticulitis, bowel obstruction/internal hernia.  Also considering gastroenteritis.  Patient started on IV fluids, antiemetics and IV pain medicine.  CBC without leukocytosis or anemia. Metabolic panel without significant electrolyte derangements or renal sufficiency.  Mild hyperglycemia without DKA.  No evidence of bili obstruction or pancreatitis. UA without evidence of infection or hematuria. CT stone study negative for any renal calculi.  Diffuse colonic diverticulosis without diverticulitis.  No appendicitis.  No obstruction or internal hernia.  Pain did not improve after the above.  She did require additional antiemetics.  Also given Toradol and Levsin. She reported mild improvement in symptoms and requesting to go home.    Final Clinical Impression(s) / ED Diagnoses Final diagnoses:   Nausea vomiting and diarrhea  RLQ abdominal pain   The patient appears reasonably screened and/or stabilized for discharge and  I doubt any other medical condition or other [facility] requiring further screening, evaluation, or treatment in the ED at this time. I have discussed the findings, Dx and Tx plan with the patient/family who expressed understanding and agree(s) with the plan. Discharge instructions discussed at length. The patient/family was given strict return precautions who verbalized understanding of the instructions. No further questions at time of discharge.  Disposition: Discharge  Condition: Good  ED Discharge Orders          Ordered    ondansetron (ZOFRAN-ODT) 4 MG disintegrating tablet  Every 8 hours PRN        10/11/22 0244             Follow Up: Barnie Mort, PA-C [address] [place] [address] [phone]  Call  to schedule an appointment for close follow up    This chart was dictated using voice recognition software.  Despite best efforts to proofread,  errors can occur which can change the documentation meaning.    Nira Conn, MD 10/11/22 [phone]

## 2022-10-11 ENCOUNTER — Emergency Department (HOSPITAL_BASED_OUTPATIENT_CLINIC_OR_DEPARTMENT_OTHER): Payer: Managed Care, Other (non HMO)

## 2022-10-11 DIAGNOSIS — R112 Nausea with vomiting, unspecified: Secondary | ICD-10-CM | POA: Diagnosis not present

## 2022-10-11 LAB — COMPREHENSIVE METABOLIC PANEL WITH GFR
ALT: 27 U/L (ref 0–44)
AST: 28 U/L (ref 15–41)
Albumin: 4.3 g/dL (ref 3.5–5.0)
Alkaline Phosphatase: 87 U/L (ref 38–126)
Anion gap: 10 (ref 5–15)
BUN: 17 mg/dL (ref 8–23)
CO2: 24 mmol/L (ref 22–32)
Calcium: 10 mg/dL (ref 8.9–10.3)
Chloride: 104 mmol/L (ref 98–111)
Creatinine, Ser: 0.87 mg/dL (ref 0.44–1.00)
GFR, Estimated: >60 mL/min (ref >60)
Glucose, Bld: 128 mg/dL — ABNORMAL HIGH (ref 70–99)
Potassium: 3.8 mmol/L (ref 3.5–5.1)
Sodium: 138 mmol/L (ref 135–145)
Total Bilirubin: 0.2 mg/dL — ABNORMAL LOW (ref 0.3–1.2)
Total Protein: 8.3 g/dL — ABNORMAL HIGH (ref 6.5–8.1)

## 2022-10-11 LAB — URINALYSIS, W/ REFLEX TO CULTURE (INFECTION SUSPECTED)
Bacteria, UA: NONE SEEN
Bilirubin Urine: NEGATIVE
Glucose, UA: NEGATIVE mg/dL
Hgb urine dipstick: NEGATIVE
Leukocytes,Ua: NEGATIVE
Nitrite: NEGATIVE
Protein, ur: 30 mg/dL — AB
Specific Gravity, Urine: 1.029 (ref 1.005–1.030)
pH: 7 (ref 5.0–8.0)

## 2022-10-11 LAB — LIPASE, BLOOD: Lipase: 45 U/L (ref 11–51)

## 2022-10-11 MED ORDER — HYOSCYAMINE SULFATE 0.125 MG SL SUBL
0.1250 mg | SUBLINGUAL_TABLET | Freq: Once | SUBLINGUAL | Status: AC
  Administered 2022-10-11: 0.125 mg via SUBLINGUAL
  Filled 2022-10-11: qty 1

## 2022-10-11 MED ORDER — KETOROLAC TROMETHAMINE 15 MG/ML IJ SOLN
15.0000 mg | Freq: Once | INTRAMUSCULAR | Status: AC
  Administered 2022-10-11: 15 mg via INTRAVENOUS
  Filled 2022-10-11: qty 1

## 2022-10-11 MED ORDER — ONDANSETRON 4 MG PO TBDP: 4.0000 mg | ORAL_TABLET | Freq: Three times a day (TID) | ORAL | 0 refills | Status: AC | PRN

## 2024-04-03 DIAGNOSIS — I340 Nonrheumatic mitral (valve) insufficiency: Secondary | ICD-10-CM | POA: Diagnosis not present

## 2024-04-03 DIAGNOSIS — I361 Nonrheumatic tricuspid (valve) insufficiency: Secondary | ICD-10-CM | POA: Diagnosis not present

## 2024-04-03 DIAGNOSIS — R079 Chest pain, unspecified: Secondary | ICD-10-CM | POA: Diagnosis not present

## 2024-04-07 ENCOUNTER — Other Ambulatory Visit: Payer: Self-pay

## 2024-04-07 DIAGNOSIS — K219 Gastro-esophageal reflux disease without esophagitis: Secondary | ICD-10-CM | POA: Insufficient documentation

## 2024-04-07 DIAGNOSIS — K5790 Diverticulosis of intestine, part unspecified, without perforation or abscess without bleeding: Secondary | ICD-10-CM | POA: Insufficient documentation

## 2024-04-07 DIAGNOSIS — B9689 Other specified bacterial agents as the cause of diseases classified elsewhere: Secondary | ICD-10-CM | POA: Insufficient documentation

## 2024-04-07 DIAGNOSIS — K2270 Barrett's esophagus without dysplasia: Secondary | ICD-10-CM | POA: Insufficient documentation

## 2024-04-07 DIAGNOSIS — N898 Other specified noninflammatory disorders of vagina: Secondary | ICD-10-CM | POA: Insufficient documentation

## 2024-04-07 DIAGNOSIS — B3731 Acute candidiasis of vulva and vagina: Secondary | ICD-10-CM | POA: Insufficient documentation

## 2024-04-07 DIAGNOSIS — K317 Polyp of stomach and duodenum: Secondary | ICD-10-CM | POA: Insufficient documentation

## 2024-04-07 DIAGNOSIS — I471 Supraventricular tachycardia, unspecified: Secondary | ICD-10-CM | POA: Insufficient documentation

## 2024-04-07 DIAGNOSIS — O2230 Deep phlebothrombosis in pregnancy, unspecified trimester: Secondary | ICD-10-CM | POA: Insufficient documentation

## 2024-04-07 DIAGNOSIS — T7840XA Allergy, unspecified, initial encounter: Secondary | ICD-10-CM | POA: Insufficient documentation

## 2024-04-11 ENCOUNTER — Encounter: Payer: Self-pay | Admitting: Cardiology

## 2024-04-11 ENCOUNTER — Ambulatory Visit: Admitting: Cardiology

## 2024-04-11 VITALS — BP 128/88 | HR 62 | Ht 66.0 in | Wt 200.6 lb

## 2024-04-11 DIAGNOSIS — I471 Supraventricular tachycardia, unspecified: Secondary | ICD-10-CM | POA: Diagnosis not present

## 2024-04-11 DIAGNOSIS — E7800 Pure hypercholesterolemia, unspecified: Secondary | ICD-10-CM

## 2024-04-11 DIAGNOSIS — R072 Precordial pain: Secondary | ICD-10-CM | POA: Diagnosis not present

## 2024-04-11 DIAGNOSIS — E1169 Type 2 diabetes mellitus with other specified complication: Secondary | ICD-10-CM | POA: Diagnosis not present

## 2024-04-11 DIAGNOSIS — I25118 Atherosclerotic heart disease of native coronary artery with other forms of angina pectoris: Secondary | ICD-10-CM

## 2024-04-11 DIAGNOSIS — I10 Essential (primary) hypertension: Secondary | ICD-10-CM

## 2024-04-11 MED ORDER — NITROGLYCERIN 0.4 MG SL SUBL: 0.4000 mg | SUBLINGUAL_TABLET | SUBLINGUAL | 1 refills | Status: DC | PRN

## 2024-04-11 MED ORDER — CLOPIDOGREL BISULFATE 75 MG PO TABS: 75.0000 mg | ORAL_TABLET | Freq: Every day | ORAL | 3 refills | Status: DC

## 2024-04-11 MED ORDER — ROSUVASTATIN CALCIUM 20 MG PO TABS: 20.0000 mg | ORAL_TABLET | Freq: Every day | ORAL | 3 refills | Status: DC

## 2024-04-11 MED ORDER — ISOSORBIDE MONONITRATE ER 30 MG PO TB24: 30.0000 mg | ORAL_TABLET | Freq: Every day | ORAL | 3 refills | Status: DC

## 2024-04-12 ENCOUNTER — Encounter: Payer: Self-pay | Admitting: Cardiology

## 2024-04-12 MED ORDER — ISOSORBIDE MONONITRATE ER 30 MG PO TB24: 30.0000 mg | ORAL_TABLET | Freq: Every day | ORAL | 3 refills | Status: AC

## 2024-04-12 MED ORDER — CLOPIDOGREL BISULFATE 75 MG PO TABS: 75.0000 mg | ORAL_TABLET | Freq: Every day | ORAL | 3 refills | Status: AC

## 2024-04-12 MED ORDER — NITROGLYCERIN 0.4 MG SL SUBL: 0.4000 mg | SUBLINGUAL_TABLET | SUBLINGUAL | 1 refills | Status: AC | PRN

## 2024-04-12 MED ORDER — ROSUVASTATIN CALCIUM 20 MG PO TABS: 20.0000 mg | ORAL_TABLET | Freq: Every day | ORAL | 3 refills | Status: AC

## 2024-04-13 ENCOUNTER — Encounter: Payer: Self-pay | Admitting: Cardiology

## 2024-04-26 ENCOUNTER — Ambulatory Visit (HOSPITAL_COMMUNITY)

## 2024-05-23 ENCOUNTER — Ambulatory Visit: Admitting: Pharmacist Clinician (PhC)/ Clinical Pharmacy Specialist
